# Patient Record
Sex: Female | Born: 1955 | Race: White | Hispanic: No | Marital: Married | Smoking: Never smoker
Health system: Southern US, Community
[De-identification: ages and names within clinical notes are randomized; demographics above are authoritative.]

## PROBLEM LIST (undated history)

## (undated) DIAGNOSIS — Z9989 Dependence on other enabling machines and devices: Secondary | ICD-10-CM

## (undated) DIAGNOSIS — R112 Nausea with vomiting, unspecified: Secondary | ICD-10-CM

## (undated) DIAGNOSIS — N200 Calculus of kidney: Secondary | ICD-10-CM

## (undated) DIAGNOSIS — C50919 Malignant neoplasm of unspecified site of unspecified female breast: Secondary | ICD-10-CM

## (undated) DIAGNOSIS — R7303 Prediabetes: Secondary | ICD-10-CM

## (undated) DIAGNOSIS — Z9889 Other specified postprocedural states: Secondary | ICD-10-CM

## (undated) DIAGNOSIS — I10 Essential (primary) hypertension: Secondary | ICD-10-CM

## (undated) DIAGNOSIS — E039 Hypothyroidism, unspecified: Secondary | ICD-10-CM

## (undated) DIAGNOSIS — R300 Dysuria: Secondary | ICD-10-CM

## (undated) DIAGNOSIS — Z87442 Personal history of urinary calculi: Secondary | ICD-10-CM

## (undated) DIAGNOSIS — M1990 Unspecified osteoarthritis, unspecified site: Secondary | ICD-10-CM

## (undated) DIAGNOSIS — F419 Anxiety disorder, unspecified: Secondary | ICD-10-CM

## (undated) DIAGNOSIS — E785 Hyperlipidemia, unspecified: Secondary | ICD-10-CM

## (undated) DIAGNOSIS — R3915 Urgency of urination: Secondary | ICD-10-CM

## (undated) DIAGNOSIS — G43909 Migraine, unspecified, not intractable, without status migrainosus: Secondary | ICD-10-CM

## (undated) DIAGNOSIS — Z9189 Other specified personal risk factors, not elsewhere classified: Secondary | ICD-10-CM

## (undated) HISTORY — PX: WISDOM TOOTH EXTRACTION: SHX21

## (undated) HISTORY — PX: LUMBAR FUSION: SHX111

## (undated) HISTORY — DX: Malignant neoplasm of unspecified site of unspecified female breast: C50.919

---

## 1997-01-30 HISTORY — PX: VAGINAL HYSTERECTOMY: SUR661

## 1997-09-18 ENCOUNTER — Ambulatory Visit (HOSPITAL_COMMUNITY): Admission: RE | Admit: 1997-09-18 | Discharge: 1997-09-18 | Payer: Self-pay | Admitting: Neurosurgery

## 1997-10-13 ENCOUNTER — Observation Stay (HOSPITAL_COMMUNITY): Admission: RE | Admit: 1997-10-13 | Discharge: 1997-10-14 | Payer: Self-pay | Admitting: Neurosurgery

## 1998-05-07 ENCOUNTER — Inpatient Hospital Stay (HOSPITAL_COMMUNITY): Admission: AD | Admit: 1998-05-07 | Discharge: 1998-05-10 | Payer: Self-pay | Admitting: Psychiatry

## 1998-05-07 ENCOUNTER — Observation Stay: Admission: EM | Admit: 1998-05-07 | Discharge: 1998-05-07 | Payer: Self-pay | Admitting: Emergency Medicine

## 1999-03-01 ENCOUNTER — Encounter: Payer: Self-pay | Admitting: Neurosurgery

## 1999-03-01 ENCOUNTER — Ambulatory Visit (HOSPITAL_COMMUNITY): Admission: RE | Admit: 1999-03-01 | Discharge: 1999-03-01 | Payer: Self-pay | Admitting: Neurosurgery

## 2000-03-20 ENCOUNTER — Inpatient Hospital Stay (HOSPITAL_COMMUNITY): Admission: EM | Admit: 2000-03-20 | Discharge: 2000-03-22 | Payer: Self-pay | Admitting: Emergency Medicine

## 2000-03-20 ENCOUNTER — Encounter: Payer: Self-pay | Admitting: Emergency Medicine

## 2000-03-20 HISTORY — PX: OTHER SURGICAL HISTORY: SHX169

## 2001-11-27 ENCOUNTER — Encounter (INDEPENDENT_AMBULATORY_CARE_PROVIDER_SITE_OTHER): Payer: Self-pay | Admitting: Specialist

## 2001-11-27 ENCOUNTER — Inpatient Hospital Stay (HOSPITAL_COMMUNITY): Admission: RE | Admit: 2001-11-27 | Discharge: 2001-11-28 | Payer: Self-pay | Admitting: Gynecology

## 2001-11-27 HISTORY — PX: OTHER SURGICAL HISTORY: SHX169

## 2005-03-24 ENCOUNTER — Ambulatory Visit (HOSPITAL_COMMUNITY): Admission: RE | Admit: 2005-03-24 | Discharge: 2005-03-24 | Payer: Self-pay | Admitting: Urology

## 2005-03-25 HISTORY — PX: OTHER SURGICAL HISTORY: SHX169

## 2006-06-19 ENCOUNTER — Emergency Department (HOSPITAL_COMMUNITY): Admission: EM | Admit: 2006-06-19 | Discharge: 2006-06-19 | Payer: Self-pay | Admitting: Emergency Medicine

## 2006-08-23 ENCOUNTER — Emergency Department (HOSPITAL_COMMUNITY): Admission: EM | Admit: 2006-08-23 | Discharge: 2006-08-23 | Payer: Self-pay | Admitting: Emergency Medicine

## 2010-06-17 NOTE — Op Note (Signed)
NAME:  Christie Fox, TREAT                         ACCOUNT NO.:  [id number]   MEDICAL RECORD NO.:  [id number]                   PATIENT TYPE:  INP   LOCATION:  0462                                 FACILITY:  [facility]   PHYSICIAN:  Leatha Gilding. Mezer, M.D.               DATE OF BIRTH:  03/18/1955   DATE OF PROCEDURE:  11/27/2001  DATE OF DISCHARGE:                                 OPERATIVE REPORT   PREOPERATIVE DIAGNOSIS:  Pelvic pain.   POSTOPERATIVE DIAGNOSES:  1. Pelvic pain.  2. Pelvic adhesions.   PROCEDURE:  Exploratory laparotomy and bilateral salpingo-oophorectomy.   SURGEON:  Leatha Gilding. Mezer, M.D.   ASSISTANT:  Almedia Balls. Randell Patient, M.D.   CONSULT:  Maretta Bees. Vonita Moss, M.D.   ANESTHESIA:  General endotracheal.   PREPARATION:  Betadine.   DESCRIPTION OF PROCEDURE:  With the patient in the supine position, was  prepped and draped in the routine fashion.  A Pfannenstiel incision was made  through the skin and subcutaneous tissue.  The fascia and peritoneum were  opened without difficulty.  Throughout the procedure, the quality of the  tissue was quite poor, and there was a significant amount of bleeding and  oozing that was very carefully controlled.  Upon entering the peritoneal  cavity, there was no increased peritoneal fluid.  A brief examination of her  upper abdomen was benign.  Exploration of the pelvis revealed both ovaries  to be adherent to the pelvic sidewall and drawn significantly towards the  vaginal cuff.  There were numerous permanent sutures noted in a position  that was somewhat more lateral than would be expected from sutures at  vaginal hysterectomy.  The right round ligament was suture ligated with #1  chromic and divided, and the pelvic sidewall was entered.  There was  significant scarring in the sidewall, and every bit of dissection resulted  in bleeding.  The ureter was palpated and felt to be out of harm's way.  The  infundibulopelvic ligament was  skeletonized, clamped, cut, and doubly free  tied with #1 chromic suture.  The ovary was then dissected free of the  pelvic sidewall down to the vaginal apex, and the bladder had been dissected  forward sufficiently along the course of the ovary and the scar tissue to  keep it out of harm's way.  The left round ligament was then suture ligated  with #1 chromic and divided.  This sidewall dissection was even more  difficult than on the contralateral side and, at that time, it was felt that  the benefit of determining the exact location of the ureter was likely to  cause more bleeding that would be more difficult to control and given the  fact that the ovary was high in the pelvis and that there was a clear window  of peritoneum close to the ovary where the infundibulopelvic ligament could  be isolated.  This was  accomplished.  The infundibulopelvic ligament was  clamped, cut, and doubly free tied with #1 chromic suture.  The ovary was  then dissected free of the peritoneum, again staying high on the pelvic  sidewall.  This was more densely adherent to the vaginal cuff and, again,  the bladder was dissected free to keep it from harm's way.  Throughout the  dissection, there appeared to be no entry into the bladder or into the  vagina.  The cul-de-sac was free, and the bowel was not in harm's way.  The  top of the vaginal cuff was raw and oozing.  This was gently cauterized and  then reperitonealized with interrupted figure-of-eight 2-0 chromic suture.  When the pelvic sidewalls were reinspected, hemostasis was secure on both  sides, and there was a tubular structure running parallel from the area  around the infundibulopelvic ligament through the entire length of the open  pelvic sidewall well beyond the area where the uterine artery and vein would  be located.  Whether this grossly appeared to be a venous structure, the  possibility of a previous ureteral injury and atrophic ureter was  raised.  At this point, Maretta Bees. Vonita Moss, M.D. was called in consultation.  He also  agreed that this appeared to be most likely a venous structure and completed  the sidewall dissection to definitively identify the ureter.  He also did  this on the contralateral side.  The venous structure was then ligated  proximally and distally and excised.  This was removed because there were  questions about its integrity.  The pelvis was reirrigated, hemostasis again  noted to be intact.  The appendix appeared to be normal.  The abdomen was  then closed in layers after the large bowel into the cul-de-sac.  The  omentum was brought downs.  The abdomen was closed in layers with running 2-  0 Vicryl on the peritoneum, running 0 Vicryl to the midline bilaterally on  the fascia.  Hemostasis was secured in the subcutaneous tissue, and the skin  was closed with staples.  The estimated blood loss was approximately 125 cc.  The sponge, instrument, and needle counts were correct x 2.  The patient  tolerated the procedure well and was taken to the recovery room in  satisfactory condition.                                               Leatha Gilding. Mezer, M.D.    HCM/MEDQ  D:  11/28/2001  T:  11/28/2001  Job:  [id number]   cc:   Almedia Balls. Fore, M.D.  [phone] [address] [place]  [place] [address]  Fax: [phone]   Maretta Bees. Vonita Moss, M.D.  [address], [address]  [place]  [place] [address]  Fax: [phone]   Rema Fendt  [facility]

## 2010-06-17 NOTE — Op Note (Signed)
[place]  Patient:    Christie Fox, Christie Fox                        MRN: [id number] Proc. Date: 03/20/00 Adm. Date:  03/20/00 Attending:  Loraine Leriche C. Ophelia Charter, M.D.                           Operative Report  PREOPERATIVE DIAGNOSIS:   Left ankle trimalleolar ankle fracture with mortise widening.  POSTOPERATIVE DIAGNOSIS:  OPERATION:  Open reduction and internal fixation of trimalleolar ankle fracture with medial and lateral malleolus fixation.  SURGEON:  Mark C. Ophelia Charter, M.D.  ANESTHESIA: GOT.  TOURNIQUET TIME:  Fifty minutes.  DESCRIPTION OF PROCEDURE:  After induction of general anesthesia, orotracheal intubation, postoperative Ancef given prophylactically the patient had a proximal thigh tourniquet applied, and the leg was prepped with DuraPrep up to the level of the knee.  _________ stockinette, extremity sheets and drapes were applied.  The leg was prepped with an Esmarch and tourniquet was inflated at 400.  A medial incision was made first, T-shaped, with the saphenous vein carefully mobilized.  The fracture was transverse.  This was distracted with a towel clip.  The joint was irrigated removing the hematoma and there were no bone fragments present in the joint.  Sponge was applied and then using a sterile skin marker laterally marking the skin an incision was made laterally, subperiosteal dissection on the fibula with distraction of the fracture using a towel clip.  Irrigation and removal of the fracture hematoma.  Moving back to periosteum 1 mm, an anatomic reduction with self-retaining clamp applied. Seven-hole one-third tubular plate was selected.  Distal two screws were placed with cancellous unicortical screws to avoid entering the joint. Proximally bicortical cortical screws were placed.  Interfragmentary lag screw 22 mm initially placed.  It did not compress the fracture and this was exchanged for a 24 mm screw, which did just penetrate  through the posterior inferior cortex compressing the fracture and squeezing out some blood.  The fracture was anatomic, checked under fluoroscopy, with excellent position.  A finger was introduced back behind the lateral malleolus against the distal tibia and the ankle was taken through flexion extension.  The posterior malleolar fragment was not able to be palpated and there was no motion.  It was checked under fluoroscopy, carefully rotated and the nondisplaced fracture was stressed, medial and lateral, was not mobile and appeared anatomic.  The ankle was then maximally internally rotated primarily at the hip to look at the lateral view, 180 degrees, and again the posterior malleolar fragment was anatomic and no gap was seen.  Using fluoroscopy it was difficult to even determine where the fracture line was.  Medial malleolus was then reduced, held with 062 K wire and then fixed with 3.5 lag screw.  K wire was removed and second lag screw was placed; so, the two screws were parallel, lagging the fracture, anatomically compressing it.  Entire apparatus, AP and lateral, fluoro spot films were taken for permanent record.  Posterior malleolus, again, was anatomic and it was elected not to proceed with fixation of the posterior malleolus.  After irrigation with saline solution both incisions were reapproximated with 2-0 Vicryl.  Marcaine infiltration of the skin.  Skin staple closure. Xeroform, four-by-fours and Webril.  Tourniquet was deflated prior to closure and a short-leg splint was applied, and patient was transferred to the recovery room.  Instrument count and needle count were correct. DD:  03/20/00 TD:  03/21/00 Job: [id number]

## 2010-06-17 NOTE — Op Note (Signed)
NAMEBONNY, VANLEEUWEN               ACCOUNT NO.:  [id number]   MEDICAL RECORD NO.:  [id number]          PATIENT TYPE:  AMB   LOCATION:  DAY                          FACILITY:  [facility]   PHYSICIAN:  Ronald L. Earlene Plater, M.D.  DATE OF BIRTH:  Mar 21, 1955   DATE OF PROCEDURE:  03/25/2005  DATE OF DISCHARGE:                                 OPERATIVE REPORT   DIAGNOSIS:  Left ureteral lithiasis with hydronephrosis.   OPERATIVE PROCEDURE:  Cystourethroscopy, left ureteroscopy with holmium  laser lithotripsy and basket stone extraction and placement of Polaris Loop  stent.   SURGEON:  Lucrezia Starch. Earlene Plater, M.D.   ANESTHESIA:  General endotracheal anesthesia.   ESTIMATED BLOOD LOSS:  Negligible.   TUBES:  A 24 cm 5 French Polaris Loop [employer] stent.   COMPLICATIONS:  None.   INDICATIONS FOR PROCEDURE:  Ms. Wherley is a lovely 55 year old white female  who essentially presented with left flank pain, nausea and vomiting.  She  underwent a CT scan which revealed an obstructed 6 mm calculus in the left  mid ureter with moderate to marked hydronephrosis.  The symptoms have  progressed.  All the stones have progressed down distally some, and she has  had some urgency and frequency.  After understanding risks, benefits and  alternatives, she has elected to proceed with cysto and stone manipulation.   PROCEDURE IN DETAIL:  The patient was placed in a supine position.  After  proper general endotracheal anesthesia was placed in the dorsal lithotomy  position.  Prepped and draped with Betadine in a sterile fashion.  A  cystourethroscopy was performed with a 22.5 [place] Olympus pan endoscope.  The bladder was carefully inspected and noted to be without lesions.  The  ureteral orifices were normal in location.  Under fluoroscopic vision, a  Sensicore 0.38 [place] wire was placed in the left renal pelvis.  Utilizing  the dilator of a ureteral access sheath, the lower ureter was dilated  manually.   Ureteroscopy was then performed with a short, thin ureteroscope,  and the stone was visualized just at the level of the vessels.  It was noted  to be quite large and very yellow, felt to be 8-9 mm in diameter.  Utilizing  a 365 laser fiber on a setting of 0.5 and a repetition rate of 5, the stone  was fragmented in multiple fragments, and multiple fragments were extracted  with the nitinol basket.  The lower two-thirds of the ureter was then  examined.  There were just multiple tiny fragments.  No major fragments were  remaining.  It was felt that no further manipulation was indicated.  The  ureter appeared to be totally intact.  Under fluoroscopic  guidance, a 5 French 24 cm Polaris Loop [employer] stent was placed  and noted to be in good position within the left renal pelvis within the  bladder.  The bladder was reinspected.  All fragments were extracted, and  the patient was taken to the recovery room stable.      Ronald L. Earlene Plater, M.D.  Electronically Signed     RLD/MEDQ  D:  03/24/2005  T:  03/24/2005  Job:  [id number]

## 2010-06-17 NOTE — H&P (Signed)
NAME:  Christie Fox, Christie Fox                         ACCOUNT NO.:  [id number]   MEDICAL RECORD NO.:  [id number]                   PATIENT TYPE:  INP   LOCATION:  NA                                   FACILITY:  [facility]   PHYSICIAN:  Howard C. Mezer, M.D.               DATE OF BIRTH:  [birth year]   DATE OF ADMISSION:  11/27/2001  DATE OF DISCHARGE:                                HISTORY & PHYSICAL   ADMITTING DIAGNOSES:  Pelvic pain.   HISTORY OF PRESENT ILLNESS:  The patient is a 55 year old gravida 2, para 2  woman seen in consultation courtesy of Ms. Rema Fendt of [facility] for evaluation and treatment of the patient's bilateral  pelvic pain.  The patient underwent a total vaginal hysterectomy for  bleeding in 1989.  The operative note from that surgery is not available.  The patient has had bilateral pelvic cramping since March 2003 which is  fairly constant with episodes of being quite severe.  The patient underwent  an ultrasound in April 2003 at which time a 2 x 3 cm right ovarian cyst was  reported.  A follow-up ultrasound in June 2003 was not able to locate the  right ovary.  The patient has had no dyspareunia until March 2003 and this  has progressively increased.  The patient does not want to have intercourse  secondary to the pain and stops approximately 80% of the time.  She has had  no nausea, vomiting, diarrhea, or dysuria and this pain is very different  from back pain that she has had in the past.  The patient has been seen in  consultation by Dr. Melvia Heaps who performed a colonoscopy which revealed  a single polyp in the ascending colon, but was otherwise benign.  The  patient has a strong family history of ovarian cancer with a maternal aunt  and three paternal aunts reportedly having ovarian cancer.  The patient was  asked to seek consultation at the [facility] for discussion of possible BRCA-1 and BRCA-2  testing.  After meeting  with Davonna Belling. Pernell Dupre, Dentist, the patient has elected not to  undergo this testing.  A CA-125 was obtained which was within normal range.  The patient finds the pelvic pain that she is having quite debilitating and  wishes to undergo an exploratory laparotomy and bilateral salpingo-  oophorectomy.  With no known pathology the patient understands that there is  absolutely no guarantee that this surgery will relieve her pain.  Based on  her history and the examination, it is likely to be helpful and the patient  appears to have a realistic expectation regarding the potential outcome of  the surgery.  Exploratory laparotomy and bilateral salpingo-oophorectomy  have been discussed in detail with the patient.  The potential complications  including, but not limited to, injury to the bowel,  bladder, ureter,  possible blood loss by transfusion and its sequelae, and possible infection  have been reviewed carefully.  Given the nature of the surgery, the  increased risk for ureteral injury, and increased chance for infection and  other problems in general secondary to the patient's obesity have also been  discussed.  The patient is to undergo a bowel prep preoperatively and  postoperative expectations and restrictions have been reviewed with the  patient.  Pain control has been discussed.  The patient's questions have  been thoroughly and completely answered and the patient is now admitted for  exploratory laparotomy and bilateral salpingo-oophorectomy.   PAST SURGICAL HISTORY:  Cesarean section x2, total vaginal hysterectomy, two  back surgeries, foot.   PAST MEDICAL HISTORY:  Headache.   MEDICATIONS:  Xanax and Ambien.   ALLERGIES:  SHELLFISH (the patient has had no problem with Betadine in the  past).   SOCIAL HISTORY:  Smokes none.  ETOH:  Two glasses of wine q.o.d.  The  patient is married and has two children ages 10 and 65 and is employed part-  time  at [employer].   FAMILY HISTORY:  Positive for cancer, mother breast, maternal aunt ovarian,  paternal aunts x3 ovarian cancer.   PHYSICAL EXAMINATION:  HEENT:  Negative.  NECK:  The thyroid appears to be normal.  LUNGS:  Clear.  HEART:  Without murmurs.  BREASTS:  Without masses or discharge.  ABDOMEN:  Obese, soft, and nontender.  The liver is normal size.  PELVIC:  External genitalia is normal.  The vagina is normal.  The cervix  and uterus are absent.  The adnexa are without palpable masses and the  patient states significant discomfort with pressure at the vaginal apex and  movement in all directions.  EXTREMITIES:  Negative.   IMPRESSION:  1. Pelvic pain.  2. Strong family history of ovarian cancer.  3. Obesity.   PLAN:  Exploratory laparotomy and bilateral salpingo-oophorectomy.                                               Leatha Gilding. Mezer, M.D.    HCM/MEDQ  D:  11/27/2001  T:  11/27/2001  Job:  [id number]   cc:   Rema Fendt

## 2010-06-17 NOTE — Discharge Summary (Signed)
[facility]  Patient:    TNYA, ADES                      MRN: [id number] Adm. Date:  [id number] Disc. Date: [id number] Attending:  Jacki Cones                           Discharge Summary  FINAL DIAGNOSIS:  Trimalleolar closed left ankle fracture.  ADDITIONAL DIAGNOSIS:  Migraine, depression, anxiety.  HISTORY OF PRESENT ILLNESS:  This 55 year old female was coming down the stairs after visiting her hairdresser, had a fall suffering a left displaced trimalleolar ankle fracture and was unable to ambulate.  ADMISSION MEDICATIONS: 1. ______ 2. Xanax 1 mg p.o. t.i.d. 3. Ambien 10 mg one p.o. q.h.s. 4. Paxil 20 mg p.o. daily. 5. Lipitor 1 mg p.o. daily. 6. Aceon 10 mg p.o. q.d.  ALLERGIES:  IMITREX, THORAZINE, ULTRAM, STADOL, and IVP DYE.  ADMISSION LABS:  Hemoglobin 13.6.  X-ray showed mild subluxation of the trimalleolar ankle fracture with angulation.  HOSPITAL COURSE:  Patient was admitted and, after informed consent, was taken to the operating room where she underwent open reduction and internal fixation of her trimalleolar ankle fracture.  Postoperative x-ray showed anatomic position.  All her medications including her 1 mg Xanax was restarted as before.  Postoperatively, she was seen by PT, OT, and made good progress with physical therapy, with her sedative medicine as well as her obesity.  She was ambulatory and was discharged in satisfactory condition on March 22, 2000, with office follow-up in one week.  She did stairs before she was discharged. Final diagnosis is trimalleolar ankle fracture.  Postoperative activity is weightbearing status which was touchdown weightbearing only was discussed. Home health arrangements were made for PT, also 3-in-1 commode for home use, prescription for Tylox. DD:  05/15/00 TD:  05/16/00 Job: [id number]

## 2010-06-17 NOTE — Op Note (Signed)
   NAME:  SOJOURNER, BEHRINGER                         ACCOUNT NO.:  [id number]   MEDICAL RECORD NO.:  [id number]                   PATIENT TYPE:  INP   LOCATION:  X005                                 FACILITY:  [facility]   PHYSICIAN:  Maretta Bees. Vonita Moss, M.D.             DATE OF BIRTH:  [birth year]   DATE OF PROCEDURE:  11/27/2001  DATE OF DISCHARGE:                                 OPERATIVE REPORT   REASON FOR CONSULTATION:  I was asked to scrub in and evaluate this 55-year-  old white female who is undergoing pelvic exploration with Dr. Margaretha Sheffield.  There was a linear structure in the left pelvic sidewall, was of concern as  to etiology and also wanted to make sure that the ureters were intact.   PROCEDURE:  I scrubbed in the case and the structure crossing the lateral  aspect of the pelvic incision, I believe, was a vein.  I then found the  ureters bilaterally, both of which were posterior to the operative site, and  appeared normal and peristaltsed quite nicely.  At this point there appears  no evidence in ureteral injury or problem.  I scrubbed out of the case,  which is dictated separately by Dr. Clista Bernhardt.                                               Maretta Bees. Vonita Moss, M.D.    LJP/MEDQ  D:  11/27/2001  T:  11/27/2001  Job:  [id number]   cc:   Margaretha Sheffield M.D.

## 2010-06-17 NOTE — H&P (Signed)
[place]  Patient:    Christie Fox, Christie Fox                      MRN: [id number] Proc. Date: 03/20/00 Adm. Date:  [id number] Attending:  Jacki Cones                         History and Physical  ADMISSION DIAGNOSIS: Fall, with left trimalleolar ankle fracture, with mortise shift with subluxation.  HISTORY OF PRESENT ILLNESS: This patient is a 55 year old female who was coming down steps after visiting her hairdresser and had a fall, suffering a left trimalleolar ankle fracture.  She was unable to ambulate.  PAST MEDICAL/SURGICAL HISTORY:  1. Migraines.  2. Depression.  3. Anxiety.  ALLERGIES:  1. IMITREX.  2. THORAZINE.  3. ULTRAM.  4. STADOL.  5. IVP DYE.  CURRENT MEDICATIONS:  1. Xanax 1 mg p.o. t.i.d.  2. Ambien 10 mg 1 p.o. q.h.s.  3. Paxil 20 mg p.o. q.d.  4. Lipitor 1 p.o. q.d.  5. Aceon 10 mg p.o. q.d.  REVIEW OF SYSTEMS: Positive for depression, anxiety, and migraines.  She uses Demerol and Phenergan intermittently for her migraines and has been treated by Dr. Meryl Crutch.  FAMILY HISTORY: No history of bleeding or anesthetic complications.  PHYSICAL EXAMINATION:  VITAL SIGNS: Temperature 97 degrees, pulse 86, respirations 20, blood pressure 133/88.  HEENT: No evidence of trauma.  PERRLA.  EOMI.  Pharynx clear.  NECK: Supple.  LUNGS: Clear.  HEART: Regular rate and rhythm.  ABDOMEN: Patient is obese.  Liver and spleen not palpably enlarged.  EXTREMITIES: The patient has lateral position of the ankle with subluxation on x-ray.  A transverse medial malleolar fracture is noted on x-ray inferior to the mortise and a short oblique fibular fracture consistent with supination and external rotation.  The posterior malleolus appears to be about one-fourth of the joint and slightly displaced about 1 mm.  PLAN: The patient will be taken to the operating room for open reduction and internal fixation of the medial and  lateral malleolus and possible posterior malleolus depending on stability and visualization under fluoroscopy.  The planned procedure was discussed with the patient.  The patient has had appropriate amount of IV Dilaudid due to the pain and states she is unable to transfer and does not want to consider splinting and outpatient surgery.  The patient has been NPO since early a.m.  The risks of surgery were discussed including bleeding, infection, nonunion, subluxation, displacement and reoperation.  She and her husband both understand and agree. DD:  03/20/00 TD:  03/21/00 Job: [id number]

## 2011-11-17 ENCOUNTER — Encounter: Payer: Self-pay | Admitting: Gastroenterology

## 2012-07-24 ENCOUNTER — Encounter: Payer: Self-pay | Admitting: Gastroenterology

## 2013-07-29 ENCOUNTER — Other Ambulatory Visit: Payer: Self-pay | Admitting: Urology

## 2013-07-30 ENCOUNTER — Encounter (HOSPITAL_COMMUNITY): Payer: Self-pay | Admitting: Pharmacy Technician

## 2013-07-30 ENCOUNTER — Other Ambulatory Visit (HOSPITAL_COMMUNITY): Payer: Self-pay | Admitting: Urology

## 2013-07-30 NOTE — Patient Instructions (Addendum)
Your procedure is scheduled on:  08/04/13  MONDAY  Report to [facility] at  13:55  PM   Call this number if you have problems the morning of surgery: [phone]        Do not eat food After Midnight. Sunday NIGHT--- MAY HAVE CLEAR LIQUIDS Monday MORNING UNTIL 09:45 AM-- THEN NOTHING BY MOUTH   Take these medicines the morning of surgery with A SIP OF WATER: LEVOTHYROXINE MAY HAVE ALPRAZOLAM/ OXYCODONE IF NEEDED   .  Contacts, dentures or partial plates, or metal hairpins  can not be worn to surgery. Your family will be responsible for glasses, dentures, hearing aides while you are in surgery  Leave suitcase in the car. After surgery it may be brought to your room.  For patients admitted to the hospital, checkout time is 11:00 AM day of  discharge.         [facility] IS NOT RESPONSIBLE FOR ANY VALUABLES  Patients discharged the day of surgery will not be allowed to drive home. IF going home the day of surgery, you must have a driver and someone to stay with you for the first 24 hours  Name and phone number of your driver: husband-  [facility] - Preparing for Surgery Before surgery, you can play an important role.  Because skin is not sterile, your skin needs to be as free of germs as possible.  You can reduce the number of germs on your skin by washing with CHG (chlorahexidine gluconate) soap before surgery.  CHG is an antiseptic cleaner which kills germs and bonds with the skin to continue killing germs even after washing. Please DO NOT use if you have an allergy to CHG or antibacterial soaps.  If your skin becomes reddened/irritated stop using the CHG and inform your nurse when you arrive at Short Stay. Do not shave (including legs and underarms) for at least 48 hours  prior to the first CHG shower.  You may shave your face/neck. Please follow these instructions carefully:  1.  Shower with CHG Soap the night before surgery and the  morning of Surgery.  2.  If you choose to wash your hair, wash your hair first as usual with your  normal  shampoo.  3.  After you shampoo, rinse your hair and body thoroughly to remove the  shampoo.                           4.  Use CHG as you would any other liquid soap.  You can apply chg directly  to the skin and wash  Gently with a scrungie or clean washcloth.  5.  Apply the CHG Soap to your body ONLY FROM THE NECK DOWN.   Do not use on face/ open                           Wound or open sores. Avoid contact with eyes, ears mouth and genitals (private parts).                       Wash face,  Genitals (private parts) with your normal soap.             6.  Wash thoroughly, paying special attention to the area where your surgery  will be performed.  7.  Thoroughly rinse your body with warm water from the neck down.  8.  DO NOT shower/wash with your normal soap after using and rinsing off  the CHG Soap.                9.  Pat yourself dry with a clean towel.            10.  Wear clean pajamas.            11.  Place clean sheets on your bed the night of your first shower and do not  sleep with pets. Day of Surgery : Do not apply any lotions/deodorants the morning of surgery.  Please wear clean clothes to the hospital/surgery center.  FAILURE TO FOLLOW THESE INSTRUCTIONS MAY RESULT IN THE CANCELLATION OF YOUR SURGERY PATIENT SIGNATURE_________________________________  NURSE SIGNATURE__________________________________  ________________________________________________________________________    CLEAR LIQUID DIET   Foods Allowed                                                                     Foods Excluded  Coffee and tea, regular and decaf                             liquids that you cannot  Plain  Jell-O in any flavor                                             see through such as: Fruit ices (not with fruit pulp)                                     milk, soups, orange juice  Iced Popsicles                                    All solid food Carbonated beverages, regular and diet                                    Cranberry, grape and apple juices Sports drinks like Gatorade Lightly seasoned clear broth or consume(fat free) Sugar, honey syrup  Sample Menu Breakfast                                Lunch                                     Supper Cranberry juice                    Beef broth                            Chicken broth Jell-O                                     Grape juice                           Apple juice Coffee or tea                        Jell-O                                      Popsicle                                                Coffee or tea                        Coffee or tea  _____________________________________________________________________

## 2013-07-31 ENCOUNTER — Ambulatory Visit (HOSPITAL_COMMUNITY)
Admission: RE | Admit: 2013-07-31 | Discharge: 2013-07-31 | Disposition: A | Discharge status: Home / Self Care | Payer: BC Managed Care – PPO | Source: Ambulatory Visit | Attending: Anesthesiology | Admitting: Anesthesiology

## 2013-07-31 ENCOUNTER — Encounter (HOSPITAL_COMMUNITY): Payer: Self-pay

## 2013-07-31 ENCOUNTER — Encounter (HOSPITAL_COMMUNITY)
Admission: RE | Admit: 2013-07-31 | Discharge: 2013-07-31 | Disposition: A | Discharge status: Home / Self Care | Payer: BC Managed Care – PPO | Source: Ambulatory Visit | Attending: Urology | Admitting: Urology

## 2013-07-31 DIAGNOSIS — Z01810 Encounter for preprocedural cardiovascular examination: Secondary | ICD-10-CM | POA: Insufficient documentation

## 2013-07-31 DIAGNOSIS — Z01818 Encounter for other preprocedural examination: Secondary | ICD-10-CM | POA: Insufficient documentation

## 2013-07-31 HISTORY — DX: Anxiety disorder, unspecified: F41.9

## 2013-07-31 HISTORY — DX: Essential (primary) hypertension: I10

## 2013-07-31 HISTORY — DX: Hypothyroidism, unspecified: E03.9

## 2013-07-31 HISTORY — DX: Unspecified osteoarthritis, unspecified site: M19.90

## 2013-07-31 HISTORY — DX: Other specified postprocedural states: Z98.890

## 2013-07-31 HISTORY — DX: Nausea with vomiting, unspecified: R11.2

## 2013-07-31 LAB — BASIC METABOLIC PANEL
ANION GAP: 12 (ref 5–15)
BUN: 9 mg/dL (ref 6–23)
CHLORIDE: 101 meq/L (ref 96–112)
CO2: 29 mEq/L (ref 19–32)
Calcium: 9.5 mg/dL (ref 8.4–10.5)
Creatinine, Ser: 0.55 mg/dL (ref 0.50–1.10)
Glucose, Bld: 107 mg/dL — ABNORMAL HIGH (ref 70–99)
POTASSIUM: 4.7 meq/L (ref 3.7–5.3)
SODIUM: 142 meq/L (ref 137–147)

## 2013-07-31 LAB — CBC
HCT: 40.8 % (ref 36.0–46.0)
HEMOGLOBIN: 13.3 g/dL (ref 12.0–15.0)
MCH: 30.5 pg (ref 26.0–34.0)
MCHC: 32.6 g/dL (ref 30.0–36.0)
MCV: 93.6 fL (ref 78.0–100.0)
PLATELETS: 308 10*3/uL (ref 150–400)
RBC: 4.36 MIL/uL (ref 3.87–5.11)
RDW: 12.8 % (ref 11.5–15.5)
WBC: 7.3 10*3/uL (ref 4.0–10.5)

## 2013-07-31 NOTE — Progress Notes (Signed)
07/31/13 1323  OBSTRUCTIVE SLEEP APNEA  Have you ever been diagnosed with sleep apnea through a sleep study? No  Do you snore loudly (loud enough to be heard through closed doors)?  0  Do you often feel tired, fatigued, or sleepy during the daytime? 0  Has anyone observed you stop breathing during your sleep? 0  Do you have, or are you being treated for high blood pressure? 1  BMI more than 35 kg/m2? 1  Age over 60 years old? 1  Neck circumference greater than 40 cm/16 inches? 1  Gender: 0  Obstructive Sleep Apnea Score 4  Score 4 or greater  Results sent to PCP

## 2013-08-03 MED ORDER — GENTAMICIN SULFATE 40 MG/ML IJ SOLN
400.0000 mg | INTRAVENOUS | Status: AC
  Administered 2013-08-04: 400 mg via INTRAVENOUS
  Filled 2013-08-03: qty 10

## 2013-08-04 ENCOUNTER — Encounter (HOSPITAL_COMMUNITY)
Admission: RE | Disposition: A | Discharge status: Home / Self Care | Payer: Self-pay | Source: Ambulatory Visit | Attending: Urology

## 2013-08-04 ENCOUNTER — Ambulatory Visit (HOSPITAL_COMMUNITY): Payer: BC Managed Care – PPO | Admitting: Certified Registered Nurse Anesthetist

## 2013-08-04 ENCOUNTER — Encounter (HOSPITAL_COMMUNITY): Payer: Self-pay | Admitting: Certified Registered"

## 2013-08-04 ENCOUNTER — Encounter (HOSPITAL_COMMUNITY): Payer: BC Managed Care – PPO | Admitting: Certified Registered Nurse Anesthetist

## 2013-08-04 ENCOUNTER — Ambulatory Visit (HOSPITAL_COMMUNITY)
Admission: RE | Admit: 2013-08-04 | Discharge: 2013-08-04 | Disposition: A | Discharge status: Home / Self Care | Payer: BC Managed Care – PPO | Source: Ambulatory Visit | Attending: Urology | Admitting: Urology

## 2013-08-04 DIAGNOSIS — N200 Calculus of kidney: Secondary | ICD-10-CM | POA: Insufficient documentation

## 2013-08-04 DIAGNOSIS — Z6841 Body Mass Index (BMI) 40.0 and over, adult: Secondary | ICD-10-CM | POA: Insufficient documentation

## 2013-08-04 DIAGNOSIS — E039 Hypothyroidism, unspecified: Secondary | ICD-10-CM | POA: Insufficient documentation

## 2013-08-04 DIAGNOSIS — I10 Essential (primary) hypertension: Secondary | ICD-10-CM | POA: Insufficient documentation

## 2013-08-04 HISTORY — PX: HOLMIUM LASER APPLICATION: SHX5852

## 2013-08-04 HISTORY — PX: CYSTOSCOPY WITH RETROGRADE PYELOGRAM, URETEROSCOPY AND STENT PLACEMENT: SHX5789

## 2013-08-04 SURGERY — CYSTOURETEROSCOPY, WITH RETROGRADE PYELOGRAM AND STENT INSERTION
Anesthesia: General | Site: Ureter | Laterality: Left

## 2013-08-04 MED ORDER — PROPOFOL 10 MG/ML IV BOLUS
INTRAVENOUS | Status: DC | PRN
  Administered 2013-08-04: 200 mg via INTRAVENOUS

## 2013-08-04 MED ORDER — 0.9 % SODIUM CHLORIDE (POUR BTL) OPTIME
TOPICAL | Status: DC | PRN
  Administered 2013-08-04: 1000 mL

## 2013-08-04 MED ORDER — FENTANYL CITRATE 0.05 MG/ML IJ SOLN
INTRAMUSCULAR | Status: AC
  Filled 2013-08-04: qty 5

## 2013-08-04 MED ORDER — SULFAMETHOXAZOLE-TMP DS 800-160 MG PO TABS: 1.0000 | ORAL_TABLET | Freq: Two times a day (BID) | ORAL | Status: DC

## 2013-08-04 MED ORDER — OXYCODONE-ACETAMINOPHEN 5-325 MG PO TABS
1.0000 | ORAL_TABLET | Freq: Four times a day (QID) | ORAL | Status: DC | PRN
  Administered 2013-08-04: 1 via ORAL
  Filled 2013-08-04: qty 1

## 2013-08-04 MED ORDER — PROPOFOL 10 MG/ML IV BOLUS
INTRAVENOUS | Status: AC
  Filled 2013-08-04: qty 20

## 2013-08-04 MED ORDER — DEXAMETHASONE SODIUM PHOSPHATE 10 MG/ML IJ SOLN
INTRAMUSCULAR | Status: DC | PRN
  Administered 2013-08-04: 10 mg via INTRAVENOUS

## 2013-08-04 MED ORDER — DEXAMETHASONE SODIUM PHOSPHATE 10 MG/ML IJ SOLN
INTRAMUSCULAR | Status: AC
  Filled 2013-08-04: qty 1

## 2013-08-04 MED ORDER — FENTANYL CITRATE 0.05 MG/ML IJ SOLN
25.0000 ug | INTRAMUSCULAR | Status: DC | PRN
  Administered 2013-08-04: 50 ug via INTRAVENOUS

## 2013-08-04 MED ORDER — PROMETHAZINE HCL 25 MG/ML IJ SOLN: 6.2500 mg | INTRAMUSCULAR | Status: DC | PRN

## 2013-08-04 MED ORDER — FENTANYL CITRATE 0.05 MG/ML IJ SOLN
INTRAMUSCULAR | Status: AC
  Filled 2013-08-04: qty 2

## 2013-08-04 MED ORDER — OXYCODONE-ACETAMINOPHEN 5-325 MG PO TABS: 1.0000 | ORAL_TABLET | Freq: Four times a day (QID) | ORAL | Status: DC | PRN

## 2013-08-04 MED ORDER — MIDAZOLAM HCL 5 MG/5ML IJ SOLN
INTRAMUSCULAR | Status: DC | PRN
  Administered 2013-08-04: 2 mg via INTRAVENOUS

## 2013-08-04 MED ORDER — ONDANSETRON HCL 4 MG/2ML IJ SOLN
INTRAMUSCULAR | Status: DC | PRN
  Administered 2013-08-04: 4 mg via INTRAVENOUS

## 2013-08-04 MED ORDER — SODIUM CHLORIDE 0.9 % IR SOLN
Status: DC | PRN
  Administered 2013-08-04: 3000 mL via INTRAVESICAL

## 2013-08-04 MED ORDER — KETOROLAC TROMETHAMINE 30 MG/ML IJ SOLN
15.0000 mg | Freq: Once | INTRAMUSCULAR | Status: AC | PRN
  Administered 2013-08-04: 30 mg via INTRAVENOUS

## 2013-08-04 MED ORDER — MIDAZOLAM HCL 2 MG/2ML IJ SOLN
INTRAMUSCULAR | Status: AC
  Filled 2013-08-04: qty 2

## 2013-08-04 MED ORDER — LIDOCAINE HCL (CARDIAC) 20 MG/ML IV SOLN
INTRAVENOUS | Status: AC
  Filled 2013-08-04: qty 5

## 2013-08-04 MED ORDER — LACTATED RINGERS IV SOLN
INTRAVENOUS | Status: DC
  Administered 2013-08-04: 1000 mL via INTRAVENOUS

## 2013-08-04 MED ORDER — LIDOCAINE HCL (CARDIAC) 20 MG/ML IV SOLN
INTRAVENOUS | Status: DC | PRN
  Administered 2013-08-04: 50 mg via INTRAVENOUS

## 2013-08-04 MED ORDER — FENTANYL CITRATE 0.05 MG/ML IJ SOLN
INTRAMUSCULAR | Status: DC | PRN
Start: 2013-08-04 — End: 2013-08-04
  Administered 2013-08-04 (×2): 50 ug via INTRAVENOUS

## 2013-08-04 MED ORDER — IOHEXOL 300 MG/ML  SOLN
INTRAMUSCULAR | Status: DC | PRN
  Administered 2013-08-04: 10 mL via ORAL

## 2013-08-04 MED ORDER — OXYBUTYNIN CHLORIDE 5 MG PO TABS: 5.0000 mg | ORAL_TABLET | Freq: Three times a day (TID) | ORAL | Status: DC | PRN

## 2013-08-04 MED ORDER — ONDANSETRON HCL 4 MG/2ML IJ SOLN
INTRAMUSCULAR | Status: AC
  Filled 2013-08-04: qty 2

## 2013-08-04 MED ORDER — KETOROLAC TROMETHAMINE 30 MG/ML IJ SOLN
INTRAMUSCULAR | Status: AC
  Filled 2013-08-04: qty 1

## 2013-08-04 SURGICAL SUPPLY — 24 items
BAG URINE DRAINAGE (UROLOGICAL SUPPLIES) ×3 IMPLANT
BASKET LASER NITINOL 1.9FR (BASKET) ×3 IMPLANT
BASKET STNLS GEMINI 4WIRE 3FR (BASKET) IMPLANT
BASKET ZERO TIP NITINOL 2.4FR (BASKET) IMPLANT
CATH INTERMIT  6FR 70CM (CATHETERS) ×3 IMPLANT
CLOTH BEACON ORANGE TIMEOUT ST (SAFETY) ×3 IMPLANT
DRAPE CAMERA CLOSED 9X96 (DRAPES) ×3 IMPLANT
ELECT REM PT RETURN 9FT ADLT (ELECTROSURGICAL)
ELECTRODE REM PT RTRN 9FT ADLT (ELECTROSURGICAL) IMPLANT
FIBER LASER FLEXIVA 200 (UROLOGICAL SUPPLIES) ×3 IMPLANT
FIBER LASER FLEXIVA 365 (UROLOGICAL SUPPLIES) IMPLANT
GLOVE BIOGEL M STRL SZ7.5 (GLOVE) ×3 IMPLANT
GOWN STRL REUS W/TWL LRG LVL3 (GOWN DISPOSABLE) ×3 IMPLANT
GUIDEWIRE ANG ZIPWIRE 038X150 (WIRE) ×3 IMPLANT
GUIDEWIRE STR DUAL SENSOR (WIRE) ×3 IMPLANT
IV NS 1000ML (IV SOLUTION) ×6
IV NS 1000ML BAXH (IV SOLUTION) ×3 IMPLANT
IV NS IRRIG 3000ML ARTHROMATIC (IV SOLUTION) ×3 IMPLANT
PACK CYSTO (CUSTOM PROCEDURE TRAY) ×3 IMPLANT
SHEATH ACCESS URETERAL 24CM (SHEATH) ×3 IMPLANT
STENT POLARIS 5FRX22 (STENTS) ×3 IMPLANT
SYRINGE 12CC LL (MISCELLANEOUS) IMPLANT
SYRINGE IRR TOOMEY STRL 70CC (SYRINGE) IMPLANT
TUBE FEEDING 8FR 16IN STR KANG (MISCELLANEOUS) ×3 IMPLANT

## 2013-08-04 NOTE — Brief Op Note (Signed)
08/04/2013  5:25 PM  PATIENT:  Christie Fox  58 y.o. female  PRE-OPERATIVE DIAGNOSIS:  LEFT RENAL STONES, FLANK PAIN  POST-OPERATIVE DIAGNOSIS:  LEFT RENAL STONES, FLANK PAIN  PROCEDURE:  Procedure(s): 1ST STAGE CYSTOSCOPY WITH RETROGRADE PYELOGRAM, URETEROSCOPY AND STENT PLACEMENT (Left) HOLMIUM LASER APPLICATION (Left)  SURGEON:  Surgeon(s) and Role:    * Alexis Frock, MD - Primary  PHYSICIAN ASSISTANT:   ASSISTANTS: none   ANESTHESIA:   general  EBL:     BLOOD ADMINISTERED:none  DRAINS: none   LOCAL MEDICATIONS USED:  NONE  SPECIMEN:  Source of Specimen:  Left Renal Stone Fragments  DISPOSITION OF SPECIMEN:  [facility] for compositional analysis  COUNTS:  YES  TOURNIQUET:  * No tourniquets in log *  DICTATION: .Other Dictation: Dictation Number [id number]  PLAN OF CARE: Discharge to home after PACU  PATIENT DISPOSITION:  PACU - hemodynamically stable.   Delay start of Pharmacological VTE agent (>24hrs) due to surgical blood loss or risk of bleeding: not applicable

## 2013-08-04 NOTE — Discharge Instructions (Signed)
1 - You may have urinary urgency (bladder spasms) and bloody urine on / off with stent in place. This is normal. ° °2 - Call MD or go to ER for fever >102, severe pain / nausea / vomiting not relieved by medications, or acute change in medical status ° °

## 2013-08-04 NOTE — Transfer of Care (Signed)
Immediate Anesthesia Transfer of Care Note  Patient: Christie Fox  Procedure(s) Performed: Procedure(s) (LRB): 1ST STAGE CYSTOSCOPY WITH RETROGRADE PYELOGRAM, URETEROSCOPY AND STENT PLACEMENT (Left) HOLMIUM LASER APPLICATION (Left)  Patient Location: PACU  Anesthesia Type: General  Level of Consciousness: sedated, patient cooperative and responds to stimulation  Airway & Oxygen Therapy: Patient Spontanous Breathing and Patient connected to face mask oxgen  Post-op Assessment: Report given to PACU RN and Post -op Vital signs reviewed and stable  Post vital signs: Reviewed and stable  Complications: No apparent anesthesia complications

## 2013-08-04 NOTE — H&P (Signed)
Christie Fox is an 58 y.o. female.    Chief Complaint: Pre-OP Left First Stage Ureteroscopic Stone Manipulation  HPI:   1 - Recurrent Nephrolithiasis -  Pre 2015 - MET x several, URS x 1 on left side 06/2013 - 2cm left intrarenal stone w/o hydro on CT for eval colickly flank pain. Stones 500 HU, SSD 13 cm.   2 - Medical Stone Disease -  Eval 2015: BMP,PTH,Urate - today / pending; Composition - pending; 24 Hr Urines - pending  3 - Chronic Bacteruria - recent UCX 6/ 2015 pan-sensitive e. coli, low growth, no fevers, treated with Cipro. F/U CX's still with non-specific non-clonal growth. No fevers.  NO hydro on recent imaging. Has been on Bactrim peri-op to help reduce likely chronic stone colonization.   PMH sig for back surgery x several, benign hyst, obesity, HTN. No CV disease, no strong blood thinners.  Today Christie Fox is seen to proceed with left first stage ureteroscopic stone manipulation.  Her pain has been difficult to control requiring narcotics and calling on-call MD x several over the weekend.   Past Medical History  Diagnosis Date  . Christie Fox (postoperative nausea and vomiting)   . Hypertension   . Hypothyroidism   . Anxiety   . Kidney stones   . Headache(784.0)     migraines  . Arthritis     knees    Past Surgical History  Procedure Laterality Date  . Abdominal hysterectomy    . Bilateral salpingectomy      with oophorectomy  . Lithotripsy    . Cystoscopy    . Back surgery      x 2,lumbar/  fusion  . Foot fracture surgery Left     retained lateral plate and screws, screws midial ankle x 2  . Cesarean section      x 2  . Wisdom tooth extraction      No family history on file. Social History:  reports that she has never smoked. She has never used smokeless tobacco. She reports that she drinks alcohol. She reports that she does not use illicit drugs.  Allergies:  Allergies  Allergen Reactions  . Imitrex [Sumatriptan] Swelling    Shortness of breath    No  prescriptions prior to admission    No results found for this or any previous visit (from the past 48 hour(s)). No results found.  Review of Systems  Constitutional: Negative.  Negative for fever and chills.  HENT: Negative.   Eyes: Negative.   Respiratory: Negative.   Cardiovascular: Negative.   Gastrointestinal: Positive for nausea. Negative for vomiting.  Genitourinary: Positive for flank pain.  Musculoskeletal: Negative.   Skin: Negative.   Neurological: Negative.   Endo/Heme/Allergies: Negative.   Psychiatric/Behavioral: Negative.     There were no vitals taken for this visit. Physical Exam  Constitutional: She is oriented to person, place, and time. She appears well-developed and well-nourished.  HENT:  Head: Normocephalic and atraumatic.  Eyes: EOM are normal. Pupils are equal, round, and reactive to light.  Neck: Normal range of motion. Neck supple.  Cardiovascular: Normal rate and regular rhythm.   Respiratory: Effort normal.  GI: Soft. Bowel sounds are normal.  Genitourinary:  Moderate Left CVAT  Musculoskeletal: Normal range of motion.  Neurological: She is alert and oriented to person, place, and time.  Skin: Skin is warm and dry.  Psychiatric: She has a normal mood and affect. Judgment and thought content normal.     Assessment/Plan   1 -  Recurrent Nephrolithiasis - Current stone burden may or may not be causing her pain, but is large volume and ceratinely carres some risk as nidues for recurrent UTI and progressive obstruciton. Prob best treated with staged URS v. single PCNL. After careful consideration, the patient has elected to undergo stages URS. Plan for two stages, 64mn each, approx 1-2 weeks apart.   We rediscussed ureteroscopic stone manipulation with basketing and laser-lithotripsy in detail.  We rediscussed risks including bleeding, infection, damage to kidney / ureter  bladder, rarely loss of kidney. We rediscussed anesthetic risks and rare but  serious surgical complications including DVT, PE, MI, and mortality. We specifically readdressed that in 5-10% of cases a staged approach is required with stenting followed by re-attempt ureteroscopy if anatomy unfavorable. The patient voiced understanding and wises to proceed today as planned.  Will have interval stent between stages.    2 - Medical Stone Disease - blood eval complete, compositoina dn 24 hr urines pneidng.   3 - Chronic Bacteruria - Afebrile, no hydro. On peri-o pABX to reduce colonization. Despite these maneuvers she is certainly at some risk of peri-op infection and this was explained as was plan for post-op admission for IV ABX should fevers develop or changing procedure today to stent only if any fevers develop intra-op.    Christie Fox 08/04/2013, 6:23 AM

## 2013-08-04 NOTE — Anesthesia Preprocedure Evaluation (Addendum)
Anesthesia Evaluation  Patient identified by MRN, date of birth, ID band Patient awake    Reviewed: Allergy & Precautions, H&P , NPO status , Patient's Chart, lab work & pertinent test results  Airway Mallampati: III TM Distance: <3 FB Neck ROM: Full    Dental no notable dental hx.    Pulmonary neg pulmonary ROS,  breath sounds clear to auscultation  + decreased breath sounds      Cardiovascular hypertension, Rhythm:Regular Rate:Normal     Neuro/Psych negative neurological ROS  negative psych ROS   GI/Hepatic negative GI ROS, Neg liver ROS,   Endo/Other  Hypothyroidism Morbid obesity  Renal/GU negative Renal ROS  negative genitourinary   Musculoskeletal negative musculoskeletal ROS (+)   Abdominal   Peds negative pediatric ROS (+)  Hematology negative hematology ROS (+)   Anesthesia Other Findings   Reproductive/Obstetrics negative OB ROS                          Anesthesia Physical Anesthesia Plan  ASA: III  Anesthesia Plan: General   Post-op Pain Management:    Induction: Intravenous  Airway Management Planned: LMA  Additional Equipment:   Intra-op Plan:   Post-operative Plan: Extubation in OR  Informed Consent: I have reviewed the patients History and Physical, chart, labs and discussed the procedure including the risks, benefits and alternatives for the proposed anesthesia with the patient or authorized representative who has indicated his/her understanding and acceptance.   Dental advisory given  Plan Discussed with: CRNA and Surgeon  Anesthesia Plan Comments:        Anesthesia Quick Evaluation

## 2013-08-04 NOTE — Anesthesia Postprocedure Evaluation (Signed)
  Anesthesia Post-op Note  Patient: Christie Fox  Procedure(s) Performed: Procedure(s) (LRB): 1ST STAGE CYSTOSCOPY WITH RETROGRADE PYELOGRAM, URETEROSCOPY AND STENT PLACEMENT (Left) HOLMIUM LASER APPLICATION (Left)  Patient Location: PACU  Anesthesia Type: General  Level of Consciousness: awake and alert   Airway and Oxygen Therapy: Patient Spontanous Breathing  Post-op Pain: mild  Post-op Assessment: Post-op Vital signs reviewed, Patient's Cardiovascular Status Stable, Respiratory Function Stable, Patent Airway and No signs of Nausea or vomiting  Last Vitals:  Filed Vitals:   08/04/13 1732  BP: 129/76  Pulse: 108  Temp: 36.7 C  Resp: 12    Post-op Vital Signs: stable   Complications: No apparent anesthesia complications

## 2013-08-05 ENCOUNTER — Encounter (HOSPITAL_COMMUNITY): Payer: Self-pay | Admitting: Urology

## 2013-08-05 NOTE — Op Note (Deleted)
[name], Fox               ACCOUNT NO.:  [id number]  MEDICAL RECORD NO.:  [id number]  LOCATION:                                 FACILITY:  PHYSICIAN:  Alexis Frock, MD     DATE OF BIRTH:  1955-08-24  DATE OF PROCEDURE:  08/04/2013 DATE OF DISCHARGE:  08/04/2013                              OPERATIVE REPORT   DIAGNOSIS:  Large left upper pole stone.  PROCEDURE: 1. Cystoscopy with left retrograde pyelogram interpretation. 2. First-stage left ureteroscopy with laser lithotripsy. 3. Placement of left ureteral stent, 5 x 22 Polaris, no tether.  ESTIMATED BLOOD LOSS:  Nil.  COMPLICATIONS:  None.  SPECIMEN:  Left renal stone fragments for compositional analysis.  FINDINGS: 1. A large partially branched left upper pole stone with mild left     upper pole infundibular intrarenal hydronephrosis. 2. Ablation with approximately 65% of stone material today.  INDICATION:  Christie Fox is a pleasant 58 year old lady, who on workup of a colicky left-sided abdominal flank pain was found to have a large upper pole left renal stone without significant hydronephrosis, but some questionable obstruction selectively of the left upper pole.  Options were discussed for management, including possible first stage percutaneous nephrostolithotomy versus multi-stage retrograde ureteroscopic approach, and she wished to proceed with the latter. Informed consent was obtained and placed in the medical record. Notably, I had extensive discussion preoperatively, given the configuration and location of her stone that may or may not be the culprit for her left-sided pain.  She also has had chronic bacteriuria and has been on culture specific antibiotics to decrease her bacterial load in preparation for surgery today.  PROCEDURE IN DETAIL:  The patient being Christie Fox, procedure being left first stage ureteroscopic stone manipulation was confirmed. Procedure was carried out.  Time-out was  performed.  Intravenous antibiotics administered.  General LMA anesthesia was induced. The patient was placed into a low lithotomy position.  Sterile field was created prepping and draping the patient's vagina, introitus, and proximal thighs using iodine x3.  Next, cystourethroscopy was performed using a 22-French rigid cystoscope with 12-degree offset lens, inspection of the urinary bladder revealed no diverticula, calcifications or papillary lesions. The left ureteral orifice was cannulated with a 6-French end-hole catheter and left retrograde pyelogram was obtained.  Left retrograde pyelogram demonstrates a single left ureter with single system left kidney, that was somewhat bifid in orientation with a separate upper pole, very narrow infundibulum with a filling defect in the upper pole and infundibulum, consistent with known left branch upper pole stone.  A 0.03 Glidewire was advanced at the level of renal pelvis and set aside as a safety wire.  An 8-French feeding tube was placed in urinary bladder for pressure release.  Next, semi-rigid ureteroscopy was performed of the entire length of left ureter alongside a separate Sensor working wire and no mucosal abnormalities or calcifications were found.  Next, the semi-rigid ureteroscope was exchanged for a 12/14, 24 cm ureteral access sheath over the Sensor working wire to the level the proximal ureter.  Next, flexible digital ureteroscopy performed using 8- [place] digital ureteroscope, which allowed panendoscopic examination of left kidney.  As expected, the  only stone that was found was a large partially branched upper pole stone in the narrow infundibulum coursing to the upper pole.  This stone appeared to be much too large for simple basketing.  As such, holmium laser energy was applied to the stone using settings of 0.2 joules and 20 hertz in a dusting technique, ablating approximately 60% to 70% of the stone volume.  Dominant  fragments of this were then grasped with escape basket and brought out in their entirety and set aside for compositional analysis.  This was performed until visualization was suboptimal due to the large amount of fragments generated inherently managing this large stone with ureteroscopic technique.  Given staged approach was planned, decision made to conclude this portion the procedure today.  As such, the sheath was removed under continuous fluoroscopic vision, no mucosal abnormalities were found. Finally, a new 5 x 22 Polaris __________ was placed with the remaining safety wire at the level of the upper pole.  This was placed fluoroscopically with good proximal and distal curl noted.  Bladder was emptied per cystoscope. Procedure was then terminated. The patient tolerated the procedure well.  There were no immediate periprocedural complications.  The patient was taken to postanesthesia care unit in stable condition.          ______________________________ Alexis Frock, MD     TM/MEDQ  D:  08/04/2013  T:  08/05/2013  Job:  [id number]

## 2013-08-05 NOTE — Op Note (Signed)
Christie Fox, Christie Fox               ACCOUNT NO.:  [id number]  MEDICAL RECORD NO.:  [id number]  LOCATION:                                 FACILITY:  PHYSICIAN:  Alexis Frock, MD     DATE OF BIRTH:  10-08-1955  DATE OF PROCEDURE:  08/04/2013 DATE OF DISCHARGE:  08/04/2013                              OPERATIVE REPORT   DIAGNOSIS:  Large left upper pole stone.  PROCEDURE: 1. Cystoscopy with left retrograde pyelogram interpretation. 2. First-stage left ureteroscopy with laser lithotripsy. 3. Placement of left ureteral stent, 5 x 22 Polaris, no tether.  ESTIMATED BLOOD LOSS:  Nil.  COMPLICATIONS:  None.  SPECIMEN:  Left renal stone fragments for compositional analysis.  FINDINGS: 1. A large partially branched left upper pole stone with mild left     upper pole infundibular intrarenal hydronephrosis. 2. Ablation with approximately 65% of stone material today.  INDICATION:  Christie Fox is a pleasant 58 year old lady, who on workup of a colicky left-sided abdominal flank pain was found to have a large upper pole left renal stone without significant hydronephrosis, but some questionable obstruction selectively of the left upper pole.  Options were discussed for management, including possible first stage percutaneous nephrostolithotomy versus multi-stage retrograde ureteroscopic approach, and she wished to proceed with the latter. Informed consent was obtained and placed in the medical record. Notably, I had extensive discussion preoperatively, given the configuration and location of her stone that may or may not be the culprit for her left-sided pain.  She also has had chronic bacteriuria and has been on culture specific antibiotics to decrease her bacterial load in preparation for surgery today.  PROCEDURE IN DETAIL:  The patient being Christie Fox, procedure being left first stage ureteroscopic stone manipulation was confirmed. Procedure was carried out.  Time-out was  performed.  Intravenous antibiotics administered.  General LMA anesthesia was induced. The patient was placed into a low lithotomy position.  Sterile field was created prepping and draping the patient's vagina, introitus, and proximal thighs using iodine x3.  Next, cystourethroscopy was performed using a 22-French rigid cystoscope with 12-degree offset lens, inspection of the urinary bladder revealed no diverticula, calcifications or papillary lesions. The left ureteral orifice was cannulated with a 6-French end-hole catheter and left retrograde pyelogram was obtained.  Left retrograde pyelogram demonstrates a single left ureter with single system left kidney, that was somewhat bifid in orientation with a separate upper pole, very narrow infundibulum with a filling defect in the upper pole and infundibulum, consistent with known left branch upper pole stone.  A 0.03 Glidewire was advanced at the level of renal pelvis and set aside as a safety wire.  An 8-French feeding tube was placed in urinary bladder for pressure release.  Next, semi-rigid ureteroscopy was performed of the entire length of left ureter alongside a separate Sensor working wire and no mucosal abnormalities or calcifications were found.  Next, the semi-rigid ureteroscope was exchanged for a 12/14, 24 cm ureteral access sheath over the Sensor working wire to the level the proximal ureter.  Next, flexible digital ureteroscopy performed using 8- [place] digital ureteroscope, which allowed panendoscopic examination of left kidney.  As expected, the  only stone that was found was a large partially branched upper pole stone in the narrow infundibulum coursing to the upper pole.  This stone appeared to be much too large for simple basketing.  As such, holmium laser energy was applied to the stone using settings of 0.2 joules and 20 hertz in a dusting technique, ablating approximately 60% to 70% of the stone volume.  Dominant  fragments of this were then grasped with escape basket and brought out in their entirety and set aside for compositional analysis.  This was performed until visualization was suboptimal due to the large amount of fragments generated inherently managing this large stone with ureteroscopic technique.  Given staged approach was planned, decision made to conclude this portion the procedure today.  As such, the sheath was removed under continuous fluoroscopic vision, no mucosal abnormalities were found. Finally, a new 5 x 22 Polaris stent was placed with the remaining safety wire at the level of the upper pole.  This was placed fluoroscopically with good proximal and distal curl noted.  Bladder was emptied per cystoscope. Procedure was then terminated. The patient tolerated the procedure well.  There were no immediate periprocedural complications.  The patient was taken to postanesthesia care unit in stable condition.          ______________________________ Alexis Frock, MD     TM/MEDQ  D:  08/04/2013  T:  08/05/2013  Job:  [id number]

## 2013-08-12 ENCOUNTER — Encounter (HOSPITAL_BASED_OUTPATIENT_CLINIC_OR_DEPARTMENT_OTHER): Payer: Self-pay | Admitting: *Deleted

## 2013-08-13 ENCOUNTER — Encounter (HOSPITAL_BASED_OUTPATIENT_CLINIC_OR_DEPARTMENT_OTHER): Payer: Self-pay | Admitting: *Deleted

## 2013-08-13 ENCOUNTER — Other Ambulatory Visit: Payer: Self-pay | Admitting: Urology

## 2013-08-13 NOTE — Progress Notes (Signed)
NPO AFTER MN. ARRIVE AT 0700. CURRENT LAB RESULTS AND EKG IN CHART AND EPIC. WILL TAKE XANAX, CRESTOR, AND SYNTHROID AM DOS W/ SIPS OF WATER AND IF NEEDED TAKE TRAMADOL.

## 2013-08-20 ENCOUNTER — Ambulatory Visit (HOSPITAL_BASED_OUTPATIENT_CLINIC_OR_DEPARTMENT_OTHER): Payer: BC Managed Care – PPO | Admitting: Anesthesiology

## 2013-08-20 ENCOUNTER — Encounter (HOSPITAL_BASED_OUTPATIENT_CLINIC_OR_DEPARTMENT_OTHER): Payer: BC Managed Care – PPO | Admitting: Anesthesiology

## 2013-08-20 ENCOUNTER — Ambulatory Visit (HOSPITAL_BASED_OUTPATIENT_CLINIC_OR_DEPARTMENT_OTHER)
Admission: RE | Admit: 2013-08-20 | Discharge: 2013-08-20 | Disposition: A | Discharge status: Home / Self Care | Payer: BC Managed Care – PPO | Source: Ambulatory Visit | Attending: Urology | Admitting: Urology

## 2013-08-20 ENCOUNTER — Encounter (HOSPITAL_BASED_OUTPATIENT_CLINIC_OR_DEPARTMENT_OTHER)
Admission: RE | Disposition: A | Discharge status: Home / Self Care | Payer: Self-pay | Source: Ambulatory Visit | Attending: Urology

## 2013-08-20 ENCOUNTER — Encounter (HOSPITAL_BASED_OUTPATIENT_CLINIC_OR_DEPARTMENT_OTHER): Payer: Self-pay | Admitting: *Deleted

## 2013-08-20 DIAGNOSIS — N200 Calculus of kidney: Secondary | ICD-10-CM | POA: Insufficient documentation

## 2013-08-20 DIAGNOSIS — F411 Generalized anxiety disorder: Secondary | ICD-10-CM | POA: Insufficient documentation

## 2013-08-20 DIAGNOSIS — E039 Hypothyroidism, unspecified: Secondary | ICD-10-CM | POA: Insufficient documentation

## 2013-08-20 DIAGNOSIS — I10 Essential (primary) hypertension: Secondary | ICD-10-CM | POA: Insufficient documentation

## 2013-08-20 DIAGNOSIS — Z79899 Other long term (current) drug therapy: Secondary | ICD-10-CM | POA: Insufficient documentation

## 2013-08-20 DIAGNOSIS — N201 Calculus of ureter: Secondary | ICD-10-CM | POA: Insufficient documentation

## 2013-08-20 DIAGNOSIS — Z888 Allergy status to other drugs, medicaments and biological substances status: Secondary | ICD-10-CM | POA: Insufficient documentation

## 2013-08-20 HISTORY — DX: Prediabetes: R73.03

## 2013-08-20 HISTORY — DX: Personal history of urinary calculi: Z87.442

## 2013-08-20 HISTORY — DX: Other specified personal risk factors, not elsewhere classified: Z91.89

## 2013-08-20 HISTORY — PX: CYSTOSCOPY WITH RETROGRADE PYELOGRAM, URETEROSCOPY AND STENT PLACEMENT: SHX5789

## 2013-08-20 HISTORY — DX: Urgency of urination: R39.15

## 2013-08-20 HISTORY — DX: Migraine, unspecified, not intractable, without status migrainosus: G43.909

## 2013-08-20 HISTORY — DX: Hyperlipidemia, unspecified: E78.5

## 2013-08-20 HISTORY — DX: Dysuria: R30.0

## 2013-08-20 HISTORY — DX: Calculus of kidney: N20.0

## 2013-08-20 LAB — BASIC METABOLIC PANEL
Anion gap: 13 (ref 5–15)
BUN: 6 mg/dL (ref 6–23)
CHLORIDE: 91 meq/L — AB (ref 96–112)
CO2: 33 meq/L — AB (ref 19–32)
Calcium: 9.7 mg/dL (ref 8.4–10.5)
Creatinine, Ser: 0.82 mg/dL (ref 0.50–1.10)
GFR calc non Af Amer: 76 mL/min — ABNORMAL LOW (ref >90)
GFR, EST AFRICAN AMERICAN: 88 mL/min — AB (ref >90)
Glucose, Bld: 142 mg/dL — ABNORMAL HIGH (ref 70–99)
Potassium: 3.3 mEq/L — ABNORMAL LOW (ref 3.7–5.3)
Sodium: 137 mEq/L (ref 137–147)

## 2013-08-20 LAB — POCT I-STAT, CHEM 8
BUN: 4 mg/dL — ABNORMAL LOW (ref 6–23)
CHLORIDE: 90 meq/L — AB (ref 96–112)
Calcium, Ion: 1.13 mmol/L (ref 1.13–1.30)
Creatinine, Ser: 0.8 mg/dL (ref 0.50–1.10)
Glucose, Bld: 139 mg/dL — ABNORMAL HIGH (ref 70–99)
HEMATOCRIT: 46 % (ref 36.0–46.0)
Hemoglobin: 15.6 g/dL — ABNORMAL HIGH (ref 12.0–15.0)
Potassium: 2.8 mEq/L — CL (ref 3.7–5.3)
Sodium: 136 mEq/L — ABNORMAL LOW (ref 137–147)
TCO2: 29 mmol/L (ref 0–100)

## 2013-08-20 LAB — GLUCOSE, CAPILLARY: GLUCOSE-CAPILLARY: 131 mg/dL — AB (ref 70–99)

## 2013-08-20 SURGERY — CYSTOURETEROSCOPY, WITH RETROGRADE PYELOGRAM AND STENT INSERTION
Anesthesia: General | Site: Ureter | Laterality: Left

## 2013-08-20 MED ORDER — GENTAMICIN IN SALINE 1.6-0.9 MG/ML-% IV SOLN
80.0000 mg | INTRAVENOUS | Status: DC
  Filled 2013-08-20: qty 50

## 2013-08-20 MED ORDER — OXYBUTYNIN CHLORIDE 5 MG PO TABS
ORAL_TABLET | ORAL | Status: AC
  Filled 2013-08-20: qty 1

## 2013-08-20 MED ORDER — MIDAZOLAM HCL 5 MG/5ML IJ SOLN
INTRAMUSCULAR | Status: DC | PRN
  Administered 2013-08-20: 2 mg via INTRAVENOUS

## 2013-08-20 MED ORDER — KETOROLAC TROMETHAMINE 30 MG/ML IJ SOLN
INTRAMUSCULAR | Status: DC | PRN
  Administered 2013-08-20: 30 mg via INTRAVENOUS

## 2013-08-20 MED ORDER — FENTANYL CITRATE 0.05 MG/ML IJ SOLN
INTRAMUSCULAR | Status: DC | PRN
  Administered 2013-08-20: 50 ug via INTRAVENOUS
  Administered 2013-08-20 (×2): 25 ug via INTRAVENOUS

## 2013-08-20 MED ORDER — GENTAMICIN SULFATE 40 MG/ML IJ SOLN
5.0000 mg/kg | Freq: Once | INTRAVENOUS | Status: AC
  Administered 2013-08-20: 560 mg via INTRAVENOUS
  Filled 2013-08-20 (×2): qty 14

## 2013-08-20 MED ORDER — LIDOCAINE HCL (CARDIAC) 20 MG/ML IV SOLN
INTRAVENOUS | Status: DC | PRN
  Administered 2013-08-20: 60 mg via INTRAVENOUS

## 2013-08-20 MED ORDER — SODIUM CHLORIDE 0.9 % IR SOLN
Status: DC | PRN
  Administered 2013-08-20: 4000 mL

## 2013-08-20 MED ORDER — LACTATED RINGERS IV SOLN
INTRAVENOUS | Status: DC
  Administered 2013-08-20: 10:00:00 via INTRAVENOUS
  Filled 2013-08-20: qty 1000

## 2013-08-20 MED ORDER — OXYCODONE-ACETAMINOPHEN 5-325 MG PO TABS
ORAL_TABLET | ORAL | Status: AC
  Filled 2013-08-20: qty 1

## 2013-08-20 MED ORDER — SULFAMETHOXAZOLE-TMP DS 800-160 MG PO TABS: 1.0000 | ORAL_TABLET | Freq: Two times a day (BID) | ORAL | Status: DC

## 2013-08-20 MED ORDER — DEXAMETHASONE SODIUM PHOSPHATE 4 MG/ML IJ SOLN
INTRAMUSCULAR | Status: DC | PRN
  Administered 2013-08-20: 10 mg via INTRAVENOUS

## 2013-08-20 MED ORDER — OXYCODONE-ACETAMINOPHEN 5-325 MG PO TABS
1.0000 | ORAL_TABLET | Freq: Four times a day (QID) | ORAL | Status: DC | PRN
  Administered 2013-08-20: 1 via ORAL
  Filled 2013-08-20: qty 2

## 2013-08-20 MED ORDER — OXYCODONE-ACETAMINOPHEN 5-325 MG PO TABS: 1.0000 | ORAL_TABLET | Freq: Four times a day (QID) | ORAL | Status: DC | PRN

## 2013-08-20 MED ORDER — SENNOSIDES-DOCUSATE SODIUM 8.6-50 MG PO TABS: 1.0000 | ORAL_TABLET | Freq: Two times a day (BID) | ORAL | Status: DC

## 2013-08-20 MED ORDER — MIDAZOLAM HCL 2 MG/2ML IJ SOLN
INTRAMUSCULAR | Status: AC
  Filled 2013-08-20: qty 2

## 2013-08-20 MED ORDER — FENTANYL CITRATE 0.05 MG/ML IJ SOLN
25.0000 ug | INTRAMUSCULAR | Status: DC | PRN
  Filled 2013-08-20: qty 1

## 2013-08-20 MED ORDER — IOHEXOL 350 MG/ML SOLN
INTRAVENOUS | Status: DC | PRN
  Administered 2013-08-20: 12 mL

## 2013-08-20 MED ORDER — FENTANYL CITRATE 0.05 MG/ML IJ SOLN
INTRAMUSCULAR | Status: AC
  Filled 2013-08-20: qty 6

## 2013-08-20 MED ORDER — PROMETHAZINE HCL 25 MG/ML IJ SOLN
6.2500 mg | INTRAMUSCULAR | Status: DC | PRN
  Filled 2013-08-20: qty 1

## 2013-08-20 MED ORDER — ONDANSETRON HCL 4 MG/2ML IJ SOLN
INTRAMUSCULAR | Status: DC | PRN
  Administered 2013-08-20: 4 mg via INTRAVENOUS

## 2013-08-20 MED ORDER — OXYBUTYNIN CHLORIDE 5 MG PO TABS
5.0000 mg | ORAL_TABLET | Freq: Three times a day (TID) | ORAL | Status: DC
  Administered 2013-08-20: 5 mg via ORAL
  Filled 2013-08-20: qty 1

## 2013-08-20 MED ORDER — LACTATED RINGERS IV SOLN
INTRAVENOUS | Status: DC
  Administered 2013-08-20: 07:00:00 via INTRAVENOUS
  Filled 2013-08-20: qty 1000

## 2013-08-20 MED ORDER — PROPOFOL 10 MG/ML IV BOLUS
INTRAVENOUS | Status: DC | PRN
  Administered 2013-08-20: 200 mg via INTRAVENOUS

## 2013-08-20 MED ORDER — BELLADONNA ALKALOIDS-OPIUM 16.2-60 MG RE SUPP
RECTAL | Status: AC
  Filled 2013-08-20: qty 1

## 2013-08-20 SURGICAL SUPPLY — 42 items
BAG DRAIN URO-CYSTO SKYTR STRL (DRAIN) ×4 IMPLANT
BASKET LASER NITINOL 1.9FR (BASKET) ×4 IMPLANT
BASKET STNLS GEMINI 4WIRE 3FR (BASKET) IMPLANT
BASKET STONE NCOMPASS (UROLOGICAL SUPPLIES) ×4 IMPLANT
BASKET ZERO TIP NITINOL 2.4FR (BASKET) IMPLANT
CANISTER SUCT LVC 12 LTR MEDI- (MISCELLANEOUS) ×4 IMPLANT
CATH INTERMIT  6FR 70CM (CATHETERS) ×4 IMPLANT
CATH URET 5FR 28IN CONE TIP (BALLOONS)
CATH URET 5FR 28IN OPEN ENDED (CATHETERS) IMPLANT
CATH URET 5FR 70CM CONE TIP (BALLOONS) IMPLANT
CLOTH BEACON ORANGE TIMEOUT ST (SAFETY) ×4 IMPLANT
DRAPE CAMERA CLOSED 9X96 (DRAPES) ×4 IMPLANT
ELECT REM PT RETURN 9FT ADLT (ELECTROSURGICAL)
ELECTRODE REM PT RTRN 9FT ADLT (ELECTROSURGICAL) IMPLANT
FIBER LASER FLEXIVA 200 (UROLOGICAL SUPPLIES) IMPLANT
FIBER LASER FLEXIVA 365 (UROLOGICAL SUPPLIES) IMPLANT
GLOVE BIO SURGEON STRL SZ7.5 (GLOVE) ×4 IMPLANT
GLOVE BIOGEL M 6.5 STRL (GLOVE) ×4 IMPLANT
GLOVE BIOGEL PI IND STRL 6.5 (GLOVE) ×4 IMPLANT
GLOVE BIOGEL PI INDICATOR 6.5 (GLOVE) ×4
GOWN PREVENTION PLUS LG XLONG (DISPOSABLE) IMPLANT
GOWN STRL REIN XL XLG (GOWN DISPOSABLE) IMPLANT
GOWN STRL REUS W/TWL LRG LVL3 (GOWN DISPOSABLE) ×4 IMPLANT
GOWN STRL REUS W/TWL XL LVL3 (GOWN DISPOSABLE) ×4 IMPLANT
GUIDEWIRE 0.038 PTFE COATED (WIRE) IMPLANT
GUIDEWIRE ANG ZIPWIRE 038X150 (WIRE) ×4 IMPLANT
GUIDEWIRE STR DUAL SENSOR (WIRE) ×4 IMPLANT
IV NS 1000ML (IV SOLUTION) ×2
IV NS 1000ML BAXH (IV SOLUTION) ×2 IMPLANT
IV NS IRRIG 3000ML ARTHROMATIC (IV SOLUTION) ×8 IMPLANT
KIT BALLIN UROMAX 15FX10 (LABEL) IMPLANT
KIT BALLN UROMAX 15FX4 (MISCELLANEOUS) IMPLANT
KIT BALLN UROMAX 26 75X4 (MISCELLANEOUS)
PACK CYSTOSCOPY (CUSTOM PROCEDURE TRAY) ×4 IMPLANT
SET HIGH PRES BAL DIL (LABEL)
SHEATH ACCESS URETERAL 24CM (SHEATH) ×4 IMPLANT
SHEATH URET ACCESS 12FR/35CM (UROLOGICAL SUPPLIES) IMPLANT
SHEATH URET ACCESS 12FR/55CM (UROLOGICAL SUPPLIES) IMPLANT
STENT POLARIS 5FRX22 (STENTS) ×4 IMPLANT
SYRINGE 10CC LL (SYRINGE) ×4 IMPLANT
SYRINGE IRR TOOMEY STRL 70CC (SYRINGE) IMPLANT
TUBE FEEDING 8FR 16IN STR KANG (MISCELLANEOUS) IMPLANT

## 2013-08-20 NOTE — Anesthesia Procedure Notes (Signed)
Procedure Name: LMA Insertion Date/Time: 08/20/2013 8:26 AM Performed by: Denna Haggard D Pre-anesthesia Checklist: Patient identified, Emergency Drugs available, Suction available and Patient being monitored Patient Re-evaluated:Patient Re-evaluated prior to inductionOxygen Delivery Method: Circle System Utilized Preoxygenation: Pre-oxygenation with 100% oxygen Intubation Type: IV induction Ventilation: Mask ventilation without difficulty LMA: LMA inserted LMA Size: 4.0 Number of attempts: 1 Airway Equipment and Method: bite block Placement Confirmation: positive ETCO2 Tube secured with: Tape Dental Injury: Teeth and Oropharynx as per pre-operative assessment

## 2013-08-20 NOTE — H&P (Signed)
Christie Fox is an 58 y.o. female.    Chief Complaint: Pre-OP Second Stage Left Ureteroscopic Stone Manipulation  HPI:   1 - Recurrent Nephrolithiasis -  Pre 2015 - MET x several, URS x 1 on left side 06/2013 - 2cm left intrarenal stone w/o hydro on CT for eval colickly flank pain. Stones 500 HU, SSD 13 cm. Underwent 2st stage left uretteroscopic laser lithotripsy and stent placement 07/29/13.  2 - Medical Stone Disease -  Eval 2015: BMP,PTH,Urate -normal; Composition - pending; 24 Hr Urines - pending  3 - Chronic Bacteruria - recent UCX 6/ 2015 pan-sensitive e. coli, low growth, no fevers, treated with Cipro. F/U CX's still with non-specific non-clonal growth. No fevers.  NO hydro on recent imaging. Has been on Bactrim peri-op to help reduce likely chronic stone colonization.   PMH sig for back surgery x several, benign hyst, obesity, HTN. No CV disease, no strong blood thinners.  Today Christie Fox is seen to proceed with left second stage ureteroscopic stone manipulation.  No interval fevers.   Past Medical History  Diagnosis Date  . PONV (postoperative nausea and vomiting)   . Hypertension   . Hypothyroidism   . Anxiety   . Arthritis     knees  . Renal calculus, left   . History of kidney stones   . Hyperlipidemia   . Migraines   . Borderline diabetes     diet controlled  . Dysuria   . Urgency of urination   . At risk for sleep apnea     STOP-BANG= 4    SENT TO PCP 07-31-2013    Past Surgical History  Procedure Laterality Date  . Cesarean section  x2  . Wisdom tooth extraction    . Cystoscopy with retrograde pyelogram, ureteroscopy and stent placement Left 08/04/2013    Procedure: 1ST STAGE CYSTOSCOPY WITH RETROGRADE PYELOGRAM, URETEROSCOPY AND STENT PLACEMENT;  Surgeon: Alexis Frock, MD;  Location: [facility];  Service: Urology;  Laterality: Left;  . Holmium laser application Left 0/03/7046    Procedure: HOLMIUM LASER APPLICATION;  Surgeon: Alexis Frock, MD;  Location: [facility]  [facility];  Service: Urology;  Laterality: Left;  . Orif left ankle fx  03-20-2000    RETAINED HARDWARE  . Exploratory laparotomy w/ bilateral salpinoophorectomy  11-27-2001  . Left ureteroscopic laser litho stone extraction and stent placement  03-25-2005  . Lumbar fusion  X2   LAST ONE 2000  . Vaginal hysterectomy  1999    History reviewed. No pertinent family history. Social History:  reports that she has never smoked. She has never used smokeless tobacco. She reports that she drinks alcohol. She reports that she does not use illicit drugs.  Allergies:  Allergies  Allergen Reactions  . Imitrex [Sumatriptan] Shortness Of Breath and Swelling    No prescriptions prior to admission    No results found for this or any previous visit (from the past 48 hour(s)). No results found.  Review of Systems  Constitutional: Negative.  Negative for fever, chills and malaise/fatigue.  HENT: Negative.   Eyes: Negative.   Respiratory: Negative.   Cardiovascular: Negative.   Gastrointestinal: Negative.  Negative for nausea and vomiting.  Genitourinary: Positive for urgency and frequency.  Musculoskeletal: Negative.   Skin: Negative.   Neurological: Negative.   Endo/Heme/Allergies: Negative.   Psychiatric/Behavioral: Negative.     There were no vitals taken for this visit. Physical Exam  Constitutional: She is oriented to person, place, and time. She appears well-developed and well-nourished.  HENT:  Head: Normocephalic and atraumatic.  Eyes: EOM are normal. Pupils are equal, round, and reactive to light.  Neck: Normal range of motion. Neck supple.  Cardiovascular: Normal rate and regular rhythm.   Respiratory: Effort normal and breath sounds normal.  GI: Soft. Bowel sounds are normal.  Genitourinary:  No CVAT  Musculoskeletal: Normal range of motion.  Neurological: She is alert and oriented to person, place, and time.  Skin: Skin is warm and dry.  Psychiatric: She has a normal mood and  affect. Her behavior is normal. Judgment and thought content normal.     Assessment/Plan   1 - Recurrent Nephrolithiasis -  We rediscussed ureteroscopic stone manipulation with basketing and laser-lithotripsy in detail.  We rediscussed risks including bleeding, infection, damage to kidney / ureter  bladder, rarely loss of kidney. We rediscussed anesthetic risks and rare but serious surgical complications including DVT, PE, MI, and mortality. We specifically readdressed that in 5-10% of cases a staged approach is required with stenting followed by re-attempt ureteroscopy if anatomy unfavorable. The patient voiced understanding and wises to proceed today as planned.      2 - Medical Stone Disease - blood eval complete, composition and  24 hr urines pneidng.   3 - Chronic Bacteruria - Afebrile, no hydro. On peri-op ABX to reduce colonization.   Christie Fox 08/20/2013, 6:38 AM

## 2013-08-20 NOTE — Anesthesia Postprocedure Evaluation (Signed)
Anesthesia Post Note  Patient: Christie Fox  Procedure(s) Performed: Procedure(s) (LRB): 2ND STAGE CYSTOSCOPY WITH RETROGRADE PYELOGRAM, URETEROSCOPY BASKET STONES AND STENT EXCHANGE, BLOOD MOP (Left)  Anesthesia type: General  Patient location: PACU  Post pain: Pain level controlled  Post assessment: Post-op Vital signs reviewed  Last Vitals:  Filed Vitals:   08/20/13 0945  BP: 142/91  Pulse: 115  Temp:   Resp: 18    Post vital signs: Reviewed  Level of consciousness: sedated  Complications: No apparent anesthesia complications

## 2013-08-20 NOTE — Discharge Instructions (Signed)
1 - You may have urinary urgency (bladder spasms) and bloody urine on / off with stent in place. This is normal.  2 - Call MD or go to ER for fever >102, severe pain / nausea / vomiting not relieved by medications, or acute change in medical status  3 - Remove tethered stent at home on Monday morning by pulling on string, then blue/white plastic tubing, and discarding. Dr. Tresa Moore is in the office Monday if any issues arise.   [facility] [phone] Post Ureteroscopy With or Without Stent Instructions  Definitions:  Ureter: The duct that transports urine from the kidney to the bladder. Stent:   A plastic hollow tube that is placed into the ureter, from the kidney to the                 bladder to prevent the ureter from swelling shut.  GENERAL INSTRUCTIONS:  Despite the fact that no skin incisions were used, the area around the ureter and bladder is raw and irritated. The stent is a foreign body which will further irritate the bladder wall. This irritation is manifested by increased frequency of urination, both day and night, and by an increase in the urge to urinate. In some, the urge to urinate is present almost always. Sometimes the urge is strong enough that you may not be able to stop yourself from urinating. The only real cure is to remove the stent and then give time for the bladder wall to heal which can't be done until the danger of the ureter swelling shut has passed, which varies.  You may see some blood in your urine while the stent is in place and a few days afterwards. Do not be alarmed, even if the urine was clear for a while. Get off your feet and drink lots of fluids until clearing occurs. If you start to pass clots or don't improve, call us.  DIET: You may return to your normal diet immediately. Because of the raw surface of your bladder, alcohol, spicy foods, acid type foods and drinks with caffeine may cause irritation or frequency and should be used in  moderation. To keep your urine flowing freely and to avoid constipation, drink plenty of fluids during the day ( 8-10 glasses ). Tip: Avoid cranberry juice because it is very acidic.  ACTIVITY: Your physical activity doesn't need to be restricted. However, if you are very active, you may see some blood in your urine. We suggest that you reduce your activity under these circumstances until the bleeding has stopped.  BOWELS: It is important to keep your bowels regular during the postoperative period. Straining with bowel movements can cause bleeding. A bowel movement every other day is reasonable. Use a mild laxative if needed, such as Milk of Magnesia 2-3 tablespoons, or 2 Dulcolax tablets. Call if you continue to have problems. If you have been taking narcotics for pain, before, during or after your surgery, you may be constipated. Take a laxative if necessary.   MEDICATION: You should resume your pre-surgery medications unless told not to. In addition you will often be given an antibiotic to prevent infection. These should be taken as prescribed until the bottles are finished unless you are having an unusual reaction to one of the drugs.  PROBLEMS YOU SHOULD REPORT TO [place]:  Fevers over 100.5 Fahrenheit.  Heavy bleeding, or clots ( See above notes about blood in urine ).  Inability to urinate.  Drug reactions ( hives, rash, nausea, vomiting,  diarrhea ).  Severe burning or pain with urination that is not improving.  FOLLOW-UP: You will need a follow-up appointment to monitor your progress. Call for this appointment at the number listed above. Usually the first appointment will be about three to fourteen days after your surgery.      Post Anesthesia Home Care Instructions  Activity: Get plenty of rest for the remainder of the day. A responsible adult should stay with you for 24 hours following the procedure.  For the next 24 hours, DO NOT: -Drive a car -Paediatric nurse -Drink  alcoholic beverages -Take any medication unless instructed by your physician -Make any legal decisions or sign important papers.  Meals: Start with liquid foods such as gelatin or soup. Progress to regular foods as tolerated. Avoid greasy, spicy, heavy foods. If nausea and/or vomiting occur, drink only clear liquids until the nausea and/or vomiting subsides. Call your physician if vomiting continues.  Special Instructions/Symptoms: Your throat may feel dry or sore from the anesthesia or the breathing tube placed in your throat during surgery. If this causes discomfort, gargle with warm salt water. The discomfort should disappear within 24 hours.

## 2013-08-20 NOTE — Transfer of Care (Signed)
Immediate Anesthesia Transfer of Care Note  Patient: Christie Fox  Procedure(s) Performed: Procedure(s) (LRB): 2ND STAGE CYSTOSCOPY WITH RETROGRADE PYELOGRAM, URETEROSCOPY BASKET STONES AND STENT EXCHANGE, BLOOD MOP (Left)  Patient Location: PACU  Anesthesia Type: General  Level of Consciousness: awake, oriented, sedated and patient cooperative  Airway & Oxygen Therapy: Patient Spontanous Breathing and Patient connected to face mask oxygen  Post-op Assessment: Report given to PACU RN and Post -op Vital signs reviewed and stable  Post vital signs: Reviewed and stable  Complications: No apparent anesthesia complications

## 2013-08-20 NOTE — Brief Op Note (Signed)
08/20/2013  9:32 AM  PATIENT:  Christie Fox  58 y.o. female  PRE-OPERATIVE DIAGNOSIS:  LEFT RENAL STONES, FLANK PAIN  POST-OPERATIVE DIAGNOSIS:  LEFT RENAL STONES, FLANK PAIN  PROCEDURE:  Procedure(s): 2ND STAGE CYSTOSCOPY WITH RETROGRADE PYELOGRAM, URETEROSCOPY BASKET STONES AND STENT EXCHANGE, BLOOD MOP (Left)  SURGEON:  Surgeon(s) and Role:    * Alexis Frock, MD - Primary  PHYSICIAN ASSISTANT:   ASSISTANTS: none   ANESTHESIA:   general  EBL:  Total I/O In: 200 [I.V.:200] Out: -   BLOOD ADMINISTERED:none  DRAINS: none   LOCAL MEDICATIONS USED:  NONE  SPECIMEN:  Source of Specimen:  Left renal and ureteral stone fragments  DISPOSITION OF SPECIMEN:  given to patient  COUNTS:  YES  TOURNIQUET:  * No tourniquets in log *  DICTATION: .Other Dictation: Dictation Number [id number]  PLAN OF CARE: Discharge to home after PACU  PATIENT DISPOSITION:  PACU - hemodynamically stable.   Delay start of Pharmacological VTE agent (>24hrs) due to surgical blood loss or risk of bleeding: not applicable

## 2013-08-20 NOTE — Anesthesia Preprocedure Evaluation (Signed)
Anesthesia Evaluation  Patient identified by MRN, date of birth, ID band Patient awake    Reviewed: Allergy & Precautions, H&P , NPO status , Patient's Chart, lab work & pertinent test results  History of Anesthesia Complications (+) PONV  Airway Mallampati: III TM Distance: <3 FB Neck ROM: Full    Dental no notable dental hx. (+) Teeth Intact, Dental Advisory Given   Pulmonary neg pulmonary ROS,  breath sounds clear to auscultation  + decreased breath sounds      Cardiovascular hypertension, Pt. on medications Rhythm:Regular Rate:Normal     Neuro/Psych Anxiety negative neurological ROS     GI/Hepatic negative GI ROS, Neg liver ROS,   Endo/Other  diabetes (Borderline, diet control.)Hypothyroidism Morbid obesity  Renal/GU Renal disease  negative genitourinary   Musculoskeletal negative musculoskeletal ROS (+)   Abdominal   Peds negative pediatric ROS (+)  Hematology negative hematology ROS (+)   Anesthesia Other Findings   Reproductive/Obstetrics negative OB ROS                           Anesthesia Physical Anesthesia Plan  ASA: III  Anesthesia Plan: General   Post-op Pain Management:    Induction: Intravenous  Airway Management Planned: LMA  Additional Equipment:   Intra-op Plan:   Post-operative Plan: Extubation in OR  Informed Consent: I have reviewed the patients History and Physical, chart, labs and discussed the procedure including the risks, benefits and alternatives for the proposed anesthesia with the patient or authorized representative who has indicated his/her understanding and acceptance.   Dental advisory given  Plan Discussed with: CRNA  Anesthesia Plan Comments:         Anesthesia Quick Evaluation

## 2013-08-21 ENCOUNTER — Encounter (HOSPITAL_BASED_OUTPATIENT_CLINIC_OR_DEPARTMENT_OTHER): Payer: Self-pay | Admitting: Urology

## 2013-08-21 LAB — POCT I-STAT 4, (NA,K, GLUC, HGB,HCT)
GLUCOSE: 137 mg/dL — AB (ref 70–99)
HCT: 46 % (ref 36.0–46.0)
Hemoglobin: 15.6 g/dL — ABNORMAL HIGH (ref 12.0–15.0)
POTASSIUM: 2.7 meq/L — AB (ref 3.7–5.3)
Sodium: 136 mEq/L — ABNORMAL LOW (ref 137–147)

## 2013-08-22 NOTE — Op Note (Signed)
NAMERESHA, FILIPPONE               ACCOUNT NO.:  [id number]  MEDICAL RECORD NO.:  [id number]  LOCATION:                                 FACILITY:  PHYSICIAN:  Alexis Frock, MD     DATE OF BIRTH:  [birth year]  DATE OF PROCEDURE:  08/20/2013 DATE OF DISCHARGE:  08/20/2013                              OPERATIVE REPORT   DIAGNOSIS:  Large volume left renal stone residual fragment, status post first stage procedure.  PROCEDURES: 1. Cystoscopy with left retrograde pyelogram interpretation. 2. Exchange of left ureteral stent with tether to the thigh. 3. Left ureteroscopy with basketing of stones.  ESTIMATED BLOOD LOSS:  Nil.  COMPLICATIONS:  None.  SPECIMEN:  Residual left renal stone and ureteral stone fragments for compositional analysis.  FINDINGS: 1. Unremarkable urinary bladder. 2. Small ureteral and renal stone fragments, total stone volume     approximately 8 mm, scattered throughout the kidney. 3. Otherwise, unremarkable left retrograde pyelogram, somewhat bifid     kidney.  INDICATIONS:  The patient is a 58 year old lady with history of nephrolithiasis, who was found on workup of hematuria and flank pain to have a large left renal stone approximately 2 cm in size.  Options were discussed including percutaneous approach versus staged ureteroscopy and she wished to proceed with the latter.  Informed consent was obtained and placed in medical record.  Notably, she had a 1st stage procedure on July 29, 2013 at which time, the vast majority of her stone was removed. Now she presents for second-stage procedure today for completion.  PROCEDURE IN DETAIL:  The patient being Christie Fox, procedure being left second stage ureteroscopic stone manipulation was confirmed. Procedure was carried out.  Time-out was performed.  Intravenous antibiotics administered.  General LMA anesthesia was introduced.  The patient was placed into a low lithotomy position.  Sterile field  was created by prepping the patient's vagina, introitus, and proximal thighs using iodine x3.  Next, cystourethroscopy was performed using a 22- [place] rigid scope with 12-degree offset lens, inspection of bladder revealed no diverticula, calcifications, papular lesions.  Distal end of the left ureteral stent was seen in situ.  This was grasped proximal of the urethral meatus through which a 0.03 Glidewire was advanced at the level of the upper pole.  The stent was exchanged for a 6-French end- hole catheter and left retrograde pyelogram was obtained.  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  There were no filling defects or extravasation.  The Glidewire was once again advanced at the level of the upper pole and set aside as a safety wire.  An 8-French feeding tube was placed in urinary bladder for pressure release.  Next, semi-rigid ureteroscopy was performed in the entire length of left ureter alongside a separate Sensor working wire.  There was several small ureteral stone fragments that were was purposely pushed and flushed retrograde back towards the kidney and the semi-rigid ureteroscope was exchanged for 24 cm, 12/14 ureteral access sheath, which was placed at the level of proximal ureter using continuous fluoroscopic guidance.  Next, flexible digital ureteroscopy was performed.  Full inspection of the proximal ureter and pan endoscopic examination of the kidney x2.  There were multiple small fragments and various calices, total stone volume approximately 8 mm. Most of these appeared to be amenable to simple basketing.  As such, an escape basket was used to grasp dominant stone fragments and these were removed in their entirety, set aside for compositional analysis.  There was a conglomerate of small stones in the mid pole calyx that was too small for basketing, but  in a conglomerate, it was felt to be somewhat large, as such a blood mop technique was used in which 5 mL of autologous blood was instilled via ureteroscope under direct ureteroscopic vision into this calyx, allowed to clot for 5 minutes and then removed, which then allowed for approximately 80-90% removal of this conglomerate of small fragments.  Following these maneuvers, panendoscopic examination of the kidney revealed complete resolution of all stone fragments larger than 1/3 mm.  There was no evidence of perforation.  Hemostasis appeared excellent.  The sheath was removed under continuous ureteroscopic vision.  No mucosal abnormalities were found.  Finally, a new, papillary-type stent was placed with remaining safety wire.  Good proximal and distal deployment were noted.  A tether was left in place and fashioned to the thigh.  Bladder was emptied per cystoscope.  Procedure was then terminated.  The patient tolerated the procedure well with no immediate periprocedural complications.  The patient was taken to postanesthesia care unit in stable condition.          ______________________________ Alexis Frock, MD     TM/MEDQ  D:  08/20/2013  T:  08/20/2013  Job:  [id number]

## 2014-08-19 DIAGNOSIS — E039 Hypothyroidism, unspecified: Secondary | ICD-10-CM | POA: Insufficient documentation

## 2014-08-19 DIAGNOSIS — I10 Essential (primary) hypertension: Secondary | ICD-10-CM | POA: Insufficient documentation

## 2014-08-19 DIAGNOSIS — E785 Hyperlipidemia, unspecified: Secondary | ICD-10-CM | POA: Insufficient documentation

## 2014-08-19 DIAGNOSIS — M170 Bilateral primary osteoarthritis of knee: Secondary | ICD-10-CM | POA: Insufficient documentation

## 2016-11-22 ENCOUNTER — Other Ambulatory Visit (HOSPITAL_COMMUNITY): Payer: Self-pay | Admitting: Surgery

## 2016-11-27 ENCOUNTER — Encounter: Payer: BLUE CROSS/BLUE SHIELD | Attending: Surgery | Admitting: Skilled Nursing Facility1

## 2016-11-27 ENCOUNTER — Encounter: Payer: Self-pay | Admitting: Skilled Nursing Facility1

## 2016-11-27 DIAGNOSIS — Z713 Dietary counseling and surveillance: Secondary | ICD-10-CM | POA: Insufficient documentation

## 2016-11-27 DIAGNOSIS — E669 Obesity, unspecified: Secondary | ICD-10-CM

## 2016-11-27 NOTE — Progress Notes (Signed)
Pre-Op Assessment Visit:  Pre-Operative Sleeve Gastrectomy Surgery  Medical Nutrition Therapy:  Appt start time: 10:35  End time:  11:40  Patient was seen on 11/27/2016 for Pre-Operative Nutrition Assessment. Assessment and letter of approval faxed to [facility] Surgery Bariatric Surgery Program coordinator on 11/27/2016.   Pt states he is currently doing atkins. Pt states she does not eat red meat.  Pt states she needs 4 SWL.   Start weight at [facility]: 249.3 BMI: 45.44  24 hr Dietary Recall: First Meal: premier protein or egg Snack: apple Second Meal:  Salad with vinaigrette with [place] patty or chicken or fish  Snack:  Third Meal: green beans or broccoli and salad with meat Snack:  Beverages: water  Encouraged to engage in 150 minutes of moderate physical activity including cardiovascular and weight baring weekly  Handouts given during visit include:  . Pre-Op Goals . Bariatric Surgery Protein Shakes During the appointment today the following Pre-Op Goals were reviewed with the patient: . Maintain or lose weight as instructed by your surgeon . Make healthy food choices . Begin to limit portion sizes . Limited concentrated sugars and fried foods . Keep fat/sugar in the single digits per serving on             food labels . Practice CHEWING your food  (aim for 30 chews per bite or until applesauce consistency) . Practice not drinking 15 minutes before, during, and 30 minutes after each meal/snack . Avoid all carbonated beverages  . Avoid/limit caffeinated beverages  . Avoid all sugar-sweetened beverages . Consume 3 meals per day; eat every 3-5 hours . Make a list of non-food related activities . Aim for 64-100 ounces of FLUID daily  . Aim for at least 60-80 grams of PROTEIN daily . Look for a liquid protein source that contain ?15 g protein and ?5 g carbohydrate  (ex: shakes, drinks, shots)  -Follow diet recommendations listed below   Energy and Macronutrient  Recomendations: Calories: 1500 Carbohydrate: 170 Protein: 112 Fat: 42  Demonstrated degree of understanding via:  Teach Back  Teaching Method Utilized:  Visual Auditory Hands on  Barriers to learning/adherence to lifestyle change: none identified   Patient to call the [facility] to enroll in Pre-Op and Post-Op Nutrition Education when surgery date is scheduled.

## 2016-12-01 ENCOUNTER — Ambulatory Visit (HOSPITAL_COMMUNITY)
Admission: RE | Admit: 2016-12-01 | Discharge: 2016-12-01 | Disposition: A | Discharge status: Home / Self Care | Payer: BLUE CROSS/BLUE SHIELD | Source: Ambulatory Visit | Attending: Surgery | Admitting: Surgery

## 2016-12-01 ENCOUNTER — Other Ambulatory Visit: Payer: Self-pay

## 2016-12-01 DIAGNOSIS — Z01818 Encounter for other preprocedural examination: Secondary | ICD-10-CM | POA: Insufficient documentation

## 2016-12-01 DIAGNOSIS — K8020 Calculus of gallbladder without cholecystitis without obstruction: Secondary | ICD-10-CM | POA: Insufficient documentation

## 2016-12-01 DIAGNOSIS — Z6841 Body Mass Index (BMI) 40.0 and over, adult: Secondary | ICD-10-CM | POA: Insufficient documentation

## 2016-12-01 DIAGNOSIS — R000 Tachycardia, unspecified: Secondary | ICD-10-CM | POA: Diagnosis not present

## 2016-12-19 ENCOUNTER — Encounter: Payer: BLUE CROSS/BLUE SHIELD | Attending: Surgery | Admitting: Skilled Nursing Facility1

## 2016-12-19 ENCOUNTER — Encounter: Payer: Self-pay | Admitting: Skilled Nursing Facility1

## 2016-12-19 DIAGNOSIS — Z713 Dietary counseling and surveillance: Secondary | ICD-10-CM | POA: Insufficient documentation

## 2016-12-19 DIAGNOSIS — E669 Obesity, unspecified: Secondary | ICD-10-CM

## 2016-12-19 NOTE — Progress Notes (Signed)
Sleeve  Assessment:   1st  SWL Appointment.   Pt states he is currently doing atkins. Pt states she does not eat red meat.  Pt states she needs 3 SWL.    Pt states she is very frustrated with [facility] and their lack of communication. Pt states her psychologist Dr. Harrell Lark had her buy weight loss for dummies which is rittled with conflicting information. Pt states she cooks ahead of time for lunch and dinner the next day. Pt is extremely talkative.   Start weight at [facility]: 249.3 Wt:250 BMI: 45.56  MEDICATIONS: See List   DIETARY INTAKE:  24-hr recall:  B ( AM): 10 oz water with medication and then premier shake Snk ( AM): fruit L ( PM): [place] patties and green beans and salad with lettuce with tomato and cucumber with dressing Snk ( PM): fruit D ( PM): [place] patties and green beans and salad with lettuce with tomato and cucumber with dressing Snk ( PM):  Beverages: 64-120 oz water,   Usual physical activity: 15 minute intervals out to an hour most days of the week due to severe knee pain  Diet to Follow: 1500 calories 170 g carbohydrates 112 g protein 42 g fat   Nutritional Diagnosis:  [place]-3.3 Overweight/obesity related to past poor dietary habits and physical inactivity as evidenced by patient w/ planned Sleeve surgery following dietary guidelines for continued weight loss.    Intervention:  Nutrition counseling for upcoming Bariatric Surgery. Goals: -Encouraged to engage in 150 minutes of moderate physical activity including cardiovascular and weight baring weekly  Teaching Method Utilized:  Visual Auditory Hands on  Barriers to learning/adherence to lifestyle change: none identified   Demonstrated degree of understanding via:  Teach Back   Monitoring/Evaluation:  Dietary intake, exercise, and body weight prn.

## 2017-01-18 ENCOUNTER — Encounter: Payer: BLUE CROSS/BLUE SHIELD | Attending: Surgery | Admitting: Skilled Nursing Facility1

## 2017-01-18 ENCOUNTER — Encounter: Payer: Self-pay | Admitting: Skilled Nursing Facility1

## 2017-01-18 DIAGNOSIS — Z713 Dietary counseling and surveillance: Secondary | ICD-10-CM | POA: Insufficient documentation

## 2017-01-18 DIAGNOSIS — E669 Obesity, unspecified: Secondary | ICD-10-CM

## 2017-01-18 NOTE — Progress Notes (Signed)
Sleeve  Assessment:   2nd  SWL Appointment.   Pt states he is currently doing atkins. Pt states she does not eat red meat.  Pt states she needs 3 SWL.   Pt is extremely talkative.   Pt states she has been to several christmas events involving food. Pt states her knees have been hurting keeping her from being mobile using a topical ointment to try and help the pain. Pt states she has been suffering from migraines for many years. Pt states she is very ready for surgery. Pt states she does not drive at night. Pt states she feels Dr. Ardath Sax is testing her stateing he would ask her questions and when she answered he would yell saying "Wrong!" and would put his head in his hands covering face shaking head in disappointment.   Start weight at [facility]: 249.3 Wt:249 BMI: 45.53  MEDICATIONS: See List   DIETARY INTAKE:  24-hr recall:  B ( AM): 10 oz water with medication and then premier shake Snk ( AM): fruit L ( PM): [place] patties and green beans and salad with lettuce with tomato and cucumber with dressing---cauliflower mashed with green beans or broccoli and [place] patties or chicken  Snk ( PM): fruit D ( PM): [place] patties and green beans and salad with lettuce with tomato and cucumber with dressing Snk ( PM):  Beverages: 64-120 oz water,   Usual physical activity: 15 minute intervals out to an hour most days of the week due to severe knee pain  Diet to Follow: 1500 calories 170 g carbohydrates 112 g protein 42 g fat   Nutritional Diagnosis:  [place]-3.3 Overweight/obesity related to past poor dietary habits and physical inactivity as evidenced by patient w/ planned Sleeve surgery following dietary guidelines for continued weight loss.    Intervention:  Nutrition counseling for upcoming Bariatric Surgery. Goals: -Encouraged to engage in 150 minutes of moderate physical activity including cardiovascular and weight baring weekly  Teaching Method Utilized:  Visual Auditory Hands  on  Barriers to learning/adherence to lifestyle change: none identified   Demonstrated degree of understanding via:  Teach Back   Monitoring/Evaluation:  Dietary intake, exercise, and body weight prn.

## 2017-02-12 ENCOUNTER — Ambulatory Visit: Payer: BLUE CROSS/BLUE SHIELD | Admitting: Skilled Nursing Facility1

## 2017-02-12 ENCOUNTER — Ambulatory Visit: Payer: BLUE CROSS/BLUE SHIELD | Admitting: Registered"

## 2017-02-14 ENCOUNTER — Encounter: Payer: Self-pay | Admitting: Skilled Nursing Facility1

## 2017-02-14 ENCOUNTER — Encounter: Payer: BLUE CROSS/BLUE SHIELD | Attending: Surgery | Admitting: Skilled Nursing Facility1

## 2017-02-14 DIAGNOSIS — E669 Obesity, unspecified: Secondary | ICD-10-CM

## 2017-02-14 DIAGNOSIS — Z713 Dietary counseling and surveillance: Secondary | ICD-10-CM | POA: Diagnosis not present

## 2017-02-14 NOTE — Progress Notes (Signed)
Sleeve  Assessment:   3rd  SWL Appointment.   Pt states he is currently doing atkins. Pt states she does not eat red meat.  Pt is extremely talkative. Pt arrives having gained 4 pounds. Pt states she has not been as active due to the weather. Pt states she plans out her meals and trys to not to deviate from the plan. Pt states she tried sugar free jello and sugar free pudding. Pt states her doctor runs a full blood work panel every 6 months.   Start weight at [facility]: 249.3 Wt:253 BMI: 46.18  MEDICATIONS: See List   DIETARY INTAKE:  24-hr recall:  B ( AM): 10 oz water with medication and then premier shake Snk ( AM): fruit L ( PM): broth with [place] patty and salad or broccoli or green beans  Snk ( PM): fruit D ( PM):  broth with [place] patty and salad or broccoli or green beans Snk ( PM):  Beverages: 64-120 oz water,   Usual physical activity: 15 minute intervals out to an hour most days of the week due to severe knee pain; some weights in the house   Diet to Follow: 1500 calories 170 g carbohydrates 112 g protein 42 g fat   Nutritional Diagnosis:  [place]-3.3 Overweight/obesity related to past poor dietary habits and physical inactivity as evidenced by patient w/ planned Sleeve surgery following dietary guidelines for continued weight loss.    Intervention:  Nutrition counseling for upcoming Bariatric Surgery. Goals: -Encouraged to engage in 150 minutes of moderate physical activity including cardiovascular and weight baring weekly  Teaching Method Utilized:  Visual Auditory Hands on  Barriers to learning/adherence to lifestyle change: none identified   Demonstrated degree of understanding via:  Teach Back   Monitoring/Evaluation:  Dietary intake, exercise, and body weight prn.

## 2017-02-26 ENCOUNTER — Encounter: Payer: BLUE CROSS/BLUE SHIELD | Admitting: Registered"

## 2017-02-26 DIAGNOSIS — E669 Obesity, unspecified: Secondary | ICD-10-CM

## 2017-02-26 DIAGNOSIS — Z713 Dietary counseling and surveillance: Secondary | ICD-10-CM | POA: Diagnosis not present

## 2017-02-26 NOTE — Progress Notes (Signed)
  Pre-Operative Nutrition Class:  Appt start time: 8:15   End time: 9:15.  Patient was seen on 02/26/2017 for Pre-Operative Bariatric Surgery Education [facility].   Surgery date: TBD Surgery type: Sleeve Start weight at [facility]: 249.3 Weight today: 252.8   Samples given per MNT protocol. Patient educated on appropriate usage: Bariatric Advantage Multivitamin Lot # [id number] Exp: 07/2017  Bariatric Advantage Calcium Citrate (strawberry) Lot # [id number] Exp: 10/07/2017  Bariatric Advantage Calcium Citrate (tropical orange) Lot # [id number] Exp: 10/07/2017  [place]jury Protein Powder Lot # [id number] Exp: 11/16/2017  The following the learning objectives were met by the patient during this course:  Identify Pre-Op Dietary Goals and will begin 2 weeks pre-operatively  Identify appropriate sources of fluids and proteins   State protein recommendations and appropriate sources pre and post-operatively  Identify Post-Operative Dietary Goals and will follow for 2 weeks post-operatively  Identify appropriate multivitamin and calcium sources  Describe the need for physical activity post-operatively and will follow MD recommendations  State when to call healthcare provider regarding medication questions or post-operative complications  Handouts given during class include:  Pre-Op Bariatric Surgery Diet Handout  Protein Shake Handout  Post-Op Bariatric Surgery Nutrition Handout  [facility] Program Information Flyer  Support Group Information Flyer  [facility] Bariatric Supplements Price List  Follow-Up Plan: Patient will follow-up at [facility] 2 weeks post operatively for diet advancement per MD.

## 2017-03-09 ENCOUNTER — Ambulatory Visit: Payer: Self-pay | Admitting: Surgery

## 2017-03-09 NOTE — Progress Notes (Signed)
Please place orders in Epic as patient is being scheduled for a pre-op appointment! Thank you! 

## 2017-03-19 NOTE — Patient Instructions (Addendum)
Christie Fox  03/19/2017   Your procedure is scheduled on: 03-27-17   Report to [facility] Main  Entrance    Follow signs to Short Stay on first floor at 530 AM    Call this number if you have problems the morning of surgery [phone]     Remember: NO SOLID FOOD AFTER 600 PM THE NIGHT BEFORE YOUR SURGERY. YOU MAY DRINK CLEAR FLUIDS. MORNING OF SURGERY DRINK:  1SHAKE BEFORE YOU LEAVE HOME, DRINK ALL OF THE SHAKE AT ONE TIME.  THE SHAKE YOU DRINK BEFORE YOU LEAVE HOME WILL BE THE LAST FLUIDS YOU DRINK BEFORE SURGERY.   PAIN IS EXPECTED AFTER SURGERY AND WILL NOT BE COMPLETELY ELIMINATED. AMBULATION AND TYLENOL WILL HELP REDUCE INCISIONAL AND GAS PAIN. MOVEMENT IS KEY! YOU ARE EXPECTED TO BE OUT OF BED WITHIN 4 HOURS OF ADMISSION TO YOUR PATIENT ROOM. SITTING IN THE RECLINER THROUGHOUT THE DAY IS IMPORTANT FOR DRINKING FLUIDS AND MOVING GAS THROUGHOUT THE GI TRACT. COMPRESSION STOCKINGS SHOULD BE WORN [place] UNLESS YOU ARE WALKING.  INCENTIVE SPIROMETER SHOULD BE USED EVERY HOUR WHILE AWAKE TO DECREASE POST-OPERATIVE COMPLICATIONS SUCH AS PNEUMONIA. WHEN DISCHARGED HOME, IT IS IMPORTANT TO CONTINUE TO WALK EVERY HOUR AND USE THE INCENTIVE SPIROMETER EVERY HOUR.      Take these medicines the morning of surgery with A SIP OF WATER: xanax if needed, fenofibrate, levothyroxine, eye drops                                 You may not have any metal on your body including hair pins and              piercings  Do not wear jewelry, make-up, lotions, powders or perfumes, deodorant             Do not wear nail polish.  Do not shave  48 hours prior to surgery.             Do not bring valuables to the hospital. [place].  Contacts, dentures or bridgework may not be worn into surgery.  Leave suitcase in the car. After surgery it may be brought to your room.                Please read over the  following fact sheets you were given: _____________________________________________________________________      [facility] - Preparing for Surgery Before surgery, you can play an important role.  Because skin is not sterile, your skin needs to be as free of germs as possible.  You can reduce the number of germs on your skin by washing with CHG (chlorahexidine gluconate) soap before surgery.  CHG is an antiseptic cleaner which kills germs and bonds with the skin to continue killing germs even after washing. Please DO NOT use if you have an allergy to CHG or antibacterial soaps.  If your skin becomes reddened/irritated stop using the CHG and inform your nurse when you arrive at Short Stay. Do not shave (including legs and underarms) for at least 48 hours prior to the first CHG shower.  You may shave your face/neck. Please follow these instructions carefully:  1.  Shower with CHG Soap the night before surgery and the  morning of Surgery.  2.  If you choose to wash your hair, wash your hair first as usual with your  normal  shampoo.  3.  After you shampoo, rinse your hair and body thoroughly to remove the  shampoo.                           4.  Use CHG as you would any other liquid soap.  You can apply chg directly  to the skin and wash                       Gently with a scrungie or clean washcloth.  5.  Apply the CHG Soap to your body ONLY FROM THE NECK DOWN.   Do not use on face/ open                           Wound or open sores. Avoid contact with eyes, ears mouth and genitals (private parts).                       Wash face,  Genitals (private parts) with your normal soap.             6.  Wash thoroughly, paying special attention to the area where your surgery  will be performed.  7.  Thoroughly rinse your body with warm water from the neck down.  8.  DO NOT shower/wash with your normal soap after using and rinsing off  the CHG Soap.                9.  Pat yourself dry with a clean towel.             10.  Wear clean pajamas.            11.  Place clean sheets on your bed the night of your first shower and do not  sleep with pets. Day of Surgery : Do not apply any lotions/deodorants the morning of surgery.  Please wear clean clothes to the hospital/surgery center.  FAILURE TO FOLLOW THESE INSTRUCTIONS MAY RESULT IN THE CANCELLATION OF YOUR SURGERY PATIENT SIGNATURE_________________________________  NURSE SIGNATURE__________________________________  ________________________________________________________________________   Adam Phenix  An incentive spirometer is a tool that can help keep your lungs clear and active. This tool measures how well you are filling your lungs with each breath. Taking long deep breaths may help reverse or decrease the chance of developing breathing (pulmonary) problems (especially infection) following:  A long period of time when you are unable to move or be active. BEFORE THE PROCEDURE   If the spirometer includes an indicator to show your best effort, your nurse or respiratory therapist will set it to a desired goal.  If possible, sit up straight or lean slightly forward. Try not to slouch.  Hold the incentive spirometer in an upright position. INSTRUCTIONS FOR USE  1. Sit on the edge of your bed if possible, or sit up as far as you can in bed or on a chair. 2. Hold the incentive spirometer in an upright position. 3. Breathe out normally. 4. Place the mouthpiece in your mouth and seal your lips tightly around it. 5. Breathe in slowly and as deeply as possible, raising the piston or the ball toward the top of the column. 6. Hold your breath for 3-5 seconds or for as long as possible.  Allow the piston or ball to fall to the bottom of the column. 7. Remove the mouthpiece from your mouth and breathe out normally. 8. Rest for a few seconds and repeat Steps 1 through 7 at least 10 times every 1-2 hours when you are awake. Take your time and  take a few normal breaths between deep breaths. 9. The spirometer may include an indicator to show your best effort. Use the indicator as a goal to work toward during each repetition. 10. After each set of 10 deep breaths, practice coughing to be sure your lungs are clear. If you have an incision (the cut made at the time of surgery), support your incision when coughing by placing a pillow or rolled up towels firmly against it. Once you are able to get out of bed, walk around indoors and cough well. You may stop using the incentive spirometer when instructed by your caregiver.  RISKS AND COMPLICATIONS  Take your time so you do not get dizzy or light-headed.  If you are in pain, you may need to take or ask for pain medication before doing incentive spirometry. It is harder to take a deep breath if you are having pain. AFTER USE  Rest and breathe slowly and easily.  It can be helpful to keep track of a log of your progress. Your caregiver can provide you with a simple table to help with this. If you are using the spirometer at home, follow these instructions: [place] IF:   You are having difficultly using the spirometer.  You have trouble using the spirometer as often as instructed.  Your pain medication is not giving enough relief while using the spirometer.  You develop fever of 100.5 F (38.1 C) or higher. SEEK IMMEDIATE MEDICAL CARE IF:   You cough up bloody sputum that had not been present before.  You develop fever of 102 F (38.9 C) or greater.  You develop worsening pain at or near the incision site. MAKE SURE YOU:   Understand these instructions.  Will watch your condition.  Will get help right away if you are not doing well or get worse. Document Released: 05/29/2006 Document Revised: 04/10/2011 Document Reviewed: 07/30/2006 ExitCare Patient Information 2014 ExitCare,  [place].   ________________________________________________________________________    CLEAR LIQUID DIET   Foods Allowed                                                                     Foods Excluded  Coffee and tea, regular and decaf                             liquids that you cannot  Plain Jell-O in any flavor                                             see through such as: Fruit ices (not with fruit pulp)  milk, soups, orange juice  Iced Popsicles                                    All solid food Carbonated beverages, regular and diet                                    Cranberry, grape and apple juices Sports drinks like Gatorade Lightly seasoned clear broth or consume(fat free) Sugar, honey syrup  Sample Menu Breakfast                                Lunch                                     Supper Cranberry juice                    Beef broth                            Chicken broth Jell-O                                     Grape juice                           Apple juice Coffee or tea                        Jell-O                                      Popsicle                                                Coffee or tea                        Coffee or tea  _____________________________________________________________________

## 2017-03-19 NOTE — Progress Notes (Signed)
EKG 12-01-16 Epic  CXR 12-01-16 Epic

## 2017-03-20 ENCOUNTER — Other Ambulatory Visit: Payer: Self-pay

## 2017-03-20 ENCOUNTER — Encounter (HOSPITAL_COMMUNITY): Payer: Self-pay

## 2017-03-20 ENCOUNTER — Encounter (HOSPITAL_COMMUNITY)
Admission: RE | Admit: 2017-03-20 | Discharge: 2017-03-20 | Disposition: A | Discharge status: Home / Self Care | Payer: BLUE CROSS/BLUE SHIELD | Source: Ambulatory Visit | Attending: Surgery | Admitting: Surgery

## 2017-03-20 DIAGNOSIS — Z01818 Encounter for other preprocedural examination: Secondary | ICD-10-CM | POA: Insufficient documentation

## 2017-03-20 LAB — CBC WITH DIFFERENTIAL/PLATELET
BASOS PCT: 1 %
Basophils Absolute: 0 10*3/uL (ref 0.0–0.1)
EOS ABS: 0.1 10*3/uL (ref 0.0–0.7)
Eosinophils Relative: 2 %
HCT: 43.2 % (ref 36.0–46.0)
HEMOGLOBIN: 14.5 g/dL (ref 12.0–15.0)
Lymphocytes Relative: 14 %
Lymphs Abs: 1.2 10*3/uL (ref 0.7–4.0)
MCH: 32.9 pg (ref 26.0–34.0)
MCHC: 33.6 g/dL (ref 30.0–36.0)
MCV: 98 fL (ref 78.0–100.0)
MONOS PCT: 10 %
Monocytes Absolute: 0.8 10*3/uL (ref 0.1–1.0)
NEUTROS PCT: 73 %
Neutro Abs: 6.1 10*3/uL (ref 1.7–7.7)
PLATELETS: 319 10*3/uL (ref 150–400)
RBC: 4.41 MIL/uL (ref 3.87–5.11)
RDW: 12.3 % (ref 11.5–15.5)
WBC: 8.2 10*3/uL (ref 4.0–10.5)

## 2017-03-20 LAB — COMPREHENSIVE METABOLIC PANEL
ALBUMIN: 4.5 g/dL (ref 3.5–5.0)
ALK PHOS: 59 U/L (ref 38–126)
ALT: 27 U/L (ref 14–54)
ANION GAP: 12 (ref 5–15)
AST: 32 U/L (ref 15–41)
BUN: 17 mg/dL (ref 6–20)
CO2: 25 mmol/L (ref 22–32)
Calcium: 9.6 mg/dL (ref 8.9–10.3)
Chloride: 104 mmol/L (ref 101–111)
Creatinine, Ser: 0.76 mg/dL (ref 0.44–1.00)
GFR calc Af Amer: >60 mL/min (ref >60)
GFR calc non Af Amer: >60 mL/min (ref >60)
GLUCOSE: 107 mg/dL — AB (ref 65–99)
POTASSIUM: 4.4 mmol/L (ref 3.5–5.1)
SODIUM: 141 mmol/L (ref 135–145)
Total Bilirubin: 0.6 mg/dL (ref 0.3–1.2)
Total Protein: 8 g/dL (ref 6.5–8.1)

## 2017-03-20 LAB — ABO/RH: ABO/RH(D): A POS

## 2017-03-20 LAB — HEMOGLOBIN A1C
Hgb A1c MFr Bld: 5.8 % — ABNORMAL HIGH (ref 4.8–5.6)
Mean Plasma Glucose: 119.76 mg/dL

## 2017-03-26 NOTE — H&P (Signed)
Christie Fox  Location: [facility] Patient #: [id number] DOB: 10-Apr-1955 Married / Language: English / Race: White Female  History of Present Illness   The patient is a 62 year old female who presents with a complaint of weight loss surgery.  The PCP is Christie Shorter, PA ([place])  The patient was referred by Christie Shorter, PA [facility])  She comes by herself.  She is ready for surgery. Her surgery is set for 26-Apr-2017. Her husband's father died yesterday, so he is not here with her.  UGI - 12/01/2016 - Normal She has seen Dr. Ardath Fox for psych  History of weight issues: The patient is interested in weight loss surgery. She is interested in a sleeve gastrectomy. She went to a seminar that I gave for weight loss. She has a a coworker who had sleeve gastrectomies done well. She has tried multiple diets including Weight Watchers, Orvan Seen, Rickard Patience, Nutrisystem, protein shakes, [employer] (they closed and moved to [place]), and [facility]. She was most successful with [employer] where she lost 65 pounds over 1 year. She's tried phentermine as well as a prescription medicine without any benefit. She knows that her weakness is carbs. She brought her summer lab work with her.  Per the [employer], the patient is a candidate for bariatric surgery. The patient attended our initial information session and reviewed the types of bariatric surgery.  The patient is interested in the sleeve gastrectomy. I discussed with the patient the indications and risks of bariatric surgery. The potential risks of surgery include, but are not limited to, bleeding, infection, leak from the bowel, DVT and PE, open surgery, long term nutrition consequences, and death. The patient understands the importance of compliance and long term follow-up with our group after surgery. I again went over this with  her.  Past Medical History: 1. HIstory of left kidney stone Dr. Clint Fox 2. HTN x 10 years 3. Arthritis involving both knees 4. Back surgery x 2 - Christie Fox Last surgery around 2013 5. Left ankle surgery after fall in 2002 - Christie Fox 6. Colonoscopy about 2013 7. Bilateral salpingo-oophorectomy - 2003 - Christie Fox 8. Remote history of migraine headaches - last one 15 years ago 72. Dr. Ardath Fox for psych - he helped her with some of her anxiety issues  Social History: Married. Husband Christie Fox. Husband in [place]. He works for [employer] - development. Her husband does most of the cooking. Has 2 children: 56 yo daughter and 32 yo daughter, 5 grandchildren.   So pressure from home - her father recently was moved into a nursing home in [facility], Sister has Down's syndrome        Her husband's father died 2017-04-11, so he is not here with her.  Allergies Christie Fox, CMA; 03/13/2017 2:42 PM) No Known Drug Allergies [11/21/2016]: Allergies Reconciled   Medication History Christie Fox, CMA; 03/13/2017 2:42 PM) ALPRAZolam (1MG  Tablet, Oral) Active. Rosuvastatin Calcium (10MG  Tablet, Oral) Active. Perindopril Erbumine (4MG  Tablet, Oral) Active. Furosemide (40MG  Tablet, Oral) Active. Levothyroxine Sodium (100MCG Tablet, Oral) Active. Medications Reconciled  Vitals (Christie Fox CMA; 03/13/2017 2:43 PM) 03/13/2017 2:42 PM Weight: 251.5 lb Height: 62.5in Body Surface Area: 2.12 m Body Mass Index: 45.27 kg/m  Temp.: 98.45F(Oral)  Pulse: 116 (Regular)  BP: 150/100 (Sitting, Left Arm, Standard)   Physical Exam  General: Obese WF alert and generally healthy appearing.  HEENT: Normal. Pupils equal. False eyelashes  Neck: Supple. No mass. No thyroid mass.  Lymph Nodes:  No supraclavicular or cervical nodes.  Lungs: Clear to auscultation and symmetric breath sounds. Heart: RRR. No murmur or rub.  Abdomen: Soft. No mass. No tenderness. No hernia.  Normal bowel sounds. Pfannenstiel incision. She is about 1/2 apple and 1/2 pear Rectal: Not done.  Extremities: Good strength and ROM in upper and lower extremities.  Neurologic: Grossly intact to motor and sensory function. Psychiatric: Has normal mood and affect. Behavior is normal.   Assessment & Plan  1.  MORBID OBESITY (E66.01)  Plan:  1) Gave prescriptions for pain, nausea, and anti ulcer meds   2) for sleeve gastrectomy on 03/27/2017  2.  HYPERTENSION, BENIGN (I10)  X 10 years. 3. HIstory of left kidney stone Dr. Clint Fox 4. Arthritis involving both knees 5. Back surgery x 2 - Christie Fox Last surgery around 2013 6. Left ankle surgery after fall in 2002 - Christie Fox 7. Bilateral salpingo-oophorectomy - 2003 - Christie Fox   Alphonsa Overall, MD, [facility] Pager: [phone] Office phone:  [phone]

## 2017-03-27 ENCOUNTER — Inpatient Hospital Stay (HOSPITAL_COMMUNITY): Payer: BLUE CROSS/BLUE SHIELD | Admitting: Anesthesiology

## 2017-03-27 ENCOUNTER — Encounter (HOSPITAL_COMMUNITY)
Admission: RE | Disposition: A | Discharge status: Home / Self Care | Payer: Self-pay | Source: Ambulatory Visit | Attending: Surgery

## 2017-03-27 ENCOUNTER — Encounter (HOSPITAL_COMMUNITY): Payer: Self-pay | Admitting: *Deleted

## 2017-03-27 ENCOUNTER — Other Ambulatory Visit: Payer: Self-pay

## 2017-03-27 ENCOUNTER — Inpatient Hospital Stay (HOSPITAL_COMMUNITY)
Admission: RE | Admit: 2017-03-27 | Discharge: 2017-03-28 | DRG: 621 | Disposition: A | Discharge status: Home / Self Care | Payer: BLUE CROSS/BLUE SHIELD | Source: Ambulatory Visit | Attending: Surgery | Admitting: Surgery

## 2017-03-27 DIAGNOSIS — Z90722 Acquired absence of ovaries, bilateral: Secondary | ICD-10-CM | POA: Diagnosis not present

## 2017-03-27 DIAGNOSIS — I10 Essential (primary) hypertension: Secondary | ICD-10-CM | POA: Diagnosis present

## 2017-03-27 DIAGNOSIS — Z9181 History of falling: Secondary | ICD-10-CM | POA: Diagnosis not present

## 2017-03-27 DIAGNOSIS — Z8781 Personal history of (healed) traumatic fracture: Secondary | ICD-10-CM

## 2017-03-27 DIAGNOSIS — Z87442 Personal history of urinary calculi: Secondary | ICD-10-CM | POA: Diagnosis not present

## 2017-03-27 DIAGNOSIS — Z6841 Body Mass Index (BMI) 40.0 and over, adult: Secondary | ICD-10-CM | POA: Diagnosis not present

## 2017-03-27 DIAGNOSIS — M170 Bilateral primary osteoarthritis of knee: Secondary | ICD-10-CM | POA: Diagnosis present

## 2017-03-27 DIAGNOSIS — F419 Anxiety disorder, unspecified: Secondary | ICD-10-CM | POA: Diagnosis present

## 2017-03-27 DIAGNOSIS — E039 Hypothyroidism, unspecified: Secondary | ICD-10-CM | POA: Diagnosis present

## 2017-03-27 HISTORY — PX: LAPAROSCOPIC GASTRIC SLEEVE RESECTION: SHX5895

## 2017-03-27 LAB — HEMOGLOBIN AND HEMATOCRIT, BLOOD
HEMATOCRIT: 43.9 % (ref 36.0–46.0)
HEMOGLOBIN: 14.7 g/dL (ref 12.0–15.0)

## 2017-03-27 LAB — TYPE AND SCREEN
ABO/RH(D): A POS
Antibody Screen: NEGATIVE

## 2017-03-27 LAB — PREGNANCY, URINE: PREG TEST UR: NEGATIVE

## 2017-03-27 SURGERY — GASTRECTOMY, SLEEVE, LAPAROSCOPIC
Anesthesia: General

## 2017-03-27 MED ORDER — ONDANSETRON HCL 4 MG/2ML IJ SOLN
INTRAMUSCULAR | Status: AC
  Filled 2017-03-27: qty 2

## 2017-03-27 MED ORDER — BUPIVACAINE HCL (PF) 0.25 % IJ SOLN
INTRAMUSCULAR | Status: AC
  Filled 2017-03-27: qty 30

## 2017-03-27 MED ORDER — TISSEEL VH 10 ML EX KIT
PACK | CUTANEOUS | Status: DC | PRN
  Administered 2017-03-27: 1

## 2017-03-27 MED ORDER — LIDOCAINE 2% (20 MG/ML) 5 ML SYRINGE
INTRAMUSCULAR | Status: DC | PRN
  Administered 2017-03-27: 80 mg via INTRAVENOUS

## 2017-03-27 MED ORDER — LACTATED RINGERS IV SOLN: INTRAVENOUS | Status: DC

## 2017-03-27 MED ORDER — CHLORHEXIDINE GLUCONATE 4 % EX LIQD: 60.0000 mL | Freq: Once | CUTANEOUS | Status: DC

## 2017-03-27 MED ORDER — SUGAMMADEX SODIUM 500 MG/5ML IV SOLN
INTRAVENOUS | Status: AC
  Filled 2017-03-27: qty 5

## 2017-03-27 MED ORDER — APREPITANT 40 MG PO CAPS
40.0000 mg | ORAL_CAPSULE | ORAL | Status: AC
  Administered 2017-03-27: 40 mg via ORAL
  Filled 2017-03-27: qty 1

## 2017-03-27 MED ORDER — ROCURONIUM BROMIDE 10 MG/ML (PF) SYRINGE
PREFILLED_SYRINGE | INTRAVENOUS | Status: DC | PRN
  Administered 2017-03-27: 10 mg via INTRAVENOUS
  Administered 2017-03-27: 50 mg via INTRAVENOUS

## 2017-03-27 MED ORDER — SUGAMMADEX SODIUM 500 MG/5ML IV SOLN
INTRAVENOUS | Status: DC | PRN
  Administered 2017-03-27: 240 mg via INTRAVENOUS

## 2017-03-27 MED ORDER — FENTANYL CITRATE (PF) 100 MCG/2ML IJ SOLN
50.0000 ug | INTRAMUSCULAR | Status: AC | PRN
  Administered 2017-03-27 (×2): 50 ug via INTRAVENOUS

## 2017-03-27 MED ORDER — PREMIER PROTEIN SHAKE: 2.0000 [oz_av] | ORAL | Status: DC

## 2017-03-27 MED ORDER — TISSEEL VH 10 ML EX KIT: PACK | Freq: Once | CUTANEOUS | Status: DC

## 2017-03-27 MED ORDER — FENTANYL CITRATE (PF) 100 MCG/2ML IJ SOLN
INTRAMUSCULAR | Status: DC | PRN
  Administered 2017-03-27 (×2): 50 ug via INTRAVENOUS
  Administered 2017-03-27: 100 ug via INTRAVENOUS

## 2017-03-27 MED ORDER — HEPARIN SODIUM (PORCINE) 5000 UNIT/ML IJ SOLN: 5000.0000 [IU] | Freq: Three times a day (TID) | INTRAMUSCULAR | Status: DC

## 2017-03-27 MED ORDER — ONDANSETRON HCL 4 MG/2ML IJ SOLN
INTRAMUSCULAR | Status: DC | PRN
  Administered 2017-03-27: 4 mg via INTRAVENOUS

## 2017-03-27 MED ORDER — KCL IN DEXTROSE-NACL 20-5-0.45 MEQ/L-%-% IV SOLN
INTRAVENOUS | Status: DC
  Administered 2017-03-27 (×2): via INTRAVENOUS
  Filled 2017-03-27 (×3): qty 1000

## 2017-03-27 MED ORDER — FENTANYL CITRATE (PF) 100 MCG/2ML IJ SOLN
INTRAMUSCULAR | Status: AC
  Administered 2017-03-27: 50 ug
  Filled 2017-03-27: qty 2

## 2017-03-27 MED ORDER — LACTATED RINGERS IV SOLN
INTRAVENOUS | Status: DC | PRN
  Administered 2017-03-27: 07:00:00 via INTRAVENOUS

## 2017-03-27 MED ORDER — LACTATED RINGERS IR SOLN
Status: DC | PRN
  Administered 2017-03-27: 1000 mL

## 2017-03-27 MED ORDER — MORPHINE SULFATE (PF) 2 MG/ML IV SOLN
1.0000 mg | INTRAVENOUS | Status: DC | PRN
  Administered 2017-03-27 (×2): 2 mg via INTRAVENOUS
  Filled 2017-03-27 (×2): qty 1

## 2017-03-27 MED ORDER — DEXAMETHASONE SODIUM PHOSPHATE 10 MG/ML IJ SOLN
INTRAMUSCULAR | Status: AC
  Filled 2017-03-27: qty 1

## 2017-03-27 MED ORDER — SCOPOLAMINE 1 MG/3DAYS TD PT72
1.0000 | MEDICATED_PATCH | TRANSDERMAL | Status: DC
  Administered 2017-03-27: 1.5 mg via TRANSDERMAL
  Filled 2017-03-27: qty 1

## 2017-03-27 MED ORDER — CEFOTETAN DISODIUM-DEXTROSE 2-2.08 GM-%(50ML) IV SOLR
2.0000 g | INTRAVENOUS | Status: AC
  Administered 2017-03-27: 2 g via INTRAVENOUS
  Filled 2017-03-27: qty 50

## 2017-03-27 MED ORDER — PANTOPRAZOLE SODIUM 40 MG IV SOLR
40.0000 mg | Freq: Every day | INTRAVENOUS | Status: DC
  Administered 2017-03-27: 40 mg via INTRAVENOUS
  Filled 2017-03-27: qty 40

## 2017-03-27 MED ORDER — PROMETHAZINE HCL 25 MG/ML IJ SOLN
12.5000 mg | Freq: Once | INTRAMUSCULAR | Status: AC
  Administered 2017-03-27: 12.5 mg via INTRAVENOUS

## 2017-03-27 MED ORDER — SODIUM CHLORIDE 0.9 % IJ SOLN
INTRAMUSCULAR | Status: AC
  Filled 2017-03-27: qty 50

## 2017-03-27 MED ORDER — PROPOFOL 10 MG/ML IV BOLUS
INTRAVENOUS | Status: DC | PRN
  Administered 2017-03-27: 170 mg via INTRAVENOUS

## 2017-03-27 MED ORDER — TISSEEL VH 10 ML EX KIT
PACK | CUTANEOUS | Status: AC
  Filled 2017-03-27: qty 2

## 2017-03-27 MED ORDER — MIDAZOLAM HCL 2 MG/2ML IJ SOLN
INTRAMUSCULAR | Status: AC
  Filled 2017-03-27: qty 2

## 2017-03-27 MED ORDER — FENTANYL CITRATE (PF) 100 MCG/2ML IJ SOLN
INTRAMUSCULAR | Status: AC
  Filled 2017-03-27: qty 2

## 2017-03-27 MED ORDER — ROCURONIUM BROMIDE 10 MG/ML (PF) SYRINGE
PREFILLED_SYRINGE | INTRAVENOUS | Status: AC
  Filled 2017-03-27: qty 5

## 2017-03-27 MED ORDER — HEPARIN SODIUM (PORCINE) 5000 UNIT/ML IJ SOLN
5000.0000 [IU] | INTRAMUSCULAR | Status: AC
  Administered 2017-03-27: 5000 [IU] via SUBCUTANEOUS
  Filled 2017-03-27: qty 1

## 2017-03-27 MED ORDER — BUPIVACAINE LIPOSOME 1.3 % IJ SUSP
20.0000 mL | Freq: Once | INTRAMUSCULAR | Status: AC
Start: 2017-03-27 — End: 2017-03-27
  Administered 2017-03-27: 20 mL
  Filled 2017-03-27: qty 20

## 2017-03-27 MED ORDER — BUPIVACAINE HCL 0.25 % IJ SOLN
INTRAMUSCULAR | Status: DC | PRN
  Administered 2017-03-27: 30 mL

## 2017-03-27 MED ORDER — PROPOFOL 10 MG/ML IV BOLUS
INTRAVENOUS | Status: AC
Start: 2017-03-27 — End: 2017-03-27
  Filled 2017-03-27: qty 20

## 2017-03-27 MED ORDER — OXYCODONE HCL 5 MG/5ML PO SOLN
5.0000 mg | ORAL | Status: DC | PRN
  Administered 2017-03-27 – 2017-03-28 (×3): 5 mg via ORAL
  Filled 2017-03-27 (×3): qty 5

## 2017-03-27 MED ORDER — ONDANSETRON HCL 4 MG/2ML IJ SOLN: INTRAMUSCULAR | Status: DC | PRN

## 2017-03-27 MED ORDER — PROMETHAZINE HCL 25 MG/ML IJ SOLN
INTRAMUSCULAR | Status: AC
  Filled 2017-03-27: qty 1

## 2017-03-27 MED ORDER — ONDANSETRON HCL 4 MG/2ML IJ SOLN
4.0000 mg | INTRAMUSCULAR | Status: DC | PRN
  Administered 2017-03-27: 4 mg via INTRAVENOUS
  Filled 2017-03-27: qty 2

## 2017-03-27 MED ORDER — ACETAMINOPHEN 160 MG/5ML PO SOLN
650.0000 mg | ORAL | Status: DC | PRN
  Administered 2017-03-28 (×2): 650 mg via ORAL
  Filled 2017-03-27 (×2): qty 20.3

## 2017-03-27 MED ORDER — DEXAMETHASONE SODIUM PHOSPHATE 10 MG/ML IJ SOLN
INTRAMUSCULAR | Status: DC | PRN
  Administered 2017-03-27: 10 mg via INTRAVENOUS

## 2017-03-27 MED ORDER — ONDANSETRON HCL 4 MG/2ML IJ SOLN
4.0000 mg | Freq: Four times a day (QID) | INTRAMUSCULAR | Status: AC | PRN
  Administered 2017-03-27: 4 mg via INTRAVENOUS

## 2017-03-27 MED ORDER — 0.9 % SODIUM CHLORIDE (POUR BTL) OPTIME
TOPICAL | Status: DC | PRN
Start: 2017-03-27 — End: 2017-03-27
  Administered 2017-03-27: 1000 mL

## 2017-03-27 MED ORDER — ACETAMINOPHEN 500 MG PO TABS
1000.0000 mg | ORAL_TABLET | ORAL | Status: AC
  Administered 2017-03-27: 1000 mg via ORAL
  Filled 2017-03-27: qty 2

## 2017-03-27 MED ORDER — MIDAZOLAM HCL 5 MG/5ML IJ SOLN
INTRAMUSCULAR | Status: DC | PRN
  Administered 2017-03-27: 2 mg via INTRAVENOUS

## 2017-03-27 MED ORDER — LIDOCAINE 2% (20 MG/ML) 5 ML SYRINGE
INTRAMUSCULAR | Status: DC | PRN
  Administered 2017-03-27: 1 mg/kg/h via INTRAVENOUS

## 2017-03-27 MED ORDER — HEPARIN SODIUM (PORCINE) 5000 UNIT/ML IJ SOLN
5000.0000 [IU] | Freq: Three times a day (TID) | INTRAMUSCULAR | Status: DC
  Administered 2017-03-27 – 2017-03-28 (×2): 5000 [IU] via SUBCUTANEOUS
  Filled 2017-03-27 (×2): qty 1

## 2017-03-27 MED ORDER — LIDOCAINE 2% (20 MG/ML) 5 ML SYRINGE
INTRAMUSCULAR | Status: AC
  Filled 2017-03-27: qty 5

## 2017-03-27 SURGICAL SUPPLY — 58 items
APPLICATOR COTTON TIP 6IN STRL (MISCELLANEOUS) IMPLANT
APPLIER CLIP ROT 10 11.4 M/L (STAPLE) ×3
APPLIER CLIP ROT 13.4 12 LRG (CLIP) ×3
BLADE SURG 15 STRL LF DISP TIS (BLADE) ×1 IMPLANT
BLADE SURG 15 STRL SS (BLADE) ×2
CABLE HIGH FREQUENCY MONO STRZ (ELECTRODE) ×3 IMPLANT
CHLORAPREP W/TINT 26ML (MISCELLANEOUS) ×3 IMPLANT
CLIP APPLIE ROT 10 11.4 M/L (STAPLE) ×1 IMPLANT
CLIP APPLIE ROT 13.4 12 LRG (CLIP) ×1 IMPLANT
DERMABOND ADVANCED (GAUZE/BANDAGES/DRESSINGS) ×2
DERMABOND ADVANCED .7 DNX12 (GAUZE/BANDAGES/DRESSINGS) ×1 IMPLANT
DEVICE SUT QUICK LOAD TK 5 (STAPLE) IMPLANT
DEVICE SUT TI-KNOT TK 5X26 (MISCELLANEOUS) IMPLANT
DEVICE SUTURE ENDOST 10MM (ENDOMECHANICALS) IMPLANT
DEVICE TI KNOT TK5 (MISCELLANEOUS)
DISSECTOR BLUNT TIP ENDO 5MM (MISCELLANEOUS) IMPLANT
DRAPE UTILITY XL STRL (DRAPES) ×6 IMPLANT
ELECT REM PT RETURN 15FT ADLT (MISCELLANEOUS) ×3 IMPLANT
GAUZE SPONGE 4X4 12PLY STRL (GAUZE/BANDAGES/DRESSINGS) IMPLANT
GLOVE SURG SIGNA 7.5 PF LTX (GLOVE) ×3 IMPLANT
GOWN STRL REUS W/TWL XL LVL3 (GOWN DISPOSABLE) ×9 IMPLANT
GRASPER SUT TROCAR 14GX15 (MISCELLANEOUS) IMPLANT
HOVERMATT SINGLE USE (MISCELLANEOUS) ×3 IMPLANT
KIT BASIN OR (CUSTOM PROCEDURE TRAY) ×3 IMPLANT
MARKER SKIN DUAL TIP RULER LAB (MISCELLANEOUS) ×3 IMPLANT
NEEDLE SPNL 22GX3.5 QUINCKE BK (NEEDLE) ×3 IMPLANT
PACK UNIVERSAL I (CUSTOM PROCEDURE TRAY) ×3 IMPLANT
QUICK LOAD TK 5 (STAPLE)
RELOAD STAPLER BLUE 60MM (STAPLE) ×1 IMPLANT
RELOAD STAPLER GOLD 60MM (STAPLE) ×2 IMPLANT
RELOAD STAPLER GREEN 60MM (STAPLE) ×2 IMPLANT
SCISSORS LAP 5X35 DISP (ENDOMECHANICALS) ×3 IMPLANT
SEALANT SURGICAL APPL DUAL CAN (MISCELLANEOUS) ×3 IMPLANT
SET IRRIG TUBING LAPAROSCOPIC (IRRIGATION / IRRIGATOR) ×3 IMPLANT
SHEARS HARMONIC ACE PLUS 45CM (MISCELLANEOUS) ×3 IMPLANT
SLEEVE ADV FIXATION 5X100MM (TROCAR) ×6 IMPLANT
SLEEVE GASTRECTOMY 36FR VISIGI (MISCELLANEOUS) ×3 IMPLANT
SOLUTION ANTI FOG 6CC (MISCELLANEOUS) ×3 IMPLANT
SPONGE LAP 18X18 X RAY DECT (DISPOSABLE) ×3 IMPLANT
STAPLER ECHELON LONG 60 440 (INSTRUMENTS) ×3 IMPLANT
STAPLER RELOAD BLUE 60MM (STAPLE) ×3
STAPLER RELOAD GOLD 60MM (STAPLE) ×6
STAPLER RELOAD GREEN 60MM (STAPLE) ×6
SUT MNCRL AB 4-0 PS2 18 (SUTURE) ×3 IMPLANT
SUT SURGIDAC NAB ES-9 0 48 120 (SUTURE) IMPLANT
SUT VICRYL 0 TIES 12 18 (SUTURE) IMPLANT
SYR 10ML ECCENTRIC (SYRINGE) ×3 IMPLANT
SYR CONTROL 10ML LL (SYRINGE) ×3 IMPLANT
TOWEL OR 17X26 10 PK STRL BLUE (TOWEL DISPOSABLE) ×3 IMPLANT
TOWEL OR NON WOVEN STRL DISP B (DISPOSABLE) ×3 IMPLANT
TROCAR ADV FIXATION 12X100MM (TROCAR) ×3 IMPLANT
TROCAR ADV FIXATION 5X100MM (TROCAR) ×3 IMPLANT
TROCAR BLADELESS 15MM (ENDOMECHANICALS) ×3 IMPLANT
TROCAR BLADELESS OPT 5 100 (ENDOMECHANICALS) ×3 IMPLANT
TUBING CONNECTING 10 (TUBING) ×4 IMPLANT
TUBING CONNECTING 10' (TUBING) ×2
TUBING ENDO SMARTCAP PENTAX (MISCELLANEOUS) ×3 IMPLANT
TUBING INSUF HEATED (TUBING) ×3 IMPLANT

## 2017-03-27 NOTE — Discharge Instructions (Signed)
° ° ° °GASTRIC BYPASS/SLEEVE ° Home Care Instructions ° ° These instructions are to help you care for yourself when you go home. ° °Call: If you have any problems. °• Call [phone] and ask for the surgeon on call °• If you need immediate help, come to the ER at [facility].  °• Tell the ER staff that you are a new post-op gastric bypass or gastric sleeve patient °  °Signs and symptoms to report: • Severe vomiting or nausea °o If you cannot keep down clear liquids for longer than 1 day, call your surgeon  °• Abdominal pain that does not get better after taking your pain medication °• Fever over 100.4° F with chills °• Heart beating over 100 beats a minute °• Shortness of breath at rest °• Chest pain °•  Redness, swelling, drainage, or foul odor at incision (surgical) sites °•  If your incisions open or pull apart °• Swelling or pain in calf (lower leg) °• Diarrhea (Loose bowel movements that happen often), frequent watery, uncontrolled bowel movements °• Constipation, (no bowel movements for 3 days) if this happens: Pick one °o Milk of Magnesia, 2 tablespoons by mouth, 3 times a day for 2 days if needed °o Stop taking Milk of Magnesia once you have a bowel movement °o Call your doctor if constipation continues °Or °o Miralax  (instead of Milk of Magnesia) following the label instructions °o Stop taking Miralax once you have a bowel movement °o Call your doctor if constipation continues °• Anything you think is not normal °  °Normal side effects after surgery: • Unable to sleep at night or unable to focus °• Irritability or moody °• Being tearful (crying) or depressed °These are common complaints, possibly related to your anesthesia medications that put you to sleep, stress of surgery, and change in lifestyle.  This usually goes away a few weeks after surgery.  If these feelings continue, call your primary care doctor. °  °Wound Care: You may have surgical glue, steri-strips, or staples over your incisions after  surgery °• Surgical glue:  Looks like a clear film over your incisions and will wear off a little at a time °• Steri-strips: Strips of tape over your incisions. You may notice a yellowish color on the skin under the steri-strips. This is used to make the   steri-strips stick better. Do not pull the steri-strips off - let them fall off °• Staples: Staples may be removed before you leave the hospital °o If you go home with staples, call [facility], [phone] at for an appointment with your surgeon’s nurse to have staples removed 10 days after surgery. °• Showering: You may shower two (2) days after your surgery unless your surgeon tells you differently °o Wash gently around incisions with warm soapy water, rinse well, and gently pat dry  °o No tub baths until staples are removed, steri-strips fall off or glue is gone.  °  °Medications: • Medications should be liquid or crushed if larger than the size of a dime °• Extended release pills (medication that release a little bit at a time through the day) should NOT be crushed or cut. (examples include XL, ER, DR, SR) °• Depending on the size and number of medications you take, you may need to space (take a few throughout the day)/change the time you take your medications so that you do not over-fill your pouch (smaller stomach) °• Make sure you follow-up with your primary care doctor to   make medication changes needed during rapid weight loss and life-style changes °• If you have diabetes, follow up with the doctor that orders your diabetes medication(s) within one week after surgery and check your blood sugar regularly. °• Do not drive while taking prescription pain medication  °• It is ok to take Tylenol by the bottle instructions with your pain medicine or instead of your pain medicine as needed.  DO NOT TAKE NSAIDS (EXAMPLES OF NSAIDS:  IBUPROFREN/ NAPROXEN)  °Diet:                    First 2 Weeks ° You will see the dietician t about two (2) weeks  after your surgery. The dietician will increase the types of foods you can eat if you are handling liquids well: °• If you have severe vomiting or nausea and cannot keep down clear liquids lasting longer than 1 day, call your surgeon @ ([phone]) °Protein Shake °• Drink at least 2 ounces of shake 5-6 times per day °• Each serving of protein shakes (usually 8 - 12 ounces) should have: °o 15 grams of protein  °o And no more than 5 grams of carbohydrate  °• Goal for protein each day: °o Men = 80 grams per day °o Women = 60 grams per day °• Protein powder may be added to fluids such as non-fat milk or Lactaid milk or unsweetened Soy/Almond milk (limit to 35 grams added protein powder per serving) ° °Hydration °• Slowly increase the amount of water and other clear liquids as tolerated (See Acceptable Fluids) °• Slowly increase the amount of protein shake as tolerated  °•  Sip fluids slowly and throughout the day.  Do not use straws. °• May use sugar substitutes in small amounts (no more than 6 - 8 packets per day; i.e. Splenda) ° °Fluid Goal °• The first goal is to drink at least 8 ounces of protein shake/drink per day (or as directed by the nutritionist); some examples of protein shakes are Syntrax Nectar, Adkins Advantage, EAS Edge HP, and Unjury. See handout from pre-op Bariatric Education Class: °o Slowly increase the amount of protein shake you drink as tolerated °o You may find it easier to slowly sip shakes throughout the day °o It is important to get your proteins in first °• Your fluid goal is to drink 64 - 100 ounces of fluid daily °o It may take a few weeks to build up to this °• 32 oz (or more) should be clear liquids  °And  °• 32 oz (or more) should be full liquids (see below for examples) °• Liquids should not contain sugar, caffeine, or carbonation ° °Clear Liquids: °• Water or Sugar-free flavored water (i.e. Fruit H2O, Propel) °• Decaffeinated coffee or tea (sugar-free) °• Crystal Lite, Wyler’s Lite,  Minute Maid Lite °• Sugar-free Jell-O °• Bouillon or broth °• Sugar-free Popsicle:   *Less than 20 calories each; Limit 1 per day ° °Full Liquids: °Protein Shakes/Drinks + 2 choices per day of other full liquids °• Full liquids must be: °o No More Than 15 grams of Carbs per serving  °o No More Than 3 grams of Fat per serving °• Strained low-fat cream soup (except Cream of Potato or Tomato) °• Non-Fat milk °• Fat-free Lactaid Milk °• Unsweetened Soy Or Unsweetened Almond Milk °• Low Sugar yogurt (Dannon Lite & Fit, Greek yogurt; Oikos Triple Zero; Chobani Simply 100; Yoplait 100 calorie Greek - No Fruit on the Bottom) ° °  °Vitamins   and Minerals • Start 1 day after surgery unless otherwise directed by your surgeon °• 2 Chewable Bariatric Specific Multivitamin / Multimineral Supplement with iron (Example: Bariatric Advantage Multi EA) °• Chewable Calcium with Vitamin D-3 °(Example: 3 Chewable Calcium Plus 600 with Vitamin D-3) °o Take 500 mg three (3) times a day for a total of 1500 mg each day °o Do not take all 3 doses of calcium at one time as it may cause constipation, and you can only absorb 500 mg  at a time  °o Do not mix multivitamins containing iron with calcium supplements; take 2 hours apart °• Menstruating women and those with a history of anemia (a blood disease that causes weakness) may need extra iron °o Talk with your doctor to see if you need more iron °• Do not stop taking or change any vitamins or minerals until you talk to your dietitian or surgeon °• Your Dietitian and/or surgeon must approve all vitamin and mineral supplements °  °Activity and Exercise: Limit your physical activity as instructed by your doctor.  It is important to continue walking at home.  During this time, use these guidelines: °• Do not lift anything greater than ten (10) pounds for at least two (2) weeks °• Do not go back to work or drive until your surgeon says you can °• You may have sex when you feel comfortable  °o It is  VERY important for female patients to use a reliable birth control method; fertility often increases after surgery  °o All hormonal birth control will be ineffective for 30 days after surgery due to medications given during surgery a barrier method must be used. °o Do not get pregnant for at least 18 months °• Start exercising as soon as your doctor tells you that you can °o Make sure your doctor approves any physical activity °• Start with a simple walking program °• Walk 5-15 minutes each day, 7 days per week.  °• Slowly increase until you are walking 30-45 minutes per day °Consider joining our BELT program. [phone] or email belt@[employer] °  °Special Instructions Things to remember: °• Use your CPAP when sleeping if this applies to you ° °• [facility] has two free Bariatric Surgery Support Groups that meet monthly °o The 3rd Thursday of each month, 6 pm, [facility]  °o The 2nd Friday of each month, 11:45 am in the private dining room in the basement of [facility] °• It is very important to keep all follow up appointments with your surgeon, dietitian, primary care physician, and behavioral health practitioner °• Routine follow up schedule with your surgeon include appointments at 2-3 weeks, 6-8 weeks, 6 months, and 1 year at a minimum.  Your surgeon may request to see you more often.   °o After the first year, please follow up with your bariatric surgeon and dietitian at least once a year in order to maintain best weight loss results °[facility]: [phone] °[facility]: [phone] °Bariatric Nurse Coordinator: [phone] °  °   Reviewed and Endorsed  °by [facility] Patient Education Committee, June, 2016 °Edits Approved: Aug, 2018 ° ° ° °

## 2017-03-27 NOTE — Op Note (Signed)
PATIENT:   Christie Fox DOB:   [birth year] MRN:   [id number]  DATE OF PROCEDURE: 03/27/2017                   FACILITY:  [facility]  OPERATIVE REPORT  PREOPERATIVE DIAGNOSIS:  Morbid obesity.  POSTOPERATIVE DIAGNOSIS:  Morbid obesity (weight 251, BMI of 45.2).  PROCEDURE:  Laparoscopic Sleeve gastrectomy (intraoperative upper endoscopy by Dr. Excell Seltzer)  SURGEON:  Fenton Malling. Lucia Gaskins, MD  FIRST ASSISTANT:  B. Hoxworth, MD  ANESTHESIA:  General endotracheal.  Anesthesiologist: Albertha Ghee, MD CRNA: Victoriano Lain, CRNA; [place] Manus Rudd, CRNA  General  ESTIMATED BLOOD LOSS:  Minimal.  LOCAL ANESTHESIA:  20 cc of Exparel and 30 cc of 5/1% Marcaine  COMPLICATIONS:  None.  INDICATION FOR SURGERY:  Christie Fox is a 62 y.o. white female who sees [place], Anderson [place], [place] as her primary care doctor.  She has completed our preoperative bariatric program and now comes for a laparoscopic sleeve gastrectomy.  The indications, potential complications of surgery were explained to the patient.  Potential complications of the surgery include, but are not limited to, bleeding, infection, DVT, open surgery, and long-term nutritional consequences.  OPERATIVE NOTE:  The patient taken to room #1 at [facility] where [name] underwent a general endotracheal anesthetic, supervised by Anesthesiologist: Albertha Ghee, MD CRNA: Victoriano Lain, CRNA; [place] Stephanie C, CRNA.  The patient was given 2 g of cefotetan at the beginning of the procedure.  A time-out was held and surgical checklist run.  I accessed her abdominal cavity through the left upper quadrant with a 5 mm Optiview. I did an abdominal exploration.   Her omentum and bowel were unremarkable. The right and left lobes of the liver unremarkable. Gallbladder was normal. Her stomach was unremarkable.  On entry, I came close to a small bowel injury, did not have an obvious injury.  I ran the small bowel twice and found no obvious  injury.  I placed a total of 6 trocars. I placed a 5 mm left lateral trocar, a 5 mm left paramedian trocar (for the scope), a 12 mm right paramedian torcar, a 5 mm right subcostal trocar that I converted to a 15 mm to extract the stomach and 5 mm subxiphoid trocar for the liver retractor.  I placed a abdominal wall anesthetic block using a mixture of 1/4% Marcaine and Exparel.  I used 20 cc per side, for a total of 40 cc.  I started out taking down the greater curvature attachments of the stomach. I measured approximately 6 cm proximal from the pylorus and mobilized the greater curvature of the stomach with the Harmonic Scalpel. I took this dissection cranially around the greater curvature of her stomach to the angle of His and the left crus.   After I had mobilized the greater curvature of the stomach, I then passed the 36 French ViSiGi bougie which was used to suck the stomach up against the lesser curvature and placed into the antrum. During the staple firing,  I tried to give the Wilber a cuff at least about 1 cm. I tried to avoid narrowing the incisura. I used a total of 7 staple firings.  From antrum to the angle of His, I used 2 green, 2 gold and 3 blue Eschelon 60 mm Ethicon staplers. I did not use staple line reinforcement.   At each firing of the EndoGIA stapler, I inspected the stomach, anterior wall of the stomach, and underneath to make  sure there was no compromise or impingement on to the ViSiGi bougie.   The staple line seemed linear without any corkscrewing of the stomach. Hemostasis was good. I did not use any reinforcement. She did have at least 1 areas of bleeding which I used clips to clip on the new greater curvature of the stomach.  Because I thought we had a good staple line, I then had the ViSiGi was converted to insufflate the pouch. The new stomach pouch was placed under water. There was no bubbling or leak noted.   At this point, Dr. Excell Seltzer broke scrub and passed an upper  endoscope down through the esophagus into the stomach pouch. The stomach was tubular. There was no narrowing of the stomach pouch or angulation. We were easily able to pass the endoscope into the antrum and again put air pressure on the staple line. I irrigated the upper abdomen with saline. There was no bubbling or evidence of air leak. The mucosa looked viable. Dr. Excell Seltzer decompressed the stomach with the endoscopy.   I converted to right subcostal trocar to a 15 trocar and extracted the stomach remnant through this intact and sent this to Pathology. I then placed 10 cc of Tisseel along the new greater curvature staple line and covered the entire staple line with the Tisseel.  I aspirated out the saline that I had irrigated because I thought the staple line looked viable and complete. There was no evidence of leak. I did not leave a drain in place.   The 15 mm trocar site did not require a suture. I closed the skin at each site with a 4-0 Monocryl, painted each wound with LiquiBand.   The patient was transported to recovery room in good condition. Sponge and needle count were correct at the end of the case.    Alphonsa Overall, MD, [facility] Pager: [phone] Office phone:  [phone]

## 2017-03-27 NOTE — Anesthesia Postprocedure Evaluation (Signed)
Anesthesia Post Note  Patient: Christie Fox  Procedure(s) Performed: LAPAROSCOPIC GASTRIC SLEEVE RESECTION WITH UPPER ENDO (N/A )     Patient location during evaluation: PACU Anesthesia Type: General Level of consciousness: awake and alert Pain management: pain level controlled Vital Signs Assessment: post-procedure vital signs reviewed and stable Respiratory status: spontaneous breathing, nonlabored ventilation, respiratory function stable and patient connected to nasal cannula oxygen Cardiovascular status: blood pressure returned to baseline and stable Postop Assessment: no apparent nausea or vomiting Anesthetic complications: no    Last Vitals:  Vitals:   03/27/17 1000 03/27/17 1015  BP: (!) 141/75 (!) 149/99  Pulse: 88 90  Resp: 14 10  Temp:  36.5 C  SpO2: 100% 96%    Last Pain:  Vitals:   03/27/17 1020  TempSrc:   PainSc: 7                  Jennifier Smitherman S

## 2017-03-27 NOTE — Interval H&P Note (Signed)
History and Physical Interval Note:  03/27/2017 7:06 AM  Christie Fox  has presented today for surgery, with the diagnosis of MORBID OBESITY  The various methods of treatment have been discussed with the patient and family.   Husband at bedside.  After consideration of risks, benefits and other options for treatment, the patient has consented to  Procedure(s): LAPAROSCOPIC GASTRIC SLEEVE RESECTION WITH UPPER ENDO (N/A) as a surgical intervention .  The patient's history has been reviewed, patient examined, no change in status, stable for surgery.  I have reviewed the patient's chart and labs.  Questions were answered to the patient's satisfaction.     Shann Medal

## 2017-03-27 NOTE — Transfer of Care (Signed)
Immediate Anesthesia Transfer of Care Note  Patient: Christie Fox  Procedure(s) Performed: LAPAROSCOPIC GASTRIC SLEEVE RESECTION WITH UPPER ENDO (N/A )  Patient Location: PACU  Anesthesia Type:General  Level of Consciousness: awake, alert , oriented and patient cooperative  Airway & Oxygen Therapy: Patient Spontanous Breathing and Patient connected to face mask oxygen  Post-op Assessment: Report given to RN, Post -op Vital signs reviewed and stable and Patient moving all extremities  Post vital signs: Reviewed and stable  Last Vitals:  Vitals:   03/27/17 0552 03/27/17 0939  BP: 135/84 (!) (P) 151/104  Pulse: 98 91  Resp: 16 (!) 9  Temp: 36.9 C (P) 36.5 C  SpO2: 96% 100%    Last Pain:  Vitals:   03/27/17 0552  TempSrc: Oral      Patients Stated Pain Goal: 4 (16/10/96 0454)  Complications: No apparent anesthesia complications

## 2017-03-27 NOTE — Op Note (Signed)
Procedure: Upper GI endoscopy  Description of procedure: Upper GI endoscopy is performed at the completion of laparoscopic sleeve gastrectomy by Dr.  Lucia Gaskins.  The video endoscope was introduced into the upper esophagus and then passed to the EG junction at about 40 cm. The esophagus appeared normal. The gastric sleeve was entered. The sleeve was tensely distended with air while the outlet was obstructed under saline irrigation by the operating surgeon. There was no evidence of leak. The staple line was intact and without bleeding. The scope was advanced to the antrum and pylorus visualized. There was no stricture or twisting or mucosal abnormality, and particularly no narrowing noted at the incisura.  The pouch was then desufflated and the scope withdrawn.  Edward Jolly MD, FACS  03/27/2017, 9:06 AM

## 2017-03-27 NOTE — Progress Notes (Signed)
Discussed post op day goals with patient including ambulation, IS, diet progression, pain, and nausea control.  Questions answered. 

## 2017-03-27 NOTE — Anesthesia Preprocedure Evaluation (Signed)
Anesthesia Evaluation  Patient identified by MRN, date of birth, ID band Patient awake    Reviewed: Allergy & Precautions, H&P , NPO status , Patient's Chart, lab work & pertinent test results  History of Anesthesia Complications (+) PONV and history of anesthetic complications  Airway Mallampati: II   Neck ROM: full    Dental   Pulmonary neg pulmonary ROS,    breath sounds clear to auscultation       Cardiovascular hypertension,  Rhythm:regular Rate:Normal     Neuro/Psych  Headaches, Anxiety    GI/Hepatic   Endo/Other  Hypothyroidism Morbid obesity  Renal/GU      Musculoskeletal  (+) Arthritis ,   Abdominal   Peds  Hematology   Anesthesia Other Findings   Reproductive/Obstetrics                             Anesthesia Physical Anesthesia Plan  ASA: II  Anesthesia Plan: General   Post-op Pain Management:    Induction: Intravenous  PONV Risk Score and Plan: 4 or greater and Ondansetron, Dexamethasone, Midazolam, Scopolamine patch - Pre-op and Treatment may vary due to age or medical condition  Airway Management Planned: Oral ETT  Additional Equipment:   Intra-op Plan:   Post-operative Plan: Extubation in OR  Informed Consent: I have reviewed the patients History and Physical, chart, labs and discussed the procedure including the risks, benefits and alternatives for the proposed anesthesia with the patient or authorized representative who has indicated his/her understanding and acceptance.     Plan Discussed with: CRNA, Anesthesiologist and Surgeon  Anesthesia Plan Comments:         Anesthesia Quick Evaluation

## 2017-03-27 NOTE — Anesthesia Procedure Notes (Signed)
Procedure Name: Intubation Date/Time: 03/27/2017 7:33 AM Performed by: Victoriano Lain, CRNA Pre-anesthesia Checklist: Patient identified, Emergency Drugs available, Suction available, Patient being monitored and Timeout performed Patient Re-evaluated:Patient Re-evaluated prior to induction Oxygen Delivery Method: Circle system utilized Preoxygenation: Pre-oxygenation with 100% oxygen Induction Type: IV induction Ventilation: Mask ventilation without difficulty Laryngoscope Size: Mac and 4 Grade View: Grade I Tube type: Oral Tube size: 7.5 mm Airway Equipment and Method: Stylet Placement Confirmation: ETT inserted through vocal cords under direct vision,  positive ETCO2 and breath sounds checked- equal and bilateral Secured at: 22 cm Tube secured with: Tape Dental Injury: Teeth and Oropharynx as per pre-operative assessment

## 2017-03-28 LAB — CBC WITH DIFFERENTIAL/PLATELET
Basophils Absolute: 0 10*3/uL (ref 0.0–0.1)
Basophils Relative: 0 %
Eosinophils Absolute: 0 10*3/uL (ref 0.0–0.7)
Eosinophils Relative: 0 %
HEMATOCRIT: 39.6 % (ref 36.0–46.0)
HEMOGLOBIN: 13.2 g/dL (ref 12.0–15.0)
LYMPHS ABS: 0.3 10*3/uL — AB (ref 0.7–4.0)
Lymphocytes Relative: 3 %
MCH: 32.1 pg (ref 26.0–34.0)
MCHC: 33.3 g/dL (ref 30.0–36.0)
MCV: 96.4 fL (ref 78.0–100.0)
MONOS PCT: 7 %
Monocytes Absolute: 0.7 10*3/uL (ref 0.1–1.0)
NEUTROS ABS: 8.9 10*3/uL — AB (ref 1.7–7.7)
Neutrophils Relative %: 90 %
Platelets: 301 10*3/uL (ref 150–400)
RBC: 4.11 MIL/uL (ref 3.87–5.11)
RDW: 12.2 % (ref 11.5–15.5)
WBC: 10 10*3/uL (ref 4.0–10.5)

## 2017-03-28 NOTE — Plan of Care (Signed)

## 2017-03-28 NOTE — Progress Notes (Signed)
Patient alert and oriented, Post op day 1.  Provided support and encouragement.  Encouraged pulmonary toilet, ambulation and small sips of liquids.  Patient completed 12 ounces of bari clear fluid advanced to protein shake.  Completed 11 ounces of premiere protein shake.  All questions answered.  Will continue to monitor.

## 2017-03-28 NOTE — Discharge Summary (Signed)
Physician Discharge Summary  Patient ID:  Christie Fox  MRN: [id number]  DOB/AGE: 1955-02-24 62 y.o.  Admit date: 03/27/2017 Discharge date: 03/28/2017  Discharge Diagnoses:  1.  Morbid obesity  Weight - 251, BMI - 45.2 2.  HYPERTENSION, BENIGN (I10)             X 10 years. 3. HIstory of left kidney stone Dr. Clint Lipps 4. Arthritis involving both knees 5. Back surgery x 2 - [facility] Last surgery around 2013 6. Left ankle surgery after fall in 2002 - [facility] 7. 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 [facility]  Discharged Condition: good  Hospital Course: Christie Fox is an 62 y.o. female whose primary care physician is Couillard, Anderson [place], [place] and who was admitted 03/27/2017 with a chief complaint of morbid obesity.   She was brought to the operating room on 03/27/2017 and underwent  LAPAROSCOPIC GASTRIC SLEEVE RESECTION WITH UPPER ENDO.  She is now one day post op.  She is tolerating liquids and ready to go home. Her husband is at the bedside.   The discharge instructions were reviewed with the patient.  Consults: None  Significant Diagnostic Studies: Results for orders placed or performed during the hospital encounter of 03/27/17  Pregnancy, urine STAT morning of surgery  Result Value Ref Range   Preg Test, Ur NEGATIVE NEGATIVE  Hemoglobin and hematocrit, blood  Result Value Ref Range   Hemoglobin 14.7 12.0 - 15.0 g/dL   HCT 43.9 36.0 - 46.0 %  CBC WITH DIFFERENTIAL  Result Value Ref Range   WBC 10.0 4.0 - 10.5 K/uL   RBC 4.11 3.87 - 5.11 MIL/uL   Hemoglobin 13.2 12.0 - 15.0 g/dL   HCT 39.6 36.0 - 46.0 %   MCV 96.4 78.0 - 100.0 fL   MCH 32.1 26.0 - 34.0 pg   MCHC 33.3 30.0 - 36.0 g/dL   RDW 12.2 11.5 - 15.5 %   Platelets 301 150 - 400 K/uL   Neutrophils Relative % 90 %   Neutro Abs 8.9 (H) 1.7 - 7.7 K/uL   Lymphocytes Relative 3 %   Lymphs Abs 0.3 (L) 0.7 - 4.0 K/uL   Monocytes Relative 7 %   Monocytes Absolute 0.7 0.1 - 1.0 K/uL   Eosinophils Relative 0 %   Eosinophils Absolute 0.0 0.0 - 0.7 K/uL   Basophils Relative 0 %   Basophils Absolute 0.0 0.0 - 0.1 K/uL    No results found.  Discharge Exam:  Vitals:   03/28/17 0136 03/28/17 0531  BP: 118/70 117/71  Pulse: 82 83  Resp: 17 17  Temp: 98.2 F (36.8 C) 98.1 F (36.7 C)  SpO2: 96% 98%    General: Obese WF who is alert and generally healthy appearing.  She has false eyelashes. Lungs: Clear to auscultation and symmetric breath sounds. Heart:  RRR. No murmur or rub. Abdomen: Soft. No mass. No hernia. Normal bowel sounds. Her incisions look good.  Discharge Medications:   Allergies as of 03/28/2017      Reactions   Imitrex [sumatriptan] Shortness Of Breath, Swelling   Butorphanol Other (See Comments)   Stadol--Ineffective on migraine headaches.   Chlorpromazine Other (See Comments)   Unknown reaction type   Etodolac Other (See Comments)   Unknown reaction type   Oxycodone Nausea And Vomiting  Medication List    TAKE these medications   ALPRAZolam 1 MG tablet Commonly known as:  XANAX Take 1 mg by mouth 3 (three) times daily as needed for anxiety.   fenofibrate micronized 134 MG capsule Commonly known as:  LOFIBRA Take 134 mg by mouth daily.   furosemide 40 MG tablet Commonly known as:  LASIX Take 40 mg by mouth daily. Notes to patient:  Monitor Blood Pressure Daily and keep a log for primary care physician.  Monitor for symptoms of dehydration.  You may need to make changes to your medications with rapid weight loss.     hydroxypropyl methylcellulose / hypromellose 2.5 % ophthalmic solution Commonly known as:  ISOPTO TEARS / GONIOVISC Place 1 drop into both eyes 3 (three) times daily as needed for dry eyes. INSTANT TEARS   levothyroxine 100 MCG tablet Commonly known  as:  SYNTHROID, LEVOTHROID Take 100 mcg by mouth daily before breakfast.   LUMIFY 0.025 % Soln Generic drug:  Brimonidine Tartrate Place 1 drop into both eyes daily as needed (for irritated/dry eyes.). LUMIFY   perindopril 4 MG tablet Commonly known as:  ACEON Take 4 mg by mouth daily.   rosuvastatin 10 MG tablet Commonly known as:  CRESTOR Take 10 mg by mouth daily at 6 PM. (1700)       Disposition: 01-Home or Self Care  Discharge Instructions    Ambulate hourly while awake   Complete by:  As directed    Call MD for:  difficulty breathing, headache or visual disturbances   Complete by:  As directed    Call MD for:  persistant dizziness or light-headedness   Complete by:  As directed    Call MD for:  persistant nausea and vomiting   Complete by:  As directed    Call MD for:  redness, tenderness, or signs of infection (pain, swelling, redness, odor or green/yellow discharge around incision site)   Complete by:  As directed    Call MD for:  severe uncontrolled pain   Complete by:  As directed    Call MD for:  temperature >101 F   Complete by:  As directed    Diet bariatric full liquid   Complete by:  As directed    Incentive spirometry   Complete by:  As directed    Perform hourly while awake      Follow-up Information    Alphonsa Overall, MD. Go on 04/19/2017.   Specialty:  General Surgery Why:  at [address] information: [address] [address] [phone]        Alphonsa Overall, MD Follow up.   Specialty:  General Surgery Contact information: [address] [place] [address] [phone]            Signed: Alphonsa Overall, M.D., [facility] Surgery Office:  [phone]  03/28/2017, 7:59 AM

## 2017-03-28 NOTE — Progress Notes (Signed)
Discharge instructions discussed with patient and family, verbalized agreement and understanding 

## 2017-03-28 NOTE — Progress Notes (Signed)
Patient alert and oriented, pain is controlled. Patient is tolerating fluids, advanced to protein shake today, patient is tolerating well.  Reviewed Gastric sleeve discharge instructions with patient and patient is able to articulate understanding.  Provided information on BELT program, Support Group and [facility] outpatient pharmacy. All questions answered, will continue to monitor.  

## 2017-04-02 ENCOUNTER — Telehealth (HOSPITAL_COMMUNITY): Payer: Self-pay

## 2017-04-02 NOTE — Telephone Encounter (Signed)
Patient called to discuss post bariatric surgery follow up questions.  See below:   1.  Tell me about your pain and pain management?has taken pain medication 3/4 times since discharge.  Medication has helped reduce pain.  Instructed to call if pain medication is ineffective with pain  2.  Let's talk about fluid intake.  How much total fluid are you taking in?42+ ounces of fluid including 30 ounces of water, 12 ounces of protein shake, and SF popsicle.  Discussed fluid goal of 64 ounces  3.  How much protein have you taken in the last 2 days?30 grams of protein or one shake.  We discussed increasing protein and strategies with target goal of 60 grams of protein.  We also discussed importance of protein   4.  Have you had nausea?  Tell me about when have experienced nausea and what you did to help?reports no nausea, medication not needed  5.  Has the frequency or color changed with your urine?frequent urination with light colored urine  6.  Tell me what your incisions look like?look good, glue intact no redness, warmth, or open incisions  7.  Have you been passing gas? BM?had one BM after using Miralax.  Counseled to continue to use miralax if no bm or feelings of constipation as this is a common complaint after surgery  8.  If a problem or question were to arise who would you call?  Do you know contact numbers for [facility], [facility], and [facility]?patient aware of how to access all services above  9.  How has the walking going?walking frequently at home  10.  How are your vitamins and calcium going?  How are you taking them?Patient is able to verbalize vitamin and calcium schedule.  Also recognizes that Thyroid medication and MVI/Ca++ can not be taken together.  Patient did report having a cold with sore throat and cough.  She has not had a temperature and continues to use IS at discharge.  She is aware to call with fevers greater than 100.4 as directed by DC instructions.  Using pillow to splint abdomen

## 2017-04-10 ENCOUNTER — Encounter: Payer: BLUE CROSS/BLUE SHIELD | Attending: Surgery | Admitting: Skilled Nursing Facility1

## 2017-04-10 DIAGNOSIS — Z713 Dietary counseling and surveillance: Secondary | ICD-10-CM | POA: Diagnosis not present

## 2017-04-10 DIAGNOSIS — Z6841 Body Mass Index (BMI) 40.0 and over, adult: Secondary | ICD-10-CM

## 2017-04-11 ENCOUNTER — Telehealth: Payer: Self-pay | Admitting: Skilled Nursing Facility1

## 2017-04-11 NOTE — Telephone Encounter (Signed)
Christie Fox stated she is having pain in her upper abdomen so dietitian advised she try yogurt tonight and call in the morning to see how it went.  Last night: 4 oz of oikos yogurt taking 30 minutes. It was thick. Wanted to stick right in the middle of her chest tried again this morning 2 T and hurt chest so switched to protein shake.  Stay on liquid, Dietitian will call Tuesday to check on progress.

## 2017-04-12 ENCOUNTER — Encounter: Payer: Self-pay | Admitting: Skilled Nursing Facility1

## 2017-04-12 NOTE — Progress Notes (Signed)
Bariatric Class:  Appt start time: 1530 end time:  1630.  2 Week Post-Operative Nutrition Class  Patient was seen on 04/12/2017 for Post-Operative Nutrition [facility].   Pt states she has upper abdominal pain after swallowing stating she is drinking 2-4 ounces at a time.   Surgery date: 03/27/2017 Surgery type: sleeve Start weight at [facility]: 249.3 Weight today: 233.4  TANITA  BODY COMP RESULTS  04/12/2017   BMI (kg/m^2) 42.7   Fat Mass (lbs) 122.8   Fat Free Mass (lbs) 110.6   Total Body Water (lbs) 80.2   The following the learning objectives were met by the patient during this course:  Identifies Phase 3A (Soft, High Proteins) Dietary Goals and will begin from 2 weeks post-operatively to 2 months post-operatively  Identifies appropriate sources of fluids and proteins   States protein recommendations and appropriate sources post-operatively  Identifies the need for appropriate texture modifications, mastication, and bite sizes when consuming solids  Identifies appropriate multivitamin and calcium sources post-operatively  Describes the need for physical activity post-operatively and will follow MD recommendations  States when to call healthcare provider regarding medication questions or post-operative complications  Handouts given during class include:  Phase 3A: Soft, High Protein Diet Handout  Follow-Up Plan: Patient will follow-up at [facility] in 6 weeks for 2 month post-op nutrition visit for diet advancement per MD.

## 2017-04-18 ENCOUNTER — Telehealth: Payer: Self-pay | Admitting: Skilled Nursing Facility1

## 2017-04-18 NOTE — Telephone Encounter (Signed)
Going Better. Eaten yogurt. Have [place] patties but stirfried like hamburger and ate 1/3 of a patty and over easy egg with it. No nausea and no knot. Eating every 3 hours with yogurt in between. Getting over 64 fluid ounces of fluid and 60 grams of protein.

## 2017-05-23 ENCOUNTER — Ambulatory Visit: Payer: BLUE CROSS/BLUE SHIELD | Admitting: Skilled Nursing Facility1

## 2017-05-25 ENCOUNTER — Encounter: Payer: Self-pay | Admitting: Registered"

## 2017-05-25 ENCOUNTER — Encounter: Payer: BLUE CROSS/BLUE SHIELD | Attending: Surgery | Admitting: Registered"

## 2017-05-25 DIAGNOSIS — Z713 Dietary counseling and surveillance: Secondary | ICD-10-CM | POA: Diagnosis not present

## 2017-05-25 DIAGNOSIS — E669 Obesity, unspecified: Secondary | ICD-10-CM

## 2017-05-25 NOTE — Progress Notes (Signed)
Follow-up visit:  8 Weeks Post-Operative Sleeve Gastrectomy Surgery  Medical Nutrition Therapy:  Appt start time: 8:38 end time:  9:15.  Primary concerns today: Post-operative Bariatric Surgery Nutrition Management.  Non scale victories: joined gym, more social, spend more time with grandchildren, less knee and ankle pain, improved balance  Surgery date: 03/27/2017 Surgery type: sleeve Start weight at [facility]: 249.3 Weight today: 230.2 Weight change: 3.2 lbs from 233.4 (04/12/2017) Total weight lost: 19.1 lbs Weight loss goal: to be more active, improve knee pain,    TANITA  BODY COMP RESULTS  04/12/2017 05/25/2017   BMI (kg/m^2) 42.7 42.1   Fat Mass (lbs) 122.8 113.8   Fat Free Mass (lbs) 110.6 116.4   Total Body Water (lbs) 80.2 84.2   Pt is talkative and weight-focused. Pt states she is still working on her anxiety. Pt states she does not know what to eat. Pt states she eats 1-1.5 ounces of food every 3 hours. Pt states she mostly eats fish, chicken, and [place]. Pt states she was paranoid in the beginning  but no longer bothered by what she thinks people are thinking while she's eating; if she's taking too long or not. Pt states when she gets hungry she will break down and eat a sugar-free popsicle or broth. Pt states she tracks protein, fluid intake, and physical activity daily. Pt states she has bowel movements daily. Pt states she tried tomato-based sauce with cauliflower noodles about 3 weeks ago and it did not go well for her. Pt states she may not try it again. Pt states she tried a small amount of diet sprite and it did not go well.   Preferred Learning Style:   No preference indicated   Learning Readiness:   Ready  Change in progress  24-hr recall: B (AM): scrambled egg (6g), 1/2 protein shake (30g) or Atkins (8.5g) Snk (10 AM): scrambled egg (6g)  L (1 PM): 1.5 oz [place] patty (10.5g) Snk (2:30 PM): 1.5 oz [place] patty (10.5g)  Snk (PM): 4-5 ounces broth to get a full  feeling  D (4 PM): 3 oz fish (21 g) Snk (PM): sometimes sugar-free popsicle or broth   Fluid intake: water; 64-70 ounces Estimated total protein intake: 60+ grams  Medications: See list Supplementation:  2 bariatric multi + 3 calcium  Using straws: no Drinking while eating: no Having you been chewing well: yes Chewing/swallowing difficulties: no Changes in vision: no Changes to mood/headaches: no, improved migraines Hair loss/Changes to skin/Changes to nails: no, no, no; everything has improved Any difficulty focusing or concentrating: no Sweating: no longer having night sweats Dizziness/Lightheaded: no Palpitations: no  Carbonated beverages: no N/V/D/C/GAS: no, no, no, sometimes-takes Miralax every 2-3 days, no Abdominal Pain: no Dumping syndrome: no Last Lap-Band fill: N/A  Recent physical activity:  Gym 15 min treadmill, 30 min bike, resistance bands, stairsat least 3x/week  Progress Towards Goal(s):  In progress.  Handouts given during visit include:  Phase IV: High Protein + NS vegetables   Nutritional Diagnosis:  [place]-3.3 Overweight/obesity related to past poor dietary habits and physical inactivity as evidenced by patient w/ recent sleeve gastrectomy surgery following dietary guidelines for continued weight loss.    Intervention:  Nutrition education and counseling. Pt was educated and counseled on the next phase of the diet.  Goals:  Follow Phase 3B: High Protein + Non-Starchy Vegetables  Eat 3-6 small meals/snacks, every 3-5 hrs  Increase lean protein foods to meet 60g goal  Increase fluid intake to 64oz +  Avoid drinking 15 minutes before, during and 30 minutes after eating  Aim for >30 min of physical activity daily  Teaching Method Utilized:  Visual Auditory Hands on  Barriers to learning/adherence to lifestyle change: none identified  Demonstrated degree of understanding via:  Teach Back   Monitoring/Evaluation:  Dietary intake, exercise, lap  band fills, and body weight. Follow up in 4 months for 6 month post-op visit.

## 2017-05-25 NOTE — Patient Instructions (Signed)
Goals:  Follow Phase 3B: High Protein + Non-Starchy Vegetables  Eat 3-6 small meals/snacks, every 3-5 hrs  Increase lean protein foods to meet 60g goal  Increase fluid intake to 64oz +  Avoid drinking 15 minutes before, during and 30 minutes after eating  Aim for >30 min of physical activity daily  

## 2017-08-29 ENCOUNTER — Telehealth: Payer: Self-pay | Admitting: Skilled Nursing Facility1

## 2017-08-29 NOTE — Telephone Encounter (Signed)
Pt states she has been taken off of her cholesterol medication and is starting to take the pressure off focusing on weight and seeing all of the wonderful things her health has.   Pt state she was panicking over her weight but now is focusing on her health.

## 2017-08-29 NOTE — Telephone Encounter (Signed)
Hey Christie Fox mrn [phone] wants to know if you can write her a letter for [employer],  she has been called up for 8/20 and she has PCP writing one. Dr Lucia Gaskins is out on [place] until 8/08, she needs letters in courts by 8/10. she said that she can't sit there all day when she eats 6 small meals a day and drinks 70+ oz of fluids and has to go to bathroom numerous times a day  I told her you'd call her at [phone],  that I don't think we do that. She said that since this is a requirement that she needs you to write it also.   Dietitian advised she could not.  Pt states she will get one from her PCP.

## 2017-09-25 ENCOUNTER — Ambulatory Visit: Payer: BLUE CROSS/BLUE SHIELD | Admitting: Skilled Nursing Facility1

## 2017-10-16 ENCOUNTER — Encounter: Payer: BLUE CROSS/BLUE SHIELD | Attending: Surgery | Admitting: Skilled Nursing Facility1

## 2017-10-16 ENCOUNTER — Encounter: Payer: Self-pay | Admitting: Skilled Nursing Facility1

## 2017-10-16 DIAGNOSIS — Z6841 Body Mass Index (BMI) 40.0 and over, adult: Secondary | ICD-10-CM

## 2017-10-16 DIAGNOSIS — Z713 Dietary counseling and surveillance: Secondary | ICD-10-CM | POA: Diagnosis not present

## 2017-10-16 NOTE — Progress Notes (Signed)
Post-Operative Sleeve Gastrectomy Surgery  Medical Nutrition Therapy:  Appt start time: 8:38 end time:  9:15.  Primary concerns today: Post-operative Bariatric Surgery Nutrition Management.  Non scale victories: joined gym, more social, spend more time with grandchildren, less knee and ankle pain, improved balance  Pt is talkative and weight-focused: but trying not to be.  Pt states this past month she has been off her schedule. Pt spent the first half of the appt describing her granddaughter. Pt states her husband is going to retire in december. Pt states she really hates the gym but makes herself go. Pt states instead of stress eating she gest her emotions out in the moment. Pt state she is having a bowl movement every day. Pt wants to discuss fruit at her next visit.   Surgery date: 03/27/2017 Surgery type: sleeve Start weight at [facility]: 249.3 Weight today: 226.2 Weight change: 4 (10/16/2017) Weight loss goal: to be more active, improve knee pain  TANITA  BODY COMP RESULTS  04/12/2017 05/25/2017 10/16/2017   BMI (kg/m^2) 42.7 42.1 41.4   Fat Mass (lbs) 122.8 113.8 118.4   Fat Free Mass (lbs) 110.6 116.4 107.8   Total Body Water (lbs) 80.2 84.2 77.8    29-UT recall: a slice of wheat toast once a week B (6 AM): scrambled egg (6g) and [place] sausage or protein shake Snk (10 AM): boiled egg (6g)  L (1 PM): [place] wrap or fish or 4 shrimp Snk (2:30 PM): popcicle Snk (PM): 4-5 ounces broth to get a full feeling  D (5:30 PM): 3 oz fish or chicken (21 g) and green beans or squash Snk (PM): sometimes sugar-free popsicle or broth   Fluid intake: water; 64-70 ounces Estimated total protein intake: 75-90 grams  Medications: See list Supplementation:  2 bariatric multi + 3 calcium  Using straws: no Drinking while eating: no Having you been chewing well: yes Chewing/swallowing difficulties: no Changes in vision: no Changes to mood/headaches: no, improved migraines Hair  loss/Changes to skin/Changes to nails: no, no, no; everything has improved Any difficulty focusing or concentrating: no Sweating: no longer having night sweats Dizziness/Lightheaded: no Palpitations: no  Carbonated beverages: no N/V/D/C/GAS: no, no, no, no Abdominal Pain: no Dumping syndrome: no  Recent physical activity:  Gym 15 min treadmill, 30 min bike, resistance bands, stairsat least 3x/week  Progress Towards Goal(s):  In progress.  Handouts given during visit include:  Phase IV: High Protein + NS vegetables   Nutritional Diagnosis:  -3.3 Overweight/obesity related to past poor dietary habits and physical inactivity as evidenced by patient w/ recent sleeve gastrectomy surgery following dietary guidelines for continued weight loss.    Intervention:  Nutrition education and counseling. Pt was educated and counseled on the next phase of the diet.  Goals: -have vegetables for a snack instead of popcicle  -be active for 6 days a week at least 30 minutes  -Switch to a Capsule multivitamin having eaten with it  Teaching Method Utilized:  Visual Auditory Hands on  Barriers to learning/adherence to lifestyle change: none identified  Demonstrated degree of understanding via:  Teach Back   Monitoring/Evaluation:  Dietary intake, exercise, lap band fills, and body weight. Follow up in 4 months for 6 month post-op visit.

## 2017-10-16 NOTE — Patient Instructions (Signed)
-  Have a vegetable for a snack instead of a popcicle  -

## 2017-12-18 ENCOUNTER — Encounter: Payer: Self-pay | Admitting: Skilled Nursing Facility1

## 2017-12-18 ENCOUNTER — Encounter: Payer: BLUE CROSS/BLUE SHIELD | Attending: Surgery | Admitting: Skilled Nursing Facility1

## 2017-12-18 DIAGNOSIS — Z6841 Body Mass Index (BMI) 40.0 and over, adult: Secondary | ICD-10-CM

## 2017-12-18 DIAGNOSIS — Z713 Dietary counseling and surveillance: Secondary | ICD-10-CM | POA: Diagnosis not present

## 2017-12-18 NOTE — Progress Notes (Signed)
Post-Operative Sleeve Gastrectomy Surgery  Medical Nutrition Therapy:  Appt start time: 8:38 end time:  9:15.  Primary concerns today: Post-operative Bariatric Surgery Nutrition Management.  Non scale victories: joined gym, more social, spend more time with grandchildren, less knee and ankle pain, improved balance  Pt is talkative and weight-focused: but trying not to be. Pt states she weighs every day.   Pt states she is going to change her insurance to medicare. Pt states lately she has had a metallic taste in her mouth. Pt states eggs make her nauseous. Pt admits to not having gone to the gym but states she plans on getting back to it. Pt states she is excited for her husband to retire but is afraid he will not take it well.  Pt states she will check with her new insurance and call [facility] for follow up.   Surgery date: 03/27/2017 Surgery type: sleeve Start weight at [facility]: 249.3 Weight today: 222.8 Weight change: 4  Weight loss goal: to be more active, improve knee pain  TANITA  BODY COMP RESULTS  04/12/2017 05/25/2017 10/16/2017 12/18/2017   BMI (kg/m^2) 42.7 42.1 41.4 40.8   Fat Mass (lbs) 122.8 113.8 118.4 116.6   Fat Free Mass (lbs) 110.6 116.4 107.8 106.2   Total Body Water (lbs) 80.2 84.2 77.8 76.8    24-hr recall: 3 meals a Day and 1 snack; whatever is for lunch is also for dinner B (6 AM): half protein shake at 5:30 and then [place] bacon later with fruit (2 strawberries) Snk (10 AM):  L (1 PM): tilapia and broccli  Snk (2:30 PM): 1 celery stalk D (5:30 PM):  3oz tilapia and 1 cup broccli  Snk (PM):   Fluid intake: water; 64-72 ounces Estimated total protein intake: 80 grams  Medications: See list Supplementation:  2 bariatric multi + 3 calcium  Using straws: no Drinking while eating: no Having you been chewing well: yes Chewing/swallowing difficulties: no Changes in vision: no Changes to mood/headaches: no, improved migraines Hair loss/Changes to skin/Changes  to nails: no, no, no; everything has improved Any difficulty focusing or concentrating: no Sweating: no longer having night sweats Dizziness/Lightheaded: no Palpitations: no  Carbonated beverages: no N/V/D/C/GAS: took ducolax one time  Abdominal Pain: no Dumping syndrome: no  Recent physical activity:  ADL's  Progress Towards Goal(s):  In progress.   Nutritional Diagnosis:  [place]-3.3 Overweight/obesity related to past poor dietary habits and physical inactivity as evidenced by patient w/ recent sleeve gastrectomy surgery following dietary guidelines for continued weight loss.    Intervention:  Nutrition education and counseling. Pt was educated and counseled on the next phase of the diet.  Goals: -Aim for 150 minutes of acvivity per week -Aim for 64 fluid ounces daily with half being plain water -Aim for 60 grams of protein daily -Aim for nonstarchy vegetables at least 2 times a day   Teaching Method Utilized:  Visual Auditory Hands on  Barriers to learning/adherence to lifestyle change: none identified  Demonstrated degree of understanding via:  Teach Back   Monitoring/Evaluation:  Dietary intake, exercise, lap band fills, and body weight.

## 2018-02-17 IMAGING — CR DG CHEST 2V
2 series · 2 of 2 positions shown · non-contrast
Comparison: 07/31/2013

CLINICAL DATA: Morbid obesity, preop

EXAM:
CHEST  2 VIEW

[w chest pa]
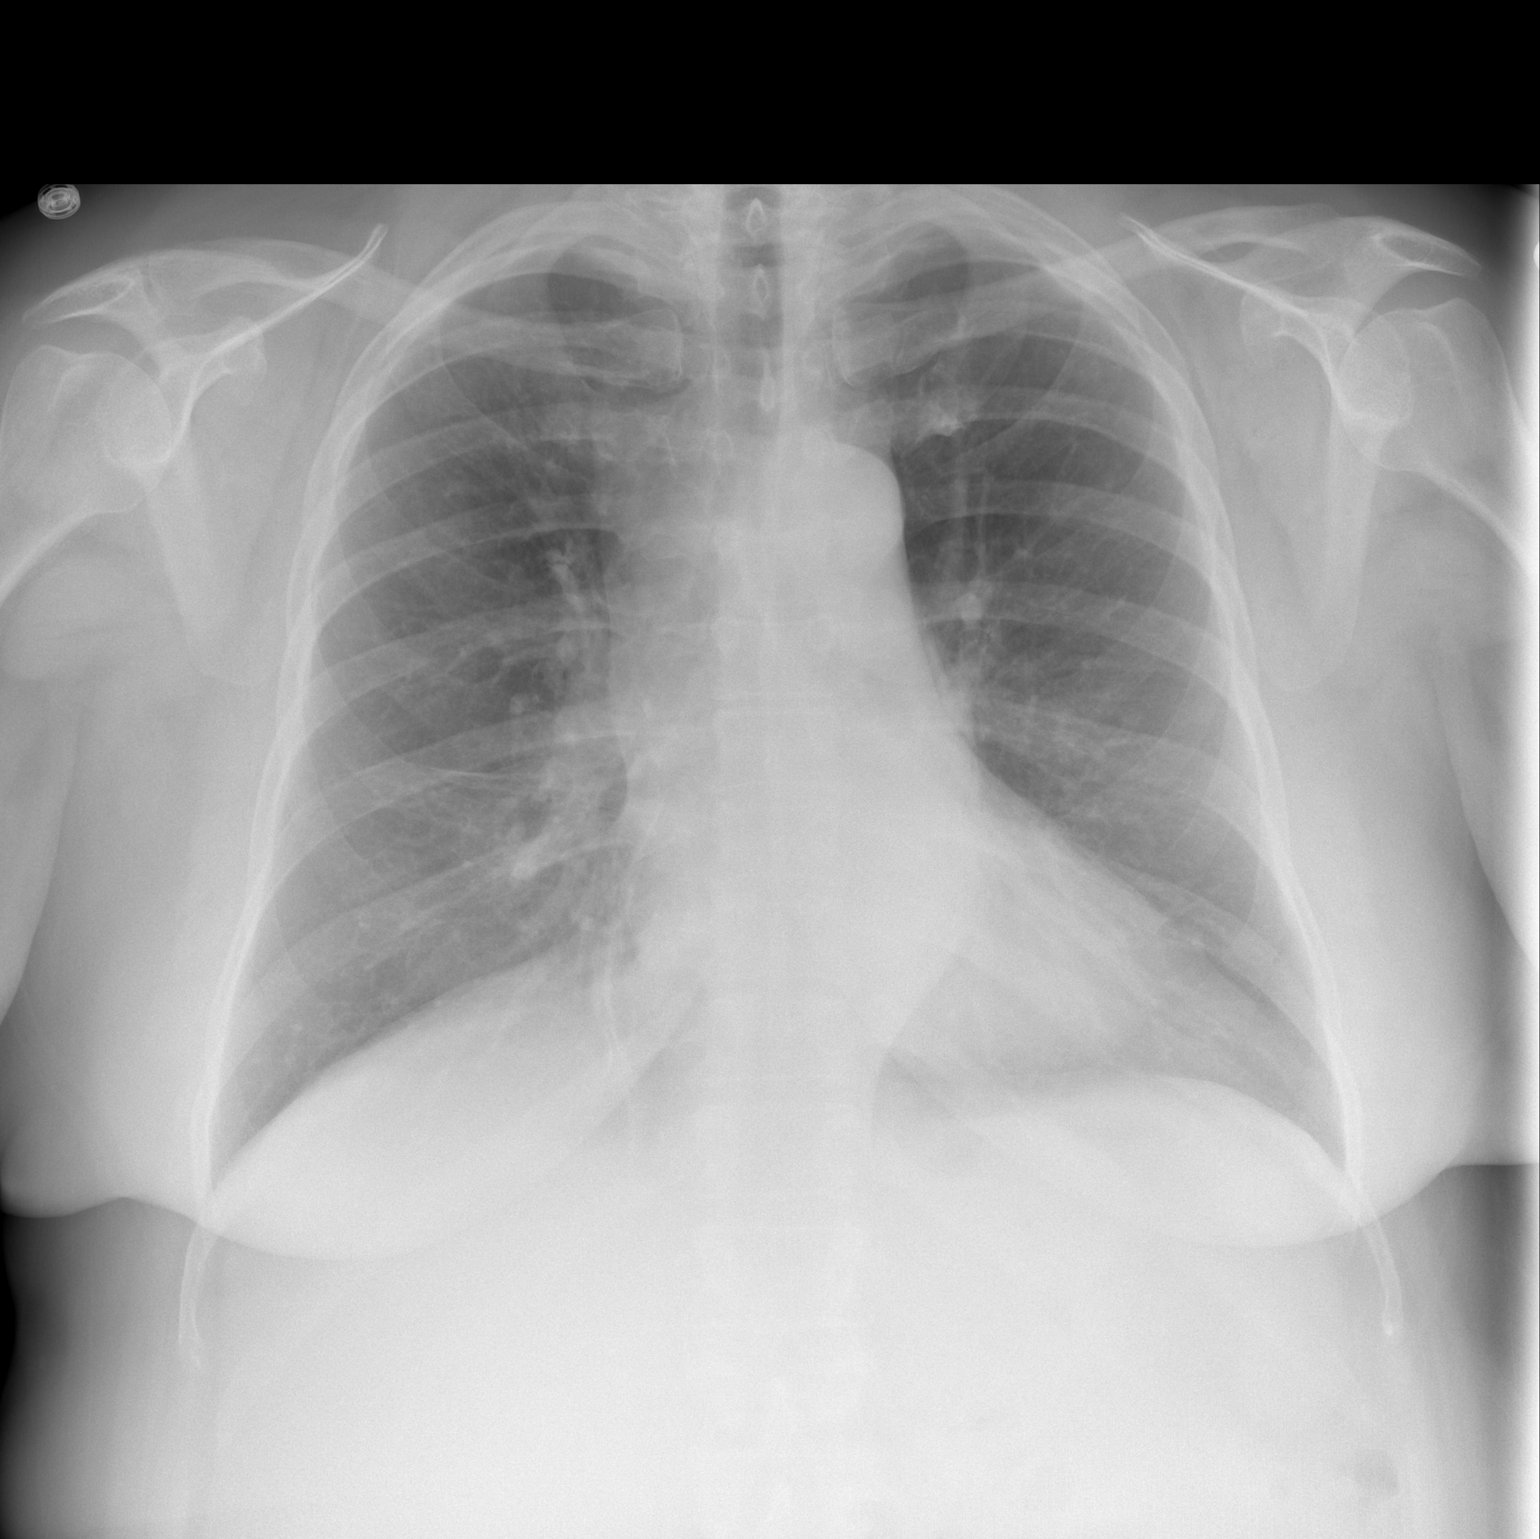

[w chest lat]
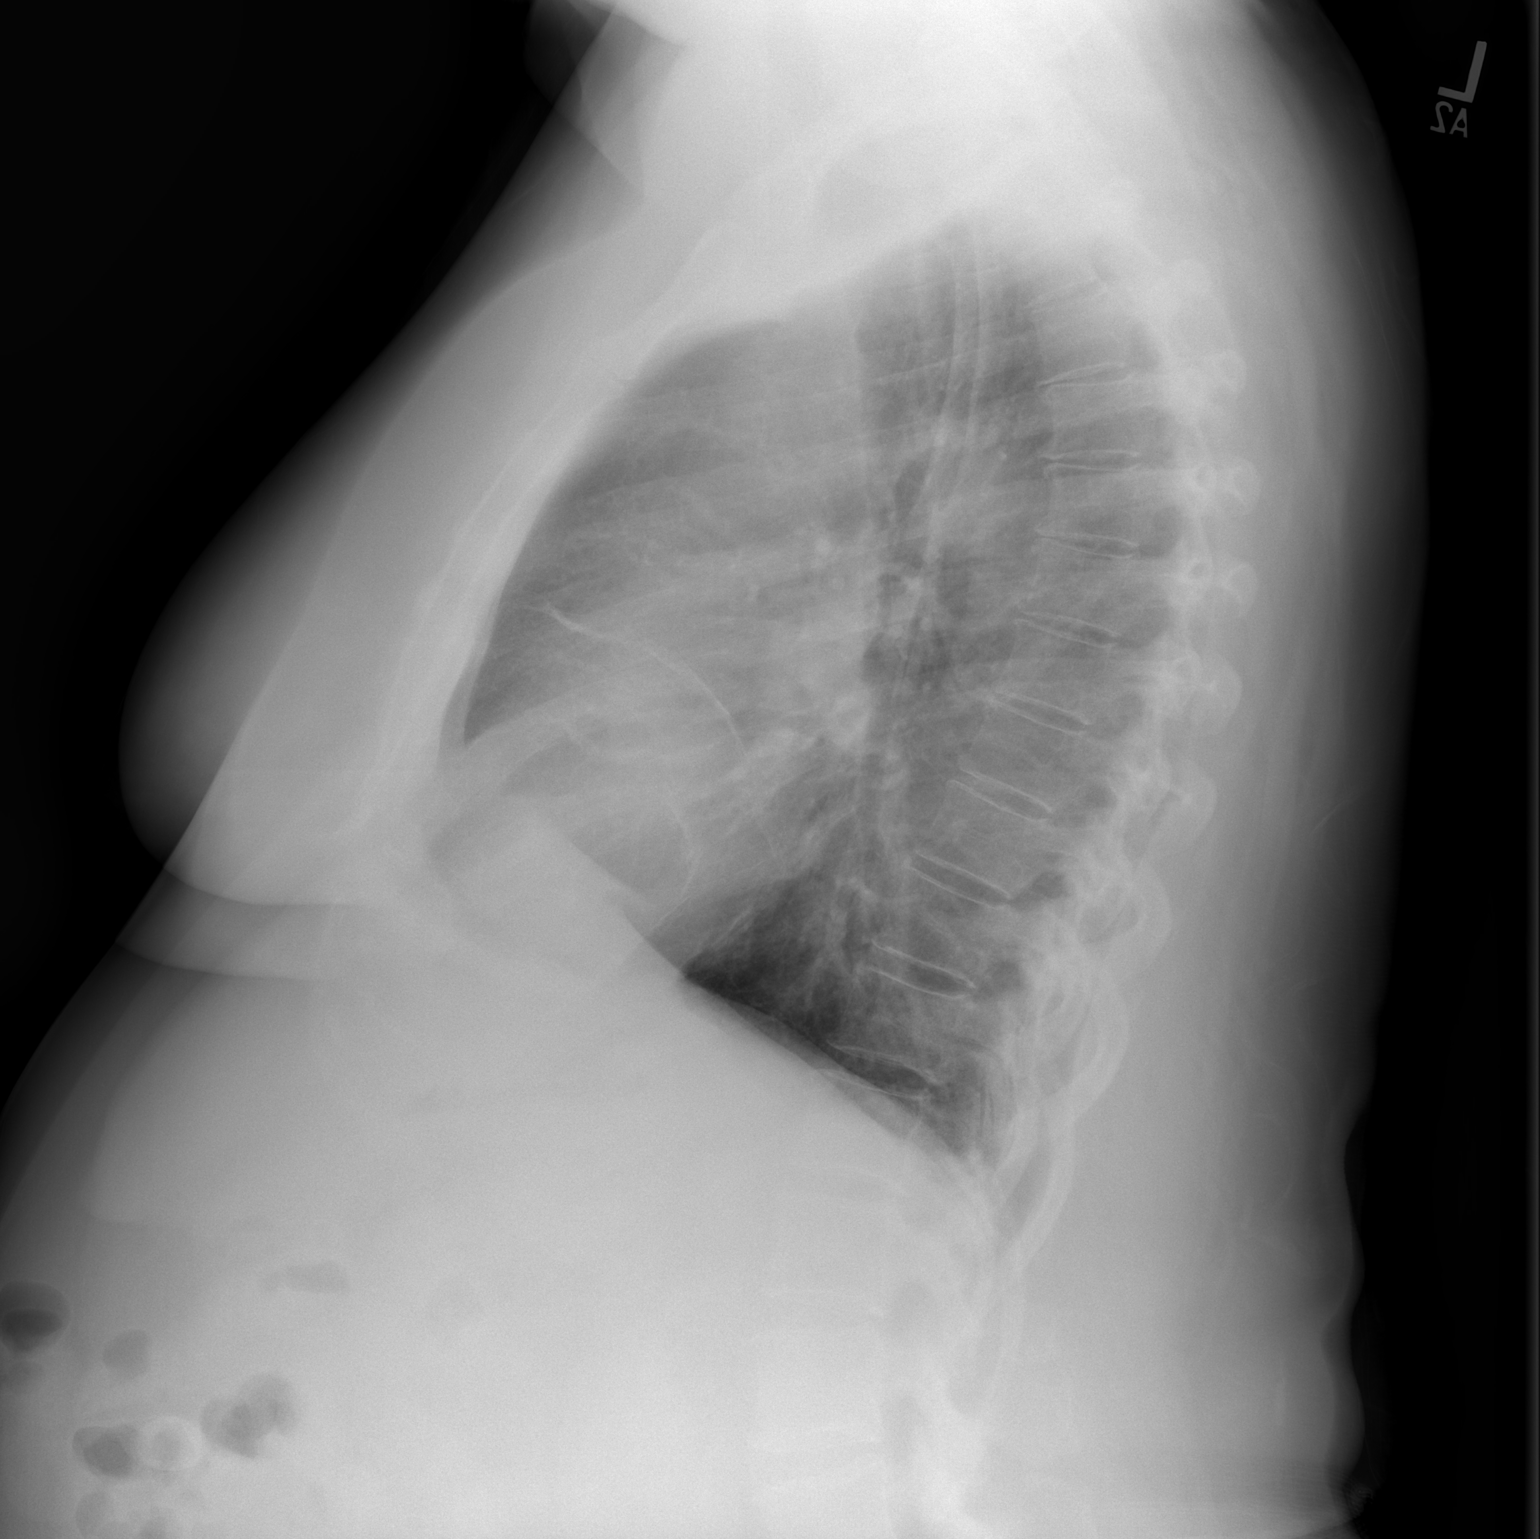

[2 of 2 positions shown; findings below may reference images not displayed]

FINDINGS: Linear scarring in the right middle lobe, unchanged. Heart is normal
size. No acute confluent airspace opacities or effusions. No acute
bony abnormality.
IMPRESSION: No active cardiopulmonary disease.

## 2018-11-11 ENCOUNTER — Other Ambulatory Visit: Payer: Self-pay | Admitting: Radiology

## 2019-02-24 ENCOUNTER — Ambulatory Visit: Payer: Medicare Other | Attending: Internal Medicine

## 2019-02-24 ENCOUNTER — Other Ambulatory Visit: Payer: Self-pay

## 2019-02-24 DIAGNOSIS — Z23 Encounter for immunization: Secondary | ICD-10-CM | POA: Insufficient documentation

## 2019-02-24 NOTE — Progress Notes (Signed)
   Covid-19 Vaccination Clinic  Name:  Christie Fox    MRN: [id number] DOB: [birth year]  02/24/2019  [name] was observed post Covid-19 immunization for 15 minutes without incidence. She was provided with Vaccine Information Sheet and instruction to access the V-Safe system.   [name] was instructed to call 911 with any severe reactions post vaccine: Marland Kitchen Difficulty breathing  . Swelling of your face and throat  . A fast heartbeat  . A bad rash all over your body  . Dizziness and weakness    Immunizations Administered    Name Date Dose VIS Date Route   Pfizer COVID-19 Vaccine 02/24/2019  8:45 AM 0.3 mL 01/10/2019 Intramuscular   Manufacturer: [place]   Lot: [id number]   [place]: [id number]

## 2019-03-17 ENCOUNTER — Ambulatory Visit: Payer: Medicare Other

## 2019-03-17 ENCOUNTER — Ambulatory Visit: Payer: Medicare Other | Attending: Internal Medicine

## 2019-03-17 DIAGNOSIS — Z23 Encounter for immunization: Secondary | ICD-10-CM | POA: Insufficient documentation

## 2019-03-17 NOTE — Progress Notes (Signed)
   Covid-19 Vaccination Clinic  Name:  Christie Fox    MRN: [id number] DOB: 12/27/1955  03/17/2019  [name] was observed post Covid-19 immunization for 15 minutes without incidence. She was provided with Vaccine Information Sheet and instruction to access the V-Safe system.   [name] was instructed to call 911 with any severe reactions post vaccine: Marland Kitchen Difficulty breathing  . Swelling of your face and throat  . A fast heartbeat  . A bad rash all over your body  . Dizziness and weakness    Immunizations Administered    Name Date Dose VIS Date Route   Pfizer COVID-19 Vaccine 03/17/2019  8:52 AM 0.3 mL 01/10/2019 Intramuscular   Manufacturer: [place]   Lot: [id number]   [place]: [id number]

## 2019-10-20 ENCOUNTER — Encounter (HOSPITAL_COMMUNITY): Payer: Self-pay

## 2020-10-13 ENCOUNTER — Encounter (HOSPITAL_COMMUNITY): Payer: Self-pay | Admitting: *Deleted

## 2021-09-11 LAB — COLOGUARD

## 2021-09-27 LAB — COLOGUARD: COLOGUARD: NEGATIVE

## 2021-09-30 ENCOUNTER — Encounter (HOSPITAL_COMMUNITY): Payer: Self-pay | Admitting: *Deleted

## 2021-12-27 ENCOUNTER — Other Ambulatory Visit: Payer: Self-pay | Admitting: Radiology

## 2021-12-29 ENCOUNTER — Encounter: Payer: Self-pay | Admitting: Family Medicine

## 2021-12-29 ENCOUNTER — Telehealth: Payer: Self-pay | Admitting: Hematology and Oncology

## 2021-12-29 NOTE — Telephone Encounter (Signed)
Spoke to patient to confirm upcoming morning [facility] clinic appointment on 12/6, paperwork will be sent via [place].   Gave location and time, also informed patient that the surgeon's office would be calling as well to get information from them similar to the packet that they will be receiving so make sure to do both.  Reminded patient that all providers will be coming to the clinic to see them HERE and if they had any questions to not hesitate to reach back out to myself or their navigators.

## 2021-12-30 ENCOUNTER — Encounter: Payer: Self-pay | Admitting: *Deleted

## 2021-12-30 DIAGNOSIS — C50419 Malignant neoplasm of upper-outer quadrant of unspecified female breast: Secondary | ICD-10-CM | POA: Insufficient documentation

## 2021-12-30 DIAGNOSIS — D0511 Intraductal carcinoma in situ of right breast: Secondary | ICD-10-CM | POA: Insufficient documentation

## 2022-01-02 ENCOUNTER — Encounter: Payer: Self-pay | Admitting: *Deleted

## 2022-01-02 NOTE — Progress Notes (Signed)
Radiation Oncology         [phone] ________________________________  Name: Christie Fox        MRN: [id number]  Date of Service: 01/04/2022 DOB: 1955/05/25  [id number], Beatris Si, MD     REFERRING PHYSICIAN: Rolm Bookbinder, MD   DIAGNOSIS: The encounter diagnosis was Ductal carcinoma in situ (DCIS) of right breast.   HISTORY OF PRESENT ILLNESS: Christie Fox is a 66 y.o. female seen in the multidisciplinary breast clinic for a new diagnosis of right breast cancer. The patient was noted to have right screening detected asymmetry in the 10 to 11 o'clock position of the right breast.  By ultrasound there was a 1.5 cm area 1 o'clock position consistent with masslike features. No abnormalities were seen sonographically in her right axilla.  She underwent biopsies on 12/27/2021 that showed intermediate grade DCIS, the 1030 specimen partially involved a complex sclerosing lesion.  Calcifications and necrosis were seen in both biopsy specimens, and the cancer was ER/PR positive in both specimens as well.  She is seen today to discuss treatment recommendations for her cancer.Marland Kitchen    PREVIOUS RADIATION THERAPY: {EXAM; YES/NO:19492::"No"}   PAST MEDICAL HISTORY:  Past Medical History:  Diagnosis Date   Anxiety    Arthritis    knees   At risk for sleep apnea    STOP-BANG= 4    SENT TO PCP 07-31-2013   Borderline diabetes    diet controlled   Dysuria    History of kidney stones    Hyperlipidemia    Hypertension    Hypothyroidism    Migraines    PONV (postoperative nausea and vomiting)    "please call me [name] to wake me up , not Mrs Mcduffee"    Renal calculus, left    Urgency of urination        PAST SURGICAL HISTORY: Past Surgical History:  Procedure Laterality Date   CESAREAN SECTION  x2   CYSTOSCOPY WITH RETROGRADE PYELOGRAM, URETEROSCOPY AND STENT PLACEMENT Left 08/04/2013   Procedure: 1ST STAGE CYSTOSCOPY WITH RETROGRADE PYELOGRAM,  URETEROSCOPY AND STENT PLACEMENT;  Surgeon: Alexis Frock, MD;  Location: [facility];  Service: Urology;  Laterality: Left;   CYSTOSCOPY WITH RETROGRADE PYELOGRAM, URETEROSCOPY AND STENT PLACEMENT Left 08/20/2013   Procedure: 2ND STAGE CYSTOSCOPY WITH RETROGRADE PYELOGRAM, URETEROSCOPY BASKET STONES AND STENT EXCHANGE, BLOOD MOP;  Surgeon: Alexis Frock, MD;  [facility];  Service: Urology;  Laterality: Left;   EXPLORATORY LAPAROTOMY W/ BILATERAL SALPINOOPHORECTOMY  11-27-2001   HOLMIUM LASER APPLICATION Left 5/0/3546   Procedure: HOLMIUM LASER APPLICATION;  Surgeon: Alexis Frock, MD;  Location: [facility];  Service: Urology;  Laterality: Left;   LAPAROSCOPIC GASTRIC SLEEVE RESECTION N/A 03/27/2017   Procedure: LAPAROSCOPIC GASTRIC SLEEVE RESECTION WITH UPPER ENDO;  Surgeon: Alphonsa Overall, MD;  Location: [facility];  Service: General;  Laterality: N/A;   LEFT URETEROSCOPIC LASER LITHO STONE EXTRACTION AND STENT PLACEMENT  03-25-2005   LUMBAR FUSION  X2   LAST ONE 2000   ORIF LEFT ANKLE FX  03-20-2000   RETAINED HARDWARE   VAGINAL HYSTERECTOMY  1999   WISDOM TOOTH EXTRACTION       FAMILY HISTORY: No family history on file.   SOCIAL HISTORY:  reports that she has never smoked. She has never used smokeless tobacco. She reports current alcohol use. She reports that she does not use drugs.  The patient is married and lives in [place].  She***   ALLERGIES: Imitrex [sumatriptan], Butorphanol,  Chlorpromazine, Etodolac, and Oxycodone   MEDICATIONS:  Current Outpatient Medications  Medication Sig Dispense Refill   ALPRAZolam (XANAX) 1 MG tablet Take 1 mg by mouth 3 (three) times daily as needed for anxiety.     Brimonidine Tartrate (LUMIFY) 0.025 % SOLN Place 1 drop into both eyes daily as needed (for irritated/dry eyes.). LUMIFY     fenofibrate micronized (LOFIBRA) 134 MG capsule Take 134 mg by mouth daily.     furosemide (LASIX) 40 MG tablet Take 40 mg by mouth daily.      hydroxypropyl methylcellulose / hypromellose (ISOPTO TEARS / GONIOVISC) 2.5 % ophthalmic solution Place 1 drop into both eyes 3 (three) times daily as needed for dry eyes. INSTANT TEARS     levothyroxine (SYNTHROID, LEVOTHROID) 100 MCG tablet Take 100 mcg by mouth daily before breakfast.     perindopril (ACEON) 4 MG tablet Take 4 mg by mouth daily.      rosuvastatin (CRESTOR) 10 MG tablet Take 10 mg by mouth daily at 6 PM. (1700)     No current facility-administered medications for this visit.     REVIEW OF SYSTEMS: On review of systems, the patient reports that she is doing ***     PHYSICAL EXAM:  Wt Readings from Last 3 Encounters:  12/18/17 222 lb 12.8 oz (101.1 kg)  10/16/17 226 lb 3.2 oz (102.6 kg)  05/25/17 230 lb 3.2 oz (104.4 kg)   Temp Readings from Last 3 Encounters:  03/28/17 98.1 F (36.7 C) (Oral)  03/20/17 98 F (36.7 C) (Oral)  08/20/13 97.7 F (36.5 C)   BP Readings from Last 3 Encounters:  03/28/17 117/71  03/20/17 138/86  08/20/13 123/79   Pulse Readings from Last 3 Encounters:  03/28/17 83  03/20/17 80  08/20/13 (!) 106    In general this is a well appearing. Caucasian female in no acute distress. She's alert and oriented x4 and appropriate throughout the examination. Cardiopulmonary assessment is negative for acute distress and she exhibits normal effort. Bilateral breast exam is deferred.    ECOG = ***  0 - Asymptomatic (Fully active, able to carry on all predisease activities without restriction)  1 - Symptomatic but completely ambulatory (Restricted in physically strenuous activity but ambulatory and able to carry out work of a light or sedentary nature. For example, light housework, office work)  2 - Symptomatic, <50% in bed during the day (Ambulatory and capable of all self care but unable to carry out any work activities. Up and about more than 50% of waking hours)  3 - Symptomatic, >50% in bed, but not bedbound (Capable of only limited  self-care, confined to bed or chair 50% or more of waking hours)  4 - Bedbound (Completely disabled. Cannot carry on any self-care. Totally confined to bed or chair)  5 - Death   Eustace Pen MM, Creech RH, Tormey DC, et al. [phone]). "Toxicity and response criteria of the [facility]". [place] Oncol. 5 (6): 649-55    LABORATORY DATA:  Lab Results  Component Value Date   WBC 10.0 03/28/2017   HGB 13.2 03/28/2017   HCT 39.6 03/28/2017   MCV 96.4 03/28/2017   PLT 301 03/28/2017   Lab Results  Component Value Date   NA 141 03/20/2017   K 4.4 03/20/2017   CL 104 03/20/2017   CO2 25 03/20/2017   Lab Results  Component Value Date   ALT 27 03/20/2017   AST 32 03/20/2017   ALKPHOS 59  03/20/2017   BILITOT 0.6 03/20/2017      RADIOGRAPHY: No results found.     IMPRESSION/PLAN: 1. Intermediate grade, ER/PR positive DCIS of the right breast. Dr. Lisbeth Renshaw discusses the pathology findings and reviews the nature of early stage right breast disease. The consensus from the breast conference includes breast conservation with lumpectomy. She would benefit from external radiotherapy to the breast  to reduce risks of local recurrence followed by antiestrogen therapy. We discussed the risks, benefits, short, and long term effects of radiotherapy, as well as the curative intent, and the patient is interested in proceeding. Dr. Lisbeth Renshaw discusses the delivery and logistics of radiotherapy and anticipates a course of 4 weeks of radiotherapy to the right breast. We will see her back a few weeks after surgery to discuss the simulation process and anticipate we starting radiotherapy about 4-6 weeks after surgery.  2. Possible genetic predisposition to malignancy. The patient is a candidate for genetic testing given her personal  history. She will meet with our geneticist today in clinic.   In a visit lasting *** minutes, greater than 50% of the time was spent face to face reviewing her  case, as well as in preparation of, discussing, and coordinating the patient's care.  The above documentation reflects my direct findings during this shared patient visit. Please see the separate note by Dr. Lisbeth Renshaw on this date for the remainder of the patient's plan of care.    Carola Rhine, [facility]    **Disclaimer: This note was dictated with voice recognition software. Similar sounding words can inadvertently be transcribed and this note may contain transcription errors which may not have been corrected upon publication of note.**

## 2022-01-03 ENCOUNTER — Other Ambulatory Visit: Payer: Self-pay | Admitting: *Deleted

## 2022-01-03 DIAGNOSIS — D0511 Intraductal carcinoma in situ of right breast: Secondary | ICD-10-CM

## 2022-01-04 ENCOUNTER — Inpatient Hospital Stay: Payer: Medicare Other | Attending: Hematology and Oncology | Admitting: Hematology and Oncology

## 2022-01-04 ENCOUNTER — Ambulatory Visit
Admission: RE | Admit: 2022-01-04 | Discharge: 2022-01-04 | Disposition: A | Discharge status: Home / Self Care | Payer: Medicare Other | Source: Ambulatory Visit | Attending: Radiation Oncology | Admitting: Radiation Oncology

## 2022-01-04 ENCOUNTER — Encounter: Payer: Self-pay | Admitting: Genetic Counselor

## 2022-01-04 ENCOUNTER — Encounter: Payer: Self-pay | Admitting: Hematology and Oncology

## 2022-01-04 ENCOUNTER — Inpatient Hospital Stay: Payer: Medicare Other | Admitting: Licensed Clinical Social Worker

## 2022-01-04 ENCOUNTER — Inpatient Hospital Stay: Payer: Medicare Other

## 2022-01-04 ENCOUNTER — Encounter: Payer: Self-pay | Admitting: *Deleted

## 2022-01-04 ENCOUNTER — Inpatient Hospital Stay (HOSPITAL_BASED_OUTPATIENT_CLINIC_OR_DEPARTMENT_OTHER): Payer: Medicare Other | Admitting: Genetic Counselor

## 2022-01-04 VITALS — BP 115/84 | HR 79 | Temp 97.5°F | Resp 18 | Ht 62.0 in | Wt 216.1 lb

## 2022-01-04 DIAGNOSIS — E785 Hyperlipidemia, unspecified: Secondary | ICD-10-CM | POA: Insufficient documentation

## 2022-01-04 DIAGNOSIS — Z79899 Other long term (current) drug therapy: Secondary | ICD-10-CM | POA: Diagnosis not present

## 2022-01-04 DIAGNOSIS — Z170 Estrogen receptor positive status [ER+]: Secondary | ICD-10-CM | POA: Insufficient documentation

## 2022-01-04 DIAGNOSIS — D0511 Intraductal carcinoma in situ of right breast: Secondary | ICD-10-CM

## 2022-01-04 DIAGNOSIS — I10 Essential (primary) hypertension: Secondary | ICD-10-CM | POA: Diagnosis not present

## 2022-01-04 DIAGNOSIS — Z79890 Hormone replacement therapy (postmenopausal): Secondary | ICD-10-CM | POA: Insufficient documentation

## 2022-01-04 DIAGNOSIS — Z87442 Personal history of urinary calculi: Secondary | ICD-10-CM | POA: Insufficient documentation

## 2022-01-04 DIAGNOSIS — E039 Hypothyroidism, unspecified: Secondary | ICD-10-CM | POA: Insufficient documentation

## 2022-01-04 DIAGNOSIS — F419 Anxiety disorder, unspecified: Secondary | ICD-10-CM | POA: Insufficient documentation

## 2022-01-04 DIAGNOSIS — Z803 Family history of malignant neoplasm of breast: Secondary | ICD-10-CM

## 2022-01-04 LAB — CBC WITH DIFFERENTIAL (CANCER CENTER ONLY)
Abs Immature Granulocytes: 0.04 10*3/uL (ref 0.00–0.07)
Basophils Absolute: 0.1 10*3/uL (ref 0.0–0.1)
Basophils Relative: 1 %
Eosinophils Absolute: 0.2 10*3/uL (ref 0.0–0.5)
Eosinophils Relative: 3 %
HCT: 40.9 % (ref 36.0–46.0)
Hemoglobin: 13.6 g/dL (ref 12.0–15.0)
Immature Granulocytes: 1 %
Lymphocytes Relative: 16 %
Lymphs Abs: 1.3 10*3/uL (ref 0.7–4.0)
MCH: 30.8 pg (ref 26.0–34.0)
MCHC: 33.3 g/dL (ref 30.0–36.0)
MCV: 92.5 fL (ref 80.0–100.0)
Monocytes Absolute: 0.8 10*3/uL (ref 0.1–1.0)
Monocytes Relative: 10 %
Neutro Abs: 5.4 10*3/uL (ref 1.7–7.7)
Neutrophils Relative %: 69 %
Platelet Count: 352 10*3/uL (ref 150–400)
RBC: 4.42 MIL/uL (ref 3.87–5.11)
RDW: 12.6 % (ref 11.5–15.5)
WBC Count: 7.8 10*3/uL (ref 4.0–10.5)
nRBC: 0 % (ref 0.0–0.2)

## 2022-01-04 LAB — CMP (CANCER CENTER ONLY)
ALT: 12 U/L (ref 0–44)
AST: 18 U/L (ref 15–41)
Albumin: 4.1 g/dL (ref 3.5–5.0)
Alkaline Phosphatase: 77 U/L (ref 38–126)
Anion gap: 8 (ref 5–15)
BUN: 19 mg/dL (ref 8–23)
CO2: 35 mmol/L — ABNORMAL HIGH (ref 22–32)
Calcium: 10.1 mg/dL (ref 8.9–10.3)
Chloride: 97 mmol/L — ABNORMAL LOW (ref 98–111)
Creatinine: 0.98 mg/dL (ref 0.44–1.00)
GFR, Estimated: >60 mL/min (ref >60)
Glucose, Bld: 103 mg/dL — ABNORMAL HIGH (ref 70–99)
Potassium: 3.1 mmol/L — ABNORMAL LOW (ref 3.5–5.1)
Sodium: 140 mmol/L (ref 135–145)
Total Bilirubin: 0.7 mg/dL (ref 0.3–1.2)
Total Protein: 7.3 g/dL (ref 6.5–8.1)

## 2022-01-04 LAB — GENETIC SCREENING ORDER

## 2022-01-04 NOTE — Progress Notes (Signed)
REFERRING PROVIDER: Nicholas Lose, MD [place],  [place] [id number]   PRIMARY PROVIDER:  Dewayne Shorter, PA-C  PRIMARY REASON FOR VISIT:  1. Ductal carcinoma in situ (DCIS) of right breast   2. Family history of breast cancer     HISTORY OF PRESENT ILLNESS:   Christie Fox, a 66 y.o. female, was seen for a [place] cancer genetics consultation at the request of Dr. Lindi Adie during the breast multidisciplinary clinic due to a personal and family history of breast cancer.  Christie Fox presents to clinic today to discuss the possibility of a hereditary predisposition to cancer, to discuss genetic testing, and to further clarify her future cancer risks, as well as potential cancer risks for family members.   In November 2023, at the age of 36, Christie Fox had two right breast biopsies, which both showed ductal carcinoma in situ of the right breast (ER+/PR+).  The treatment plan is pending.   CANCER HISTORY:  Oncology History  Ductal carcinoma in situ (DCIS) of right breast  12/27/2021 Initial Diagnosis   Screening detected right breast asymmetry upper outer quadrant: 1.5 cm, right medial quadrant calcifications spanning 3 cm, axilla negative, biopsy of both revealed intermediate grade DCIS involving CSL ER 100%, PR 60%   01/04/2022 Cancer Staging   Staging form: Breast, AJCC 8th Edition - Clinical stage from 01/04/2022: Stage 0 (cTis (DCIS), cN0, cM0, G2, ER+, PR+, HER2: Not Assessed) - Signed by Nicholas Lose, MD on 01/04/2022 Stage prefix: Initial diagnosis Histologic grading system: 3 grade system      Past Medical History:  Diagnosis Date   Anxiety    Arthritis    knees   At risk for sleep apnea    STOP-BANG= 4    SENT TO PCP 07-31-2013   Borderline diabetes    diet controlled   Breast cancer ([place])    Dysuria    History of kidney stones    Hyperlipidemia    Hypertension    Hypothyroidism    Migraines    PONV (postoperative nausea and vomiting)     "please call me Kristyl to wake me up , not Mrs [place]al[place]"    Renal calculus, left    Urgency of urination     Past Surgical History:  Procedure Laterality Date   CESAREAN SECTION  x2   CYSTOSCOPY WITH RETROGRADE PYELOGRAM, URETEROSCOPY AND STENT PLACEMENT Left 08/04/2013   Procedure: 1ST STAGE CYSTOSCOPY WITH RETROGRADE PYELOGRAM, URETEROSCOPY AND STENT PLACEMENT;  Surgeon: Alexis Frock, MD;  [facility];  Service: Urology;  Laterality: Left;   CYSTOSCOPY WITH RETROGRADE PYELOGRAM, URETEROSCOPY AND STENT PLACEMENT Left 08/20/2013   Procedure: 2ND STAGE CYSTOSCOPY WITH RETROGRADE PYELOGRAM, URETEROSCOPY BASKET STONES AND STENT EXCHANGE, BLOOD MOP;  Surgeon: Alexis Frock, MD;  [facility];  Service: Urology;  Laterality: Left;   EXPLORATORY LAPAROTOMY W/ BILATERAL SALPINOOPHORECTOMY  11-27-2001   HOLMIUM LASER APPLICATION Left 03/06/971   Procedure: HOLMIUM LASER APPLICATION;  Surgeon: Alexis Frock, MD;  [facility];  Service: Urology;  Laterality: Left;   LAPAROSCOPIC GASTRIC SLEEVE RESECTION N/A 03/27/2017   Procedure: LAPAROSCOPIC GASTRIC SLEEVE RESECTION WITH UPPER ENDO;  Surgeon: Alphonsa Overall, MD;  [facility]: [facility];  Service: General;  Laterality: N/A;   LEFT URETEROSCOPIC LASER LITHO STONE EXTRACTION AND STENT PLACEMENT  03-25-2005   LUMBAR FUSION  X2   LAST ONE 2000   ORIF LEFT ANKLE FX  03-20-2000   RETAINED HARDWARE   [place]  WISDOM TOOTH EXTRACTION      FAMILY HISTORY:  We obtained a detailed, 4-generation family history.  Significant diagnoses are listed below: Family History  Problem Relation Age of Onset   Breast cancer Mother 71     Christie Fox is unaware of previous family history of genetic testing for hereditary cancer risks.  There is no reported Ashkenazi Jewish ancestry. There is no known consanguinity.  GENETIC COUNSELING ASSESSMENT: Christie Fox is a 66 y.o. female with a personal and family history which is  somewhat suggestive of a hereditary cancer syndrome and predisposition to cancer given the presence of breast cancer in multiple generations. We, therefore, discussed and recommended the following at today's visit.   DISCUSSION: We discussed that 5 - 10% of cancer is hereditary.  Most cases of hereditary breast cancer are associated with mutations in BRCA1/2.  There are other genes that can be associated with hereditary breast cancer syndromes.  We discussed that testing is beneficial for several reasons including knowing how to follow individuals for their cancer risks and understanding if other family members could be at risk for cancer and allowing them to undergo genetic testing.   We reviewed the characteristics, features and inheritance patterns of hereditary cancer syndromes. We also discussed genetic testing, including the appropriate family members to test, the process of testing, i and turn-around-time for results. We discussed the implications of a negative and positive result.   We discussed that if both of her two right breast masses are considered two separate primaries, she would meet medical criteria for genetic testing.   PLAN:  Christie Fox did not wish to pursue genetic testing at today's visit.  She believes there is a low chance that this information would impact her family's medical management. We understand this decision and remain available to coordinate genetic testing at any time in the future. We, therefore, recommend Christie Fox continue to follow the cancer screening guidelines given by her primary healthcare provider.  Lastly, we encouraged Christie Fox to remain in contact with cancer genetics so that we can continuously update the family history and inform her of any changes in cancer genetics and testing that may be of benefit for this family.   Christie Fox questions were answered to her satisfaction today. Our contact information was provided should additional questions or  concerns arise. Thank you for the referral and allowing [place] to share in the care of your patient.   Zaidyn Claire M. Joette Catching, [place], [facility] Genetic Counselor [email or web address] (P) [phone]  The patient was seen for a total of 10 minutes in face-to-face genetic counseling.  She was accompanied by her husband, [name]. Drs. Lindi Adie was available to discuss this case as needed.    _______________________________________________________________________ For Office Staff:  Number of people involved in session: 2 Was an Intern/ student involved with case: no

## 2022-01-04 NOTE — Research (Signed)
MTG-015 - Discovery and Evaluation of  Biomarkers/Pharmacogenomics for the Diagnosis and Personalized Management of Patients   Patient Christie Fox was identified by Dr.Gudena as a potential candidate for the above listed study.  This Clinical Research Coordinator met with ANIYLA HARLING, [id number], on 01/04/22 in a manner and location that ensures patient privacy to discuss participation in the above listed research study.  Patient is Accompanied by her husband .  A copy of the informed consent document and separate HIPAA Authorization was provided to the patient.  Patient reads, speaks, and understands [place].   Patient was provided with the business card of this Coordinator and encouraged to contact the research team with any questions.  Approximately 15 minutes were spent with the patient reviewing the informed consent documents.  Patient was provided the option of taking informed consent documents home to review and was encouraged to review at their convenience with their support network, including other care providers. Patient is comfortable with making a decision regarding study participation today.   The patient declined participation, siting the reason was due to them not liking blood donations and blood collections.  Johny Drilling, [facility] 01/04/2022 12:34 PM

## 2022-01-04 NOTE — Assessment & Plan Note (Signed)
12/27/2021:Screening detected right breast asymmetry upper outer quadrant: 1.5 cm, right medial quadrant calcifications spanning 3 cm, axilla negative, biopsy of both revealed intermediate grade DCIS involving CSL ER 100%, PR 60%  Pathology review: I discussed with the patient the difference between DCIS and invasive breast cancer. It is considered a precancerous lesion. DCIS is classified as a 0. It is generally detected through mammograms as calcifications. We discussed the significance of grades and its impact on prognosis. We also discussed the importance of ER and PR receptors and their implications to adjuvant treatment options. Prognosis of DCIS dependence on grade, comedo necrosis. It is anticipated that if not treated, 20-30% of DCIS can develop into invasive breast cancer.  Recommendation: Breast MRI to determine the extent of disease 1. Breast conserving surgery 2. Followed by adjuvant radiation therapy 3. Followed by antiestrogen therapy with tamoxifen 5 years  Tamoxifen counseling: We discussed the risks and benefits of tamoxifen. These include but not limited to insomnia, hot flashes, mood changes, vaginal dryness, and weight gain. Although rare, serious side effects including endometrial cancer, risk of blood clots were also discussed. We strongly believe that the benefits far outweigh the risks. Patient understands these risks and consented to starting treatment. Planned treatment duration is 5 years.  Return to clinic after surgery to discuss the final pathology report and come up with an adjuvant treatment plan.

## 2022-01-04 NOTE — Progress Notes (Signed)
[place] Work  Initial Assessment   Christie Fox is a 66 y.o. year old female accompanied by husband, Christie Fox. Clinical Social Work was referred by  [facility]  for assessment of psychosocial needs.   SDOH (Social Determinants of Health) assessments performed: Yes SDOH Interventions    Flowsheet Row Clinical Support from 01/04/2022 in [facility]  SDOH Interventions   Food Insecurity Interventions Intervention Not Indicated  Housing Interventions Intervention Not Indicated  Transportation Interventions Intervention Not Indicated  Financial Strain Interventions Intervention Not Indicated       SDOH Screenings   Food Insecurity: No Food Insecurity (01/04/2022)  Housing: Low Risk  (01/04/2022)  Transportation Needs: No Transportation Needs (01/04/2022)  Financial Resource Strain: Low Risk  (01/04/2022)  Tobacco Use: Low Risk  (01/04/2022)     Distress Screen completed: Yes    01/04/2022   12:10 PM  ONCBCN DISTRESS SCREENING  Screening Type Initial Screening  Distress experienced in past week (1-10) 7  Practical problem type Insurance  Emotional problem type Nervousness/Anxiety;Adjusting to illness  Information Concerns Type Lack of info about treatment;Lack of info about complementary therapy choices      Family/Social Information:  Housing Arrangement: patient lives with spouse Family members/support persons in your life? Family (2 daughters & their families are local) and Friends Transportation concerns: no  Employment: Retired .  Income source: Paediatric nurse concerns: No Type of concern: None Food access concerns: no Services Currently in place:  medication for anxiety   Coping/ Adjustment to diagnosis: Patient understands treatment plan and what happens next? Yes. She is feeling more relieved after meeting with the medical team. She does have a long history of anxiety and does take medication and has done  therapy in the past Concerns about diagnosis and/or treatment:  General adjustment to cancer Patient reported stressors: Anxiety/ nervousness and Adjusting to my illness Patient enjoys  quiet time, time with spouse Current coping skills/ strengths: Ability for insight , Communication skills , Motivation for treatment/growth , and Supportive family/friends     SUMMARY: Current SDOH Barriers:  [place] Work Clinical Goal(s):  Patient will continue to utilize her medical, social, and coping skill supports and will reach out for additional support as needed related to mental health  Interventions: Discussed common feeling and emotions when being diagnosed with cancer, and the importance of support during treatment Informed patient of the support team roles and support services at [facility] Provided CSW contact information and encouraged patient to call with any questions or concerns   Follow Up Plan: Patient will contact CSW with any support or resource needs Patient verbalizes understanding of plan: Yes    Sinead Hockman E Diala Waxman, LCSW

## 2022-01-04 NOTE — Progress Notes (Signed)
[place] NOTE  Patient Care Team: Windy Canny as PCP - General (Physician Assistant) Mauro Kaufmann, RN as Oncology Nurse Navigator Rockwell Germany, RN as Oncology Nurse Navigator Nicholas Lose, MD as Consulting Physician (Hematology and Oncology) Rolm Bookbinder, MD as Consulting Physician (General Surgery) Kyung Rudd, MD as Consulting Physician (Radiation Oncology)  CHIEF COMPLAINTS/PURPOSE OF CONSULTATION:  Newly diagnosed right breast DCIS  HISTORY OF PRESENTING ILLNESS:  Christie Fox 66 y.o. female is here because of recent diagnosis of right breast DCIS.  She had a routine screening mammogram that detected a right breast asymmetry measuring 1.5 cm and calcifications measuring 3 cm.  Biopsy of both of these revealed intermediate grade DCIS involving a complex sclerosing lesion that was ER/PR positive.  She was presented this morning in the multidisciplinary tumor board and she is here today accompanied by her husband to discuss her treatment plan.  I reviewed her records extensively and collaborated the history with the patient.  SUMMARY OF ONCOLOGIC HISTORY: Oncology History  Ductal carcinoma in situ (DCIS) of right breast  12/27/2021 Initial Diagnosis   Screening detected right breast asymmetry upper outer quadrant: 1.5 cm, right medial quadrant calcifications spanning 3 cm, axilla negative, biopsy of both revealed intermediate grade DCIS involving CSL ER 100%, PR 60%   01/04/2022 Cancer Staging   Staging form: Breast, AJCC 8th Edition - Clinical stage from 01/04/2022: Stage 0 (cTis (DCIS), cN0, cM0, G2, ER+, PR+, HER2: Not Assessed) - Signed by Nicholas Lose, MD on 01/04/2022 Stage prefix: Initial diagnosis Histologic grading system: 3 grade system      MEDICAL HISTORY:  Past Medical History:  Diagnosis Date   Anxiety    Arthritis    knees   At risk for sleep apnea    STOP-BANG= 4    SENT TO PCP 07-31-2013   Borderline  diabetes    diet controlled   Breast cancer (Progress)    Dysuria    History of kidney stones    Hyperlipidemia    Hypertension    Hypothyroidism    Migraines    PONV (postoperative nausea and vomiting)    "please call me Renelda to wake me up , not Mrs Mccroskey"    Renal calculus, left    Urgency of urination     SURGICAL HISTORY: Past Surgical History:  Procedure Laterality Date   CESAREAN SECTION  x2   CYSTOSCOPY WITH RETROGRADE PYELOGRAM, URETEROSCOPY AND STENT PLACEMENT Left 08/04/2013   Procedure: 1ST STAGE CYSTOSCOPY WITH RETROGRADE PYELOGRAM, URETEROSCOPY AND STENT PLACEMENT;  Surgeon: Alexis Frock, MD;  Location: [facility];  Service: Urology;  Laterality: Left;   CYSTOSCOPY WITH RETROGRADE PYELOGRAM, URETEROSCOPY AND STENT PLACEMENT Left 08/20/2013   Procedure: 2ND STAGE CYSTOSCOPY WITH RETROGRADE PYELOGRAM, URETEROSCOPY BASKET STONES AND STENT EXCHANGE, BLOOD MOP;  Surgeon: Alexis Frock, MD;  Location: [facility];  Service: Urology;  Laterality: Left;   EXPLORATORY LAPAROTOMY W/ BILATERAL SALPINOOPHORECTOMY  11-27-2001   HOLMIUM LASER APPLICATION Left 09/03/8848   Procedure: HOLMIUM LASER APPLICATION;  Surgeon: Alexis Frock, MD;  Location: [facility];  Service: Urology;  Laterality: Left;   LAPAROSCOPIC GASTRIC SLEEVE RESECTION N/A 03/27/2017   Procedure: LAPAROSCOPIC GASTRIC SLEEVE RESECTION WITH UPPER ENDO;  Surgeon: Alphonsa Overall, MD;  Location: [facility];  Service: General;  Laterality: N/A;   LEFT URETEROSCOPIC LASER LITHO STONE EXTRACTION AND STENT PLACEMENT  03-25-2005   LUMBAR FUSION  X2   LAST ONE 2000   ORIF LEFT ANKLE FX  03-20-2000   RETAINED HARDWARE   VAGINAL HYSTERECTOMY  1999   WISDOM TOOTH EXTRACTION      SOCIAL HISTORY: Social History   Socioeconomic History   Marital status: Married    Spouse name: Not on file   Number of children: Not on file   Years of education: Not on file   Highest education level: Not on file  Occupational History   Not  on file  Tobacco Use   Smoking status: Never   Smokeless tobacco: Never  Vaping Use   Vaping Use: Never used  Substance and Sexual Activity   Alcohol use: Yes    Comment: wine socially   Drug use: No   Sexual activity: Not on file  Other Topics Concern   Not on file  Social History Narrative   Not on file   Social Determinants of Health   Financial Resource Strain: Not on file  Food Insecurity: Not on file  Transportation Needs: Not on file  Physical Activity: Not on file  Stress: Not on file  Social Connections: Not on file  Intimate Partner Violence: Not on file    FAMILY HISTORY: Family History  Problem Relation Age of Onset   Breast cancer Mother     ALLERGIES:  is allergic to imitrex [sumatriptan], butorphanol, chlorpromazine, etodolac, and oxycodone.  MEDICATIONS:  Current Outpatient Medications  Medication Sig Dispense Refill   ALPRAZolam (XANAX) 1 MG tablet Take 1 mg by mouth 3 (three) times daily as needed for anxiety.     furosemide (LASIX) 40 MG tablet Take 40 mg by mouth daily.     levothyroxine (SYNTHROID, LEVOTHROID) 100 MCG tablet Take 100 mcg by mouth daily before breakfast.     perindopril (ACEON) 4 MG tablet Take 4 mg by mouth daily.      rosuvastatin (CRESTOR) 10 MG tablet Take 10 mg by mouth daily at 6 PM. (1700)     Brimonidine Tartrate (LUMIFY) 0.025 % SOLN Place 1 drop into both eyes daily as needed (for irritated/dry eyes.). LUMIFY (Patient not taking: Reported on 01/04/2022)     fenofibrate micronized (LOFIBRA) 134 MG capsule Take 134 mg by mouth daily. (Patient not taking: Reported on 01/04/2022)     hydroxypropyl methylcellulose / hypromellose (ISOPTO TEARS / GONIOVISC) 2.5 % ophthalmic solution Place 1 drop into both eyes 3 (three) times daily as needed for dry eyes. INSTANT TEARS (Patient not taking: Reported on 01/04/2022)     No current facility-administered medications for this visit.    REVIEW OF SYSTEMS:   Constitutional: Denies  fevers, chills or abnormal night sweats   All other systems were reviewed with the patient and are negative.  PHYSICAL EXAMINATION: ECOG PERFORMANCE STATUS: 1 - Symptomatic but completely ambulatory  Vitals:   01/04/22 0833  BP: 115/84  Pulse: 79  Resp: 18  Temp: (!) 97.5 F (36.4 C)  SpO2: 99%   Filed Weights   01/04/22 0833  Weight: 216 lb 1.6 oz (98 kg)    GENERAL:alert, no distress and comfortable    LABORATORY DATA:  I have reviewed the data as listed Lab Results  Component Value Date   WBC 7.8 01/04/2022   HGB 13.6 01/04/2022   HCT 40.9 01/04/2022   MCV 92.5 01/04/2022   PLT 352 01/04/2022   Lab Results  Component Value Date   NA 140 01/04/2022   K 3.1 (L) 01/04/2022   CL 97 (L) 01/04/2022   CO2 35 (H) 01/04/2022    RADIOGRAPHIC STUDIES: I have  personally reviewed the radiological reports and agreed with the findings in the report.  ASSESSMENT AND PLAN:  Ductal carcinoma in situ (DCIS) of right breast 12/27/2021:Screening detected right breast asymmetry upper outer quadrant: 1.5 cm, right medial quadrant calcifications spanning 3 cm, axilla negative, biopsy of both revealed intermediate grade DCIS involving CSL ER 100%, PR 60%  Pathology review: I discussed with the patient the difference between DCIS and invasive breast cancer. It is considered a precancerous lesion. DCIS is classified as a 0. It is generally detected through mammograms as calcifications. We discussed the significance of grades and its impact on prognosis. We also discussed the importance of ER and PR receptors and their implications to adjuvant treatment options. Prognosis of DCIS dependence on grade, comedo necrosis. It is anticipated that if not treated, 20-30% of DCIS can develop into invasive breast cancer.  Recommendation: Breast MRI to determine the extent of disease 1. Breast conserving surgery 2. Followed by adjuvant radiation therapy 3. Followed by antiestrogen therapy with  tamoxifen 5 years  Tamoxifen counseling: We discussed the risks and benefits of tamoxifen. These include but not limited to insomnia, hot flashes, mood changes, vaginal dryness, and weight gain. Although rare, serious side effects including endometrial cancer, risk of blood clots were also discussed. We strongly believe that the benefits far outweigh the risks. Patient understands these risks and consented to starting treatment. Planned treatment duration is 5 years.  Return to clinic after surgery to discuss the final pathology report and come up with an adjuvant treatment plan.   All questions were answered. The patient knows to call the clinic with any problems, questions or concerns.    Harriette Ohara, MD 01/04/22

## 2022-01-11 ENCOUNTER — Ambulatory Visit
Admission: RE | Admit: 2022-01-11 | Discharge: 2022-01-11 | Disposition: A | Discharge status: Home / Self Care | Payer: Medicare Other | Source: Ambulatory Visit | Attending: General Surgery | Admitting: General Surgery

## 2022-01-11 DIAGNOSIS — D0511 Intraductal carcinoma in situ of right breast: Secondary | ICD-10-CM

## 2022-01-11 MED ORDER — GADOPICLENOL 0.5 MMOL/ML IV SOLN
10.0000 mL | Freq: Once | INTRAVENOUS | Status: AC | PRN
  Administered 2022-01-11: 10 mL via INTRAVENOUS

## 2022-01-12 ENCOUNTER — Encounter: Payer: Self-pay | Admitting: *Deleted

## 2022-01-12 ENCOUNTER — Telehealth: Payer: Self-pay | Admitting: *Deleted

## 2022-01-12 NOTE — Telephone Encounter (Signed)
Spoke to pt concerning [place] form 01/04/22. Denies questions or concerns regarding dx or treatment care plan. Discussed MRI results have not been resulted as of this call. Informed pt she will receive a call with these results. Encourage pt to call with needs. Received verbal understanding.

## 2022-01-16 ENCOUNTER — Telehealth: Payer: Self-pay | Admitting: *Deleted

## 2022-01-16 ENCOUNTER — Other Ambulatory Visit: Payer: Self-pay | Admitting: *Deleted

## 2022-01-16 ENCOUNTER — Encounter: Payer: Self-pay | Admitting: General Surgery

## 2022-01-16 ENCOUNTER — Encounter: Payer: Self-pay | Admitting: *Deleted

## 2022-01-16 DIAGNOSIS — D0511 Intraductal carcinoma in situ of right breast: Secondary | ICD-10-CM

## 2022-01-16 DIAGNOSIS — R928 Other abnormal and inconclusive findings on diagnostic imaging of breast: Secondary | ICD-10-CM

## 2022-01-16 NOTE — Telephone Encounter (Signed)
Spoke to pt regarding MRI findings and recommendations for bilateral breast MRI bx. Received verbal understanding. Pt agree to bx. Denies further needs at this time.

## 2022-01-17 ENCOUNTER — Other Ambulatory Visit: Payer: Self-pay | Admitting: General Surgery

## 2022-01-17 ENCOUNTER — Encounter: Payer: Self-pay | Admitting: *Deleted

## 2022-01-17 DIAGNOSIS — R928 Other abnormal and inconclusive findings on diagnostic imaging of breast: Secondary | ICD-10-CM

## 2022-01-17 DIAGNOSIS — D0511 Intraductal carcinoma in situ of right breast: Secondary | ICD-10-CM

## 2022-02-01 ENCOUNTER — Ambulatory Visit
Admission: RE | Admit: 2022-02-01 | Discharge: 2022-02-01 | Disposition: A | Discharge status: Home / Self Care | Payer: Medicare Other | Source: Ambulatory Visit | Attending: General Surgery | Admitting: General Surgery

## 2022-02-01 DIAGNOSIS — D0511 Intraductal carcinoma in situ of right breast: Secondary | ICD-10-CM

## 2022-02-01 DIAGNOSIS — R928 Other abnormal and inconclusive findings on diagnostic imaging of breast: Secondary | ICD-10-CM

## 2022-02-01 MED ORDER — GADOPICLENOL 0.5 MMOL/ML IV SOLN
10.0000 mL | Freq: Once | INTRAVENOUS | Status: AC | PRN
  Administered 2022-02-01: 10 mL via INTRAVENOUS

## 2022-02-03 ENCOUNTER — Encounter: Payer: Self-pay | Admitting: *Deleted

## 2022-02-14 ENCOUNTER — Other Ambulatory Visit: Payer: Self-pay | Admitting: General Surgery

## 2022-02-14 DIAGNOSIS — D0511 Intraductal carcinoma in situ of right breast: Secondary | ICD-10-CM

## 2022-02-16 ENCOUNTER — Encounter: Payer: Self-pay | Admitting: *Deleted

## 2022-02-16 DIAGNOSIS — D0511 Intraductal carcinoma in situ of right breast: Secondary | ICD-10-CM

## 2022-02-17 ENCOUNTER — Other Ambulatory Visit: Payer: Self-pay | Admitting: Radiation Oncology

## 2022-02-17 ENCOUNTER — Inpatient Hospital Stay
Admission: RE | Admit: 2022-02-17 | Discharge: 2022-02-17 | Disposition: A | Discharge status: Home / Self Care | Payer: Self-pay | Source: Ambulatory Visit | Attending: Radiation Oncology | Admitting: Radiation Oncology

## 2022-02-17 ENCOUNTER — Ambulatory Visit
Admission: RE | Admit: 2022-02-17 | Discharge: 2022-02-17 | Disposition: A | Discharge status: Home / Self Care | Payer: Self-pay | Source: Ambulatory Visit | Attending: Radiation Oncology | Admitting: Radiation Oncology

## 2022-02-17 DIAGNOSIS — D0511 Intraductal carcinoma in situ of right breast: Secondary | ICD-10-CM

## 2022-02-20 ENCOUNTER — Other Ambulatory Visit: Payer: Self-pay

## 2022-02-20 ENCOUNTER — Encounter (HOSPITAL_BASED_OUTPATIENT_CLINIC_OR_DEPARTMENT_OTHER): Payer: Self-pay | Admitting: General Surgery

## 2022-02-20 ENCOUNTER — Encounter: Payer: Self-pay | Admitting: *Deleted

## 2022-02-23 ENCOUNTER — Encounter (HOSPITAL_BASED_OUTPATIENT_CLINIC_OR_DEPARTMENT_OTHER)
Admission: RE | Admit: 2022-02-23 | Discharge: 2022-02-23 | Disposition: A | Discharge status: Home / Self Care | Payer: Medicare Other | Source: Ambulatory Visit | Attending: General Surgery | Admitting: General Surgery

## 2022-02-23 NOTE — Progress Notes (Signed)

## 2022-02-24 ENCOUNTER — Encounter (HOSPITAL_BASED_OUTPATIENT_CLINIC_OR_DEPARTMENT_OTHER)
Admission: RE | Admit: 2022-02-24 | Discharge: 2022-02-24 | Disposition: A | Discharge status: Home / Self Care | Payer: Medicare Other | Source: Ambulatory Visit | Attending: General Surgery | Admitting: General Surgery

## 2022-02-24 DIAGNOSIS — Z01818 Encounter for other preprocedural examination: Secondary | ICD-10-CM | POA: Insufficient documentation

## 2022-02-24 LAB — BASIC METABOLIC PANEL
Anion gap: 10 (ref 5–15)
BUN: 18 mg/dL (ref 8–23)
CO2: 30 mmol/L (ref 22–32)
Calcium: 9.5 mg/dL (ref 8.9–10.3)
Chloride: 94 mmol/L — ABNORMAL LOW (ref 98–111)
Creatinine, Ser: 0.99 mg/dL (ref 0.44–1.00)
GFR, Estimated: >60 mL/min (ref >60)
Glucose, Bld: 123 mg/dL — ABNORMAL HIGH (ref 70–99)
Potassium: 3.4 mmol/L — ABNORMAL LOW (ref 3.5–5.1)
Sodium: 134 mmol/L — ABNORMAL LOW (ref 135–145)

## 2022-02-24 MED ORDER — ENSURE PRE-SURGERY PO LIQD: 296.0000 mL | Freq: Once | ORAL | Status: DC

## 2022-02-27 ENCOUNTER — Ambulatory Visit (HOSPITAL_BASED_OUTPATIENT_CLINIC_OR_DEPARTMENT_OTHER): Payer: Medicare Other | Admitting: Anesthesiology

## 2022-02-27 ENCOUNTER — Encounter (HOSPITAL_BASED_OUTPATIENT_CLINIC_OR_DEPARTMENT_OTHER): Payer: Self-pay | Admitting: General Surgery

## 2022-02-27 ENCOUNTER — Encounter (HOSPITAL_BASED_OUTPATIENT_CLINIC_OR_DEPARTMENT_OTHER)
Admission: RE | Disposition: A | Discharge status: Home / Self Care | Payer: Self-pay | Source: Home / Self Care | Attending: General Surgery

## 2022-02-27 ENCOUNTER — Other Ambulatory Visit: Payer: Self-pay

## 2022-02-27 ENCOUNTER — Ambulatory Visit (HOSPITAL_BASED_OUTPATIENT_CLINIC_OR_DEPARTMENT_OTHER)
Admission: RE | Admit: 2022-02-27 | Discharge: 2022-02-27 | Disposition: A | Discharge status: Home / Self Care | Payer: Medicare Other | Attending: General Surgery | Admitting: General Surgery

## 2022-02-27 DIAGNOSIS — I10 Essential (primary) hypertension: Secondary | ICD-10-CM | POA: Diagnosis not present

## 2022-02-27 DIAGNOSIS — D241 Benign neoplasm of right breast: Secondary | ICD-10-CM

## 2022-02-27 DIAGNOSIS — E039 Hypothyroidism, unspecified: Secondary | ICD-10-CM | POA: Insufficient documentation

## 2022-02-27 DIAGNOSIS — N6489 Other specified disorders of breast: Secondary | ICD-10-CM | POA: Diagnosis present

## 2022-02-27 DIAGNOSIS — F419 Anxiety disorder, unspecified: Secondary | ICD-10-CM | POA: Diagnosis not present

## 2022-02-27 DIAGNOSIS — D0511 Intraductal carcinoma in situ of right breast: Secondary | ICD-10-CM | POA: Insufficient documentation

## 2022-02-27 DIAGNOSIS — Z6839 Body mass index (BMI) 39.0-39.9, adult: Secondary | ICD-10-CM | POA: Diagnosis not present

## 2022-02-27 DIAGNOSIS — M1990 Unspecified osteoarthritis, unspecified site: Secondary | ICD-10-CM

## 2022-02-27 DIAGNOSIS — Z01818 Encounter for other preprocedural examination: Secondary | ICD-10-CM

## 2022-02-27 HISTORY — PX: RADIOACTIVE SEED GUIDED EXCISIONAL BREAST BIOPSY: SHX6490

## 2022-02-27 HISTORY — PX: BREAST LUMPECTOMY WITH RADIOACTIVE SEED LOCALIZATION: SHX6424

## 2022-02-27 SURGERY — BREAST LUMPECTOMY WITH RADIOACTIVE SEED LOCALIZATION
Anesthesia: General | Site: Breast | Laterality: Right

## 2022-02-27 MED ORDER — ACETAMINOPHEN 500 MG PO TABS
1000.0000 mg | ORAL_TABLET | ORAL | Status: AC
  Administered 2022-02-27: 1000 mg via ORAL

## 2022-02-27 MED ORDER — EPHEDRINE SULFATE (PRESSORS) 50 MG/ML IJ SOLN
INTRAMUSCULAR | Status: DC | PRN
  Administered 2022-02-27: 5 mg via INTRAVENOUS

## 2022-02-27 MED ORDER — BUPIVACAINE HCL (PF) 0.25 % IJ SOLN
INTRAMUSCULAR | Status: DC | PRN
  Administered 2022-02-27: 20 mL

## 2022-02-27 MED ORDER — PHENYLEPHRINE 80 MCG/ML (10ML) SYRINGE FOR IV PUSH (FOR BLOOD PRESSURE SUPPORT)
PREFILLED_SYRINGE | INTRAVENOUS | Status: AC
  Filled 2022-02-27: qty 10

## 2022-02-27 MED ORDER — PROPOFOL 500 MG/50ML IV EMUL
INTRAVENOUS | Status: DC | PRN
  Administered 2022-02-27: 150 ug/kg/min via INTRAVENOUS

## 2022-02-27 MED ORDER — PROPOFOL 10 MG/ML IV BOLUS
INTRAVENOUS | Status: DC | PRN
  Administered 2022-02-27: 160 mg via INTRAVENOUS

## 2022-02-27 MED ORDER — ACETAMINOPHEN 500 MG PO TABS
ORAL_TABLET | ORAL | Status: AC
  Filled 2022-02-27: qty 2

## 2022-02-27 MED ORDER — CHLORHEXIDINE GLUCONATE CLOTH 2 % EX PADS: 6.0000 | MEDICATED_PAD | Freq: Once | CUTANEOUS | Status: DC

## 2022-02-27 MED ORDER — EPHEDRINE 5 MG/ML INJ
INTRAVENOUS | Status: AC
  Filled 2022-02-27: qty 5

## 2022-02-27 MED ORDER — DEXAMETHASONE SODIUM PHOSPHATE 10 MG/ML IJ SOLN
INTRAMUSCULAR | Status: DC | PRN
  Administered 2022-02-27: 10 mg via INTRAVENOUS

## 2022-02-27 MED ORDER — FENTANYL CITRATE (PF) 100 MCG/2ML IJ SOLN
INTRAMUSCULAR | Status: AC
  Filled 2022-02-27: qty 2

## 2022-02-27 MED ORDER — TRAMADOL HCL 50 MG PO TABS: 50.0000 mg | ORAL_TABLET | Freq: Four times a day (QID) | ORAL | 0 refills | Status: DC | PRN

## 2022-02-27 MED ORDER — ONDANSETRON HCL 4 MG/2ML IJ SOLN
INTRAMUSCULAR | Status: DC | PRN
  Administered 2022-02-27: 4 mg via INTRAVENOUS

## 2022-02-27 MED ORDER — HYDROMORPHONE HCL 1 MG/ML IJ SOLN
INTRAMUSCULAR | Status: AC
  Filled 2022-02-27: qty 0.5

## 2022-02-27 MED ORDER — PHENYLEPHRINE HCL (PRESSORS) 10 MG/ML IV SOLN
INTRAVENOUS | Status: DC | PRN
  Administered 2022-02-27: 80 ug via INTRAVENOUS

## 2022-02-27 MED ORDER — ACETAMINOPHEN 325 MG PO TABS: 325.0000 mg | ORAL_TABLET | ORAL | Status: DC | PRN

## 2022-02-27 MED ORDER — PROPOFOL 10 MG/ML IV BOLUS
INTRAVENOUS | Status: AC
  Filled 2022-02-27: qty 20

## 2022-02-27 MED ORDER — OXYCODONE HCL 5 MG PO TABS
5.0000 mg | ORAL_TABLET | Freq: Once | ORAL | Status: AC | PRN
  Administered 2022-02-27: 5 mg via ORAL

## 2022-02-27 MED ORDER — CEFAZOLIN SODIUM-DEXTROSE 2-4 GM/100ML-% IV SOLN
2.0000 g | INTRAVENOUS | Status: AC
  Administered 2022-02-27: 2 g via INTRAVENOUS

## 2022-02-27 MED ORDER — MEPERIDINE HCL 25 MG/ML IJ SOLN
INTRAMUSCULAR | Status: AC
  Filled 2022-02-27: qty 1

## 2022-02-27 MED ORDER — FENTANYL CITRATE (PF) 100 MCG/2ML IJ SOLN
INTRAMUSCULAR | Status: DC | PRN
  Administered 2022-02-27: 25 ug via INTRAVENOUS
  Administered 2022-02-27: 50 ug via INTRAVENOUS
  Administered 2022-02-27: 25 ug via INTRAVENOUS

## 2022-02-27 MED ORDER — FENTANYL CITRATE (PF) 100 MCG/2ML IJ SOLN
25.0000 ug | INTRAMUSCULAR | Status: DC | PRN
  Administered 2022-02-27 (×2): 25 ug via INTRAVENOUS

## 2022-02-27 MED ORDER — ONDANSETRON HCL 4 MG/2ML IJ SOLN: 4.0000 mg | Freq: Once | INTRAMUSCULAR | Status: DC | PRN

## 2022-02-27 MED ORDER — MIDAZOLAM HCL 2 MG/2ML IJ SOLN
1.0000 mg | Freq: Once | INTRAMUSCULAR | Status: AC
  Administered 2022-02-27: 1 mg via INTRAVENOUS

## 2022-02-27 MED ORDER — MEPERIDINE HCL 25 MG/ML IJ SOLN
6.2500 mg | INTRAMUSCULAR | Status: DC | PRN
  Administered 2022-02-27: 6.25 mg via INTRAVENOUS

## 2022-02-27 MED ORDER — HYDROMORPHONE HCL 1 MG/ML IJ SOLN
0.5000 mg | INTRAMUSCULAR | Status: DC | PRN
  Administered 2022-02-27: 0.5 mg via INTRAVENOUS

## 2022-02-27 MED ORDER — OXYCODONE HCL 5 MG PO TABS
ORAL_TABLET | ORAL | Status: AC
  Filled 2022-02-27: qty 1

## 2022-02-27 MED ORDER — MIDAZOLAM HCL 5 MG/5ML IJ SOLN
INTRAMUSCULAR | Status: DC | PRN
  Administered 2022-02-27: 2 mg via INTRAVENOUS

## 2022-02-27 MED ORDER — OXYCODONE HCL 5 MG/5ML PO SOLN: 5.0000 mg | Freq: Once | ORAL | Status: AC | PRN

## 2022-02-27 MED ORDER — CEFAZOLIN SODIUM-DEXTROSE 2-4 GM/100ML-% IV SOLN
INTRAVENOUS | Status: AC
  Filled 2022-02-27: qty 100

## 2022-02-27 MED ORDER — LACTATED RINGERS IV SOLN: INTRAVENOUS | Status: DC

## 2022-02-27 MED ORDER — LIDOCAINE HCL (CARDIAC) PF 100 MG/5ML IV SOSY
PREFILLED_SYRINGE | INTRAVENOUS | Status: DC | PRN
  Administered 2022-02-27: 40 mg via INTRATRACHEAL

## 2022-02-27 MED ORDER — MIDAZOLAM HCL 2 MG/2ML IJ SOLN
INTRAMUSCULAR | Status: AC
  Filled 2022-02-27: qty 2

## 2022-02-27 MED ORDER — ACETAMINOPHEN 160 MG/5ML PO SOLN: 325.0000 mg | ORAL | Status: DC | PRN

## 2022-02-27 MED ORDER — ONDANSETRON HCL 4 MG/2ML IJ SOLN
INTRAMUSCULAR | Status: AC
  Filled 2022-02-27: qty 2

## 2022-02-27 SURGICAL SUPPLY — 43 items
ADH SKN CLS APL DERMABOND .7 (GAUZE/BANDAGES/DRESSINGS) ×2
APL PRP STRL LF DISP 70% ISPRP (MISCELLANEOUS) ×2
BLADE SURG 15 STRL LF DISP TIS (BLADE) ×2 IMPLANT
BLADE SURG 15 STRL SS (BLADE) ×2
CANISTER SUC SOCK COL 7IN (MISCELLANEOUS) IMPLANT
CANISTER SUCT 1200ML W/VALVE (MISCELLANEOUS) IMPLANT
CHLORAPREP W/TINT 26 (MISCELLANEOUS) ×2 IMPLANT
CLIP TI WIDE RED SMALL 6 (CLIP) IMPLANT
COVER BACK TABLE 60X90IN (DRAPES) ×2 IMPLANT
COVER MAYO STAND STRL (DRAPES) ×2 IMPLANT
COVER PROBE CYLINDRICAL 5X96 (MISCELLANEOUS) ×2 IMPLANT
DERMABOND ADVANCED .7 DNX12 (GAUZE/BANDAGES/DRESSINGS) ×2 IMPLANT
DRAPE LAPAROSCOPIC ABDOMINAL (DRAPES) ×2 IMPLANT
DRAPE UTILITY XL STRL (DRAPES) ×2 IMPLANT
ELECT COATED BLADE 2.86 ST (ELECTRODE) ×2 IMPLANT
ELECT REM PT RETURN 9FT ADLT (ELECTROSURGICAL) ×2
ELECTRODE REM PT RTRN 9FT ADLT (ELECTROSURGICAL) ×2 IMPLANT
GLOVE BIO SURGEON STRL SZ7 (GLOVE) ×4 IMPLANT
GLOVE BIOGEL PI IND STRL 7.5 (GLOVE) ×2 IMPLANT
GOWN STRL REUS W/ TWL LRG LVL3 (GOWN DISPOSABLE) ×4 IMPLANT
GOWN STRL REUS W/TWL LRG LVL3 (GOWN DISPOSABLE) ×4
HEMOSTAT ARISTA ABSORB 3G PWDR (HEMOSTASIS) IMPLANT
KIT MARKER MARGIN INK (KITS) ×2 IMPLANT
NDL HYPO 25X1 1.5 SAFETY (NEEDLE) ×2 IMPLANT
NEEDLE HYPO 25X1 1.5 SAFETY (NEEDLE) ×2 IMPLANT
NS IRRIG 1000ML POUR BTL (IV SOLUTION) IMPLANT
PACK BASIN DAY SURGERY FS (CUSTOM PROCEDURE TRAY) ×2 IMPLANT
PENCIL SMOKE EVACUATOR (MISCELLANEOUS) ×2 IMPLANT
SLEEVE SCD COMPRESS KNEE MED (STOCKING) ×2 IMPLANT
SPONGE T-LAP 4X18 ~~LOC~~+RFID (SPONGE) ×2 IMPLANT
STRIP CLOSURE SKIN 1/2X4 (GAUZE/BANDAGES/DRESSINGS) ×2 IMPLANT
SUT MNCRL AB 4-0 PS2 18 (SUTURE) ×2 IMPLANT
SUT MON AB 5-0 PS2 18 (SUTURE) IMPLANT
SUT SILK 2 0 SH (SUTURE) IMPLANT
SUT VIC AB 2-0 SH 27 (SUTURE) ×6
SUT VIC AB 2-0 SH 27XBRD (SUTURE) ×2 IMPLANT
SUT VIC AB 3-0 SH 27 (SUTURE) ×4
SUT VIC AB 3-0 SH 27X BRD (SUTURE) ×2 IMPLANT
SYR CONTROL 10ML LL (SYRINGE) ×2 IMPLANT
TOWEL GREEN STERILE FF (TOWEL DISPOSABLE) ×2 IMPLANT
TRAY FAXITRON CT DISP (TRAY / TRAY PROCEDURE) ×2 IMPLANT
TUBE CONNECTING 20X1/4 (TUBING) IMPLANT
YANKAUER SUCT BULB TIP NO VENT (SUCTIONS) IMPLANT

## 2022-02-27 NOTE — Anesthesia Postprocedure Evaluation (Signed)
Anesthesia Post Note  Patient: CAROLLYNN PENNYWELL  Procedure(s) Performed: RIGHT BREAST LUMPECTOMY WITH RADIOACTIVE SEED LOCALIZATION X3 (Right: Breast) RADIOACTIVE SEED GUIDED EXCISIONAL LEFT BREAST BIOPSY (Left: Breast)     Patient location during evaluation: PACU Anesthesia Type: General Level of consciousness: awake and alert Pain management: pain level controlled Vital Signs Assessment: post-procedure vital signs reviewed and stable Respiratory status: spontaneous breathing, nonlabored ventilation, respiratory function stable and patient connected to nasal cannula oxygen Cardiovascular status: blood pressure returned to baseline and stable Postop Assessment: no apparent nausea or vomiting Anesthetic complications: no   No notable events documented.  Last Vitals:  Vitals:   02/27/22 1409 02/27/22 1415  BP:  116/80  Pulse: 84 85  Resp: 12 11  Temp:    SpO2: 100% 97%    Last Pain:  Vitals:   02/27/22 1415  TempSrc:   PainSc: 7                  Teagon Kron

## 2022-02-27 NOTE — Discharge Instructions (Addendum)
[place] Office Phone Number [phone]  POST OP INSTRUCTIONS Take 400 mg of ibuprofen every 8 hours or 650 mg tylenol every 6 hours for next 72 hours then as needed. Use ice several times daily also.  A prescription for pain medication may be given to you upon discharge.  Take your pain medication as prescribed, if needed.  If narcotic pain medicine is not needed, then you may take acetaminophen (Tylenol), naprosyn (Alleve) or ibuprofen (Advil) as needed. Take your usually prescribed medications unless otherwise directed If you need a refill on your pain medication, please contact your pharmacy.  They will contact our office to request authorization.  Prescriptions will not be filled after 5pm or on week-ends. You should eat very light the first 24 hours after surgery, such as soup, crackers, pudding, etc.  Resume your normal diet the day after surgery. Most patients will experience some swelling and bruising in the breast.  Ice packs and a good support bra will help.  Wear the breast binder provided or a sports bra for 72 hours day and night.  After that wear a sports bra during the day until you return to the office. Swelling and bruising can take several days to resolve.  It is common to experience some constipation if taking pain medication after surgery.  Increasing fluid intake and taking a stool softener will usually help or prevent this problem from occurring.  A mild laxative (Milk of Magnesia or Miralax) should be taken according to package directions if there are no bowel movements after 48 hours. I used skin glue on the incision, you may shower in 24 hours.  The glue will flake off over the next 2-3 weeks.  Any sutures or staples will be removed at the office during your follow-up visit. ACTIVITIES:  You may resume regular daily activities (gradually increasing) beginning the next day.  Wearing a good support bra or sports bra minimizes pain and swelling.  You may have  sexual intercourse when it is comfortable. You may drive when you no longer are taking prescription pain medication, you can comfortably wear a seatbelt, and you can safely maneuver your car and apply brakes. RETURN TO WORK:  ______________________________________________________________________________________ Dennis Bast should see your doctor in the office for a follow-up appointment approximately two weeks after your surgery.  Your doctor's nurse will typically make your follow-up appointment when she calls you with your pathology report.  Expect your pathology report 3-4 business days after your surgery.  You may call to check if you do not hear from [place] after three days. OTHER INSTRUCTIONS: _______________________________________________________________________________________________ _____________________________________________________________________________________________________________________________________ _____________________________________________________________________________________________________________________________________ _____________________________________________________________________________________________________________________________________  WHEN TO CALL DR WAKEFIELD: Fever over 101.0 Nausea and/or vomiting. Extreme swelling or bruising. Continued bleeding from incision. Increased pain, redness, or drainage from the incision.  The clinic staff is available to answer your questions during regular business hours.  Please don't hesitate to call and ask to speak to one of the nurses for clinical concerns.  If you have a medical emergency, go to the nearest emergency room or call 911.  A surgeon from [facility] is always on call at the hospital.  For further questions, please visit [email or web address]   Post Anesthesia Home Care Instructions  Activity: Get plenty of rest for the remainder of the day. A responsible individual must stay  with you for 24 hours following the procedure.  For the next 24 hours, DO NOT: -Drive a car -Paediatric nurse -Drink alcoholic beverages -Take any medication unless instructed by  your physician -Make any legal decisions or sign important papers.  Meals: Start with liquid foods such as gelatin or soup. Progress to regular foods as tolerated. Avoid greasy, spicy, heavy foods. If nausea and/or vomiting occur, drink only clear liquids until the nausea and/or vomiting subsides. Call your physician if vomiting continues.  Special Instructions/Symptoms: Your throat may feel dry or sore from the anesthesia or the breathing tube placed in your throat during surgery. If this causes discomfort, gargle with warm salt water. The discomfort should disappear within 24 hours.  Next dose of Tylenol can be taken at 430pm today if needed.

## 2022-02-27 NOTE — Anesthesia Procedure Notes (Signed)
Procedure Name: LMA Insertion Date/Time: 02/27/2022 12:11 PM  Performed by: Glory Buff, CRNAPre-anesthesia Checklist: Patient identified, Emergency Drugs available, Suction available and Patient being monitored Patient Re-evaluated:Patient Re-evaluated prior to induction Oxygen Delivery Method: Circle system utilized Preoxygenation: Pre-oxygenation with 100% oxygen Induction Type: IV induction LMA: LMA inserted LMA Size: 4.0 Number of attempts: 1 Placement Confirmation: positive ETCO2 Tube secured with: Tape Dental Injury: Teeth and Oropharynx as per pre-operative assessment

## 2022-02-27 NOTE — Interval H&P Note (Signed)
History and Physical Interval Note:  02/27/2022 11:15 AM  Christie Fox  has presented today for surgery, with the diagnosis of RIGHT BREAST DCIS, LEFT BREAST MASSES.  The various methods of treatment have been discussed with the patient and family. After consideration of risks, benefits and other options for treatment, the patient has consented to  Procedure(s): RIGHT BREAST LUMPECTOMY WITH RADIOACTIVE SEED LOCALIZATION X2 (Right) RADIOACTIVE SEED GUIDED EXCISIONAL LEFT BREAST BIOPSY X2 (Left) as a surgical intervention.  The patient's history has been reviewed, patient examined, no change in status, stable for surgery.  I have reviewed the patient's chart and labs.  Questions were answered to the patient's satisfaction.     Rolm Bookbinder

## 2022-02-27 NOTE — Op Note (Signed)
Preoperative diagnosis: Left breast benign lesion on core biopsy Right breast DCIS times two Postoperative diagnosis: same as above Procedure: 1.  Left breast seed guided excisional biopsy 2.  Right medial seed guided lumpectomy 3.  Right lateral bracketed lumpectomy with seed guidance Surgeon: Dr. Serita Grammes Anesthesia: General Estimated blood loss: Minimal Complications: None Drains: None Specimens: 1.  Left breast excisional biopsy with seed and clip present on mammography 2.  Right medial lumpectomy containing seed and clip on mammography 3.  Additional right medial posterior margin marked short superior, long lateral, double deep 4.  Right lateral seed bracketed lumpectomy with seeds and clips present on mammography 5.  Additional medial, superior, inferior margins of the right lateral lumpectomy marked short superior, long lateral, double deep Sponge needle count was correct at completion Disposition to recovery stable condition  Indications:66 yof screening mm who has focal asymmetry in the ruoq and segmental calcifications in the right lower UMQ. She has c density breast tissue. The mass is 1.5x0.9x0.8 cm and the calcs measure 2.8x3x1.4 cm. Axillary [place] was negative.  Both were biopsied and clips were placed. Path shows int grade DCIS that is 100% er pos and 60% pr positive. she then underwent an MRI. This showed in [place] an area of NME at biopsy site that was 1.6x2.9 cm. There was clip artifact at inner portion of right breast at site of biopsy. On the left side there was NME that measured 2.1x6.8 cm in size. She had additional biopsy of medial extension of RUOQ that is DCIS also. Biopsy of the two left sided lesions are benign- have csl, papilloma and radial scar present.  We discussed an excisional biopsy on the left as well as an attempt at breast conservation therapy with 2 lumpectomies on the right.  Procedure: After informed consent was obtained the patient was taken to the  operating room.  I had her mammograms available for my review in the operating room.  She was given antibiotics.  SCDs were placed.  She was then placed under general anesthesia without complication.  She was prepped and draped in the standard sterile surgical fashion.  A surgical timeout was then performed.  I did the left side first.  I made a curvilinear incision after infiltrating Marcaine.  I then dissected down to the seed and using the neoprobe for guidance remove the seed and some of the surrounding tissue.  This was very thick and I remove this tissue.  This was marked with paint and I did a mammogram which confirmed removal of the seed and the clip.  The seed was eccentric in the specimen but due to its benign nature I did not remove any more tissue.  I then obtained hemostasis.  I closed the breast tissue with 2-0 Vicryl.  The skin was closed with 3-0 Vicryl for Monocryl.  Glue and Steri-Strips were eventually applied.  I then did the right side.  I did the right medial lumpectomy first.  I filtrated Marcaine and made a periareolar incision.  I then dissected to the seed.  I remove the seed in the surrounding tissue.  Mammogram confirmed removal of the seed as well as the calcifications which were visualized.  These look close to the posterior margin so I excised this and passed this off the table.  I then obtained hemostasis.  This was closed with 3-0 Vicryl, 5-0 Monocryl and glue.  The right upper outer quadrant lesion I made a curvilinear incision between the 2 seeds.  I  then used the neoprobe to guide excision of the 2 seeds and the intervening tissue.  I confirm removal of the clips and the seeds with mammography.  I then thought I was close to 3 margins which I excised.  I then placed some clips in this cavity.  I obtained hemostasis.  I pulled the breast tissue together with 2-0 Vicryl.  The skin was closed with 3-0 Vicryl for Monocryl.  Glue Steri-Strips were applied.  She tolerated this  well was extubated and transferred recovery stable.

## 2022-02-27 NOTE — Transfer of Care (Signed)
Immediate Anesthesia Transfer of Care Note  Patient: Christie Fox  Procedure(s) Performed: RIGHT BREAST LUMPECTOMY WITH RADIOACTIVE SEED LOCALIZATION X3 (Right: Breast) RADIOACTIVE SEED GUIDED EXCISIONAL LEFT BREAST BIOPSY (Left: Breast)  Patient Location: PACU  Anesthesia Type:General  Level of Consciousness: drowsy, patient cooperative, and responds to stimulation  Airway & Oxygen Therapy: Patient Spontanous Breathing and Patient connected to face mask oxygen  Post-op Assessment: Report given to RN and Post -op Vital signs reviewed and stable  Post vital signs: Reviewed and stable  Last Vitals:  Vitals Value Taken Time  BP    Temp    Pulse 86 02/27/22 1353  Resp 18 02/27/22 1353  SpO2 98 % 02/27/22 1353  Vitals shown include unvalidated device data.  Last Pain:  Vitals:   02/27/22 1026  TempSrc: Oral  PainSc: 0-No pain      Patients Stated Pain Goal: 7 (83/66/29 4765)  Complications: No notable events documented.

## 2022-02-27 NOTE — Anesthesia Preprocedure Evaluation (Addendum)
Anesthesia Evaluation  Patient identified by MRN, date of birth, ID band Patient awake    Reviewed: Allergy & Precautions, H&P , NPO status , Patient's Chart, lab work & pertinent test results  History of Anesthesia Complications (+) PONV and history of anesthetic complications  Airway Mallampati: II   Neck ROM: full    Dental   Pulmonary neg pulmonary ROS   breath sounds clear to auscultation       Cardiovascular hypertension, Pt. on medications  Rhythm:regular Rate:Normal     Neuro/Psych  Headaches  Anxiety        GI/Hepatic   Endo/Other  Hypothyroidism  Morbid obesity  Renal/GU      Musculoskeletal  (+) Arthritis ,    Abdominal  (+) + obese  Peds  Hematology   Anesthesia Other Findings   Reproductive/Obstetrics                             Anesthesia Physical Anesthesia Plan  ASA: 3  Anesthesia Plan: General   Post-op Pain Management: Tylenol PO (pre-op)* and Celebrex PO (pre-op)*   Induction: Intravenous  PONV Risk Score and Plan: 4 or greater and Ondansetron, Dexamethasone, Midazolam, Scopolamine patch - Pre-op, Treatment may vary due to age or medical condition and Propofol infusion  Airway Management Planned: Oral ETT and LMA  Additional Equipment: None  Intra-op Plan:   Post-operative Plan: Extubation in OR  Informed Consent: I have reviewed the patients History and Physical, chart, labs and discussed the procedure including the risks, benefits and alternatives for the proposed anesthesia with the patient or authorized representative who has indicated his/her understanding and acceptance.       Plan Discussed with: CRNA, Anesthesiologist and Surgeon  Anesthesia Plan Comments:         Anesthesia Quick Evaluation

## 2022-02-27 NOTE — H&P (Signed)
67 yof screening mm who has focal asymmetry in the ruoq and segmental calcifications in the right lower UMQ. FH in mom at age 67. She has c density breast tissue. The mass is 1.5x0.9x0.8 cm and the calcs measure 2.8x3x1.4 cm. Axillary [place] was negative. She has no prior history. Both were biopsied and clips were placed. Path shows int grade DCIS that is 100% er pos and 60% pr positive. she then underwent an MRI. This showed in [place] an area of NME at biopsy site that was 1.6x2.9 cm. There was clip artifact at inner portion of right breast at site of biopsy. On the left side there was NME that measured 2.1x6.8 cm in size. She had additional biopsy of medial extension of RUOQ that is DCIS also. Biopsy of the two left sided lesions are benign- have csl, papilloma and radial scar present. She has a fairly large hematoma on left but otherwise fine  Review of Systems: A complete review of systems was obtained from the patient. I have reviewed this information and discussed as appropriate with the patient. See HPI as well for other ROS.  Review of Systems All other systems reviewed and are negative.   Medical History: Past Medical History: Diagnosis Date Anxiety Arthritis History of cancer Hyperlipidemia Hypertension Thyroid disease  Patient Active Problem List Diagnosis Ductal carcinoma in situ (DCIS) of right breast  Past Surgical History: Procedure Laterality Date CYSTOSCOPY gastric sleeve HYSTERECTOMY   Allergies Allergen Reactions Sumatriptan Other (See Comments), Shortness Of Breath and Swelling Choking Chlorpromazine Other (See Comments) Unknown reaction type  Unknown Oxycodone Other (See Comments) and Nausea And Vomiting  Current Outpatient Medications on File Prior to Visit Medication Sig Dispense Refill ALPRAZolam (XANAX) 1 MG tablet Take 1 mg by mouth 3 (three) times daily as needed buPROPion (WELLBUTRIN XL) 300 MG XL tablet Take 1 tablet by mouth once daily FUROsemide  (LASIX) 40 MG tablet TAKE 1 TABLET(40 MG) BY MOUTH TWICE DAILY AS NEEDED levothyroxine (SYNTHROID) 100 MCG tablet Take by mouth perindopril (ACEON) 4 MG tablet Take 1 tablet by mouth once daily rosuvastatin (CRESTOR) 10 MG tablet Take 1 tablet by mouth once daily  No current facility-administered medications on file prior to visit.  Family History Problem Relation Age of Onset Obesity Mother High blood pressure (Hypertension) Mother Hyperlipidemia (Elevated cholesterol) Mother Breast cancer Mother Myocardial Infarction (Heart attack) Mother Obesity Father   Social History  Tobacco Use Smoking Status Never Smokeless Tobacco Never   Social History  Socioeconomic History Marital status: Married Tobacco Use Smoking status: Never Smokeless tobacco: Never  Objective:  Physical Exam Vitals reviewed. Constitutional: Appearance: Normal appearance. Chest: Breasts: Right: No inverted nipple, mass or nipple discharge. Left: No inverted nipple, mass or nipple discharge. Comments: Left breast hematoma Lymphadenopathy: Upper Body: Right upper body: No supraclavicular or axillary adenopathy. Left upper body: No supraclavicular or axillary adenopathy. Neurological: Mental Status: She is alert.    Assessment and Plan:  Ductal carcinoma in situ (DCIS) of right breast   Right breast seed guided lumpectomy times two Left breast seed guided excisional biopsy  We discussed left side with option to follow with six month MR or do excisional biopsies. She would prefer to proceed with excisions. We discussed doing these with seed guidance. We again discussed lumpectomy vs mastectomy on right.she is motivated to try to do double lumpectomy. I think this may flatten inner portion of breast a little but technically possible. Discussed 10% risk of positive margins and even possibly needing to do  mastectomy still. Will schedule in two weeks once hematoma has resolved some.

## 2022-02-28 ENCOUNTER — Encounter (HOSPITAL_BASED_OUTPATIENT_CLINIC_OR_DEPARTMENT_OTHER): Payer: Self-pay | Admitting: General Surgery

## 2022-03-07 ENCOUNTER — Encounter: Payer: Self-pay | Admitting: *Deleted

## 2022-03-07 LAB — SURGICAL PATHOLOGY

## 2022-03-08 ENCOUNTER — Encounter: Payer: Self-pay | Admitting: *Deleted

## 2022-03-08 ENCOUNTER — Telehealth: Payer: Self-pay | Admitting: *Deleted

## 2022-03-08 DIAGNOSIS — C50911 Malignant neoplasm of unspecified site of right female breast: Secondary | ICD-10-CM | POA: Insufficient documentation

## 2022-03-08 NOTE — Telephone Encounter (Signed)
Ordered oncotype per Dr. Gudena.  Requisition sent to pathology and exact sciences. 

## 2022-03-10 NOTE — Progress Notes (Signed)
Patient Care Team: Dewayne Shorter, PA-C as PCP - General (Physician Assistant) Mauro Kaufmann, RN as Oncology Nurse Navigator Rockwell Germany, RN as Oncology Nurse Navigator Nicholas Lose, MD as Consulting Physician (Hematology and Oncology) Rolm Bookbinder, MD as Consulting Physician (General Surgery) Kyung Rudd, MD as Consulting Physician (Radiation Oncology)  DIAGNOSIS: No diagnosis found.  SUMMARY OF ONCOLOGIC HISTORY: Oncology History  Ductal carcinoma in situ (DCIS) of right breast  12/27/2021 Initial Diagnosis   Screening detected right breast asymmetry upper outer quadrant: 1.5 cm, right medial quadrant calcifications spanning 3 cm, axilla negative, biopsy of both revealed intermediate grade DCIS involving CSL ER 100%, PR 60%   01/04/2022 Cancer Staging   Staging form: Breast, AJCC 8th Edition - Clinical stage from 01/04/2022: Stage 0 (cTis (DCIS), cN0, cM0, G2, ER+, PR+, HER2: Not Assessed) - Signed by Nicholas Lose, MD on 01/04/2022 Stage prefix: Initial diagnosis Histologic grading system: 3 grade system     CHIEF COMPLIANT:  right breast DCIS/follow up after surgery  INTERVAL HISTORY: Christie Fox is a  67 y.o. female is here because of recent diagnosis of right breast DCIS. She presents to the clinic for a follow-up after surgery.   ALLERGIES:  is allergic to imitrex [sumatriptan], butorphanol, chlorpromazine, etodolac, and oxycodone.  MEDICATIONS:  Current Outpatient Medications  Medication Sig Dispense Refill   ALPRAZolam (XANAX) 1 MG tablet Take 1 mg by mouth 3 (three) times daily as needed for anxiety.     Brimonidine Tartrate (LUMIFY) 0.025 % SOLN Place 1 drop into both eyes daily as needed (for irritated/dry eyes.). LUMIFY     furosemide (LASIX) 40 MG tablet Take 40 mg by mouth daily.     hydroxypropyl methylcellulose / hypromellose (ISOPTO TEARS / GONIOVISC) 2.5 % ophthalmic solution Place 1 drop into both eyes 3 (three) times daily as needed  for dry eyes. INSTANT TEARS     levothyroxine (SYNTHROID, LEVOTHROID) 100 MCG tablet Take 100 mcg by mouth daily before breakfast.     perindopril (ACEON) 4 MG tablet Take 4 mg by mouth daily.      rosuvastatin (CRESTOR) 10 MG tablet Take 10 mg by mouth daily at 6 PM. (1700)     traMADol (ULTRAM) 50 MG tablet Take 1 tablet (50 mg total) by mouth every 6 (six) hours as needed. 10 tablet 0   No current facility-administered medications for this visit.    PHYSICAL EXAMINATION: ECOG PERFORMANCE STATUS: {CHL ONC ECOG [id number]}  There were no vitals filed for this visit. There were no vitals filed for this visit.  BREAST:*** No palpable masses or nodules in either right or left breasts. No palpable axillary supraclavicular or infraclavicular adenopathy no breast tenderness or nipple discharge. (exam performed in the presence of a chaperone)  LABORATORY DATA:  I have reviewed the data as listed    Latest Ref Rng & Units 02/24/2022    9:46 AM 01/04/2022    8:06 AM 03/20/2017   10:57 AM  CMP  Glucose 70 - 99 mg/dL 123  103  107   BUN 8 - 23 mg/dL 18  19  17   $ Creatinine 0.44 - 1.00 mg/dL 0.99  0.98  0.76   Sodium 135 - 145 mmol/L 134  140  141   Potassium 3.5 - 5.1 mmol/L 3.4  3.1  4.4   Chloride 98 - 111 mmol/L 94  97  104   CO2 22 - 32 mmol/L 30  35  25   Calcium 8.9 -  10.3 mg/dL 9.5  10.1  9.6   Total Protein 6.5 - 8.1 g/dL  7.3  8.0   Total Bilirubin 0.3 - 1.2 mg/dL  0.7  0.6   Alkaline Phos 38 - 126 U/L  77  59   AST 15 - 41 U/L  18  32   ALT 0 - 44 U/L  12  27     Lab Results  Component Value Date   WBC 7.8 01/04/2022   HGB 13.6 01/04/2022   HCT 40.9 01/04/2022   MCV 92.5 01/04/2022   PLT 352 01/04/2022   NEUTROABS 5.4 01/04/2022    ASSESSMENT & PLAN:  No problem-specific Assessment & Plan notes found for this encounter.    No orders of the defined types were placed in this encounter.  The patient has a good understanding of the overall plan. she agrees with  it. she will call with any problems that may develop before the next visit here. Total time spent: 30 mins including face to face time and time spent for planning, charting and co-ordination of care   Suzzette Righter, [place] 03/10/22    I Gardiner Coins am acting as a Education administrator for [employer]  ***

## 2022-03-13 ENCOUNTER — Inpatient Hospital Stay: Payer: Medicare Other | Attending: Hematology and Oncology | Admitting: Hematology and Oncology

## 2022-03-13 VITALS — BP 118/88 | HR 90 | Temp 97.9°F | Resp 18 | Wt 212.2 lb

## 2022-03-13 DIAGNOSIS — Z79899 Other long term (current) drug therapy: Secondary | ICD-10-CM | POA: Diagnosis not present

## 2022-03-13 DIAGNOSIS — Z923 Personal history of irradiation: Secondary | ICD-10-CM | POA: Insufficient documentation

## 2022-03-13 DIAGNOSIS — D0511 Intraductal carcinoma in situ of right breast: Secondary | ICD-10-CM | POA: Diagnosis present

## 2022-03-13 NOTE — Assessment & Plan Note (Addendum)
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 Right lateral lumpectomy: DCIS grade 2, incidental grade 1 IDC 1 cm, additional grade 1 IDC 4 mm, margins negative, ER 90%, PR 10%, Ki-67 1%, HER2 negative 1+  Pathology counseling: I discussed the final pathology report of the patient provided  a copy of this report. I discussed the margins as well as lymph node surgeries. We also discussed the final staging along with ER/PR and HER-2/neu testing.  Treatment plan: Oncotype DX to determine prognosis and benefit for chemo Adjuvant radiation Adjuvant antiestrogen therapy  Return to clinic based upon Oncotype DX test result

## 2022-03-20 ENCOUNTER — Encounter (HOSPITAL_COMMUNITY): Payer: Self-pay

## 2022-03-21 ENCOUNTER — Telehealth: Payer: Self-pay | Admitting: *Deleted

## 2022-03-21 ENCOUNTER — Encounter: Payer: Self-pay | Admitting: *Deleted

## 2022-03-21 ENCOUNTER — Encounter (HOSPITAL_COMMUNITY): Payer: Self-pay

## 2022-03-21 NOTE — Telephone Encounter (Signed)
Received oncoytpe results of 22/8%. Patient aware. She is scheduled to see Dr.Moody 2/27.

## 2022-03-27 NOTE — Progress Notes (Signed)
Radiation Oncology         [phone] ________________________________  Name: Christie Fox        MRN: [id number]  Date of Service: 03/28/2022 DOB: May 12, 1955  [id number], Christie Neat, MD     REFERRING PHYSICIAN: Nicholas Lose, MD   DIAGNOSIS: The encounter diagnosis was Malignant neoplasm of upper-outer quadrant of right breast in female, estrogen receptor positive ([place]).   HISTORY OF PRESENT ILLNESS: Christie Fox is a 67 y.o. female originally seen in the multidisciplinary breast clinic for a new diagnosis of right breast cancer. The patient was noted to have right screening detected asymmetry in the 10 to 11 o'clock position of the right breast.  By ultrasound there was a 1.5 cm area 1 o'clock position consistent with masslike features. No abnormalities were seen sonographically in her right axilla.  She underwent biopsies on 12/27/2021 that showed intermediate grade DCIS, the 1030 specimen partially involved a complex sclerosing lesion.  Calcifications and necrosis were seen in both biopsy specimens, and the cancer was ER/PR positive in both specimens as well.    Since her last visit, the patient and additional imaging and MRI of the breasts on 01/11/2022, this showed her known area of prior biopsy and breast cancer in the right breast as well as non-mass enhancement extending 2.9 cm.  There is also an index determinant area in the midportion of the left breast measuring 2.1 x 6.8 cm.  She underwent additional biopsies on 02/02/2020 for the additional right breast biopsy showed intermediate grade DCIS with necrosis present 2 biopsies of her left breast showed benign breast tissue in the anterior depth specimen with a small intraductal papilloma and usual ductal hyperplasia, middle depth/with a dumbbell clip showed complex sclerosing lesion usual ductal hyperplasia.  She proceeded with bilateral lumpectomy and right sentinel lymph node biopsy on 02/27/2022.  Final  pathology in the left showed a complex sclerosing lesion completely excised with fibrocystic change and 1 incidental benign node in the specimen her right breast lumpectomy showed intermediate grade DCIS with intraductal papilloma abutting the black inked posterior margin negative by its new margin, and low lateral lumbar the specimen showed a 10 mm and 4 mm area of grade 1 invasive ductal carcinoma margins were clear with the closest being more than 10 mm to the lateral margin for invasive disease, and 12 mm for in situ disease in the medial specimen. No nodes were sampled. Oncotype score was 22 and no chemotherapy is planned. No additional nodal surgery is planned. She's seen today to discuss adjuvant radiation.    PREVIOUS RADIATION THERAPY: No   PAST MEDICAL HISTORY:  Past Medical History:  Diagnosis Date   Anxiety    Arthritis    knees   At risk for sleep apnea    STOP-BANG= 4    SENT TO PCP 07-31-2013   Borderline diabetes    diet controlled   Breast cancer ([place])    Dysuria    History of kidney stones    Hyperlipidemia    Hypertension    Hypothyroidism    Migraines    PONV (postoperative nausea and vomiting)    "please call me Aitanna to wake me up , not [name]"    Renal calculus, left    Urgency of urination        PAST SURGICAL HISTORY: Past Surgical History:  Procedure Laterality Date   BREAST LUMPECTOMY WITH RADIOACTIVE SEED LOCALIZATION Right 02/27/2022   Procedure: RIGHT BREAST LUMPECTOMY  WITH RADIOACTIVE SEED LOCALIZATION X3;  Surgeon: Rolm Bookbinder, MD;  Location: [facility];  Service: General;  Laterality: Right;   CESAREAN SECTION  x2   CYSTOSCOPY WITH RETROGRADE PYELOGRAM, URETEROSCOPY AND STENT PLACEMENT Left 08/04/2013   Procedure: 1ST STAGE CYSTOSCOPY WITH RETROGRADE PYELOGRAM, URETEROSCOPY AND STENT PLACEMENT;  Surgeon: Alexis Frock, MD;  Location: [facility];  Service: Urology;  Laterality: Left;   CYSTOSCOPY WITH RETROGRADE PYELOGRAM,  URETEROSCOPY AND STENT PLACEMENT Left 08/20/2013   Procedure: 2ND STAGE CYSTOSCOPY WITH RETROGRADE PYELOGRAM, URETEROSCOPY BASKET STONES AND STENT EXCHANGE, BLOOD MOP;  Surgeon: Alexis Frock, MD;  Location[facility];  Service: Urology;  Laterality: Left;   EXPLORATORY LAPAROTOMY W/ BILATERAL SALPINOOPHORECTOMY  11-27-2001   HOLMIUM LASER APPLICATION Left [id number]   Procedure: HOLMIUM LASER APPLICATION;  Surgeon: Alexis Frock, MD;  Location: [facility];  Service: Urology;  Laterality: Left;   LAPAROSCOPIC GASTRIC SLEEVE RESECTION N/A 03/27/2017   Procedure: LAPAROSCOPIC GASTRIC SLEEVE RESECTION WITH UPPER ENDO;  Surgeon: Alphonsa Overall, MD;  Location: [facility];  Service: General;  Laterality: N/A;   LEFT URETEROSCOPIC LASER LITHO STONE EXTRACTION AND STENT PLACEMENT  03-25-2005   LUMBAR FUSION  X2   LAST ONE 2000   ORIF LEFT ANKLE FX  03-20-2000   RETAINED HARDWARE   RADIOACTIVE SEED GUIDED EXCISIONAL BREAST BIOPSY Left 02/27/2022   Procedure: RADIOACTIVE SEED GUIDED EXCISIONAL LEFT BREAST BIOPSY;  Surgeon: Rolm Bookbinder, MD;  Location: [facility];  Service: General;  Laterality: Left;   VAGINAL HYSTERECTOMY  1999   WISDOM TOOTH EXTRACTION       FAMILY HISTORY:  Family History  Problem Relation Age of Onset   Breast cancer Mother 44     SOCIAL HISTORY:  reports that she has never smoked. She has never used smokeless tobacco. She reports current alcohol use. She reports that she does not use drugs.  The patient is married and lives in [place].  She enjoys spending time with her grandchildren, and going to the salon.    ALLERGIES: Patient has no active allergies.   MEDICATIONS:  Current Outpatient Medications  Medication Sig Dispense Refill   ALPRAZolam (XANAX) 1 MG tablet Take 1 mg by mouth 3 (three) times daily as needed for anxiety.     Brimonidine Tartrate (LUMIFY) 0.025 % SOLN Place 1 drop into both eyes daily as needed (for irritated/dry  eyes.). LUMIFY     buPROPion (WELLBUTRIN XL) 300 MG 24 hr tablet Take 300 mg by mouth daily.     furosemide (LASIX) 40 MG tablet Take 40 mg by mouth daily.     hydroxypropyl methylcellulose / hypromellose (ISOPTO TEARS / GONIOVISC) 2.5 % ophthalmic solution Place 1 drop into both eyes 3 (three) times daily as needed for dry eyes. INSTANT TEARS     levothyroxine (SYNTHROID, LEVOTHROID) 100 MCG tablet Take 100 mcg by mouth daily before breakfast.     perindopril (ACEON) 4 MG tablet Take 4 mg by mouth daily.      rosuvastatin (CRESTOR) 10 MG tablet Take 10 mg by mouth daily at 6 PM. (1700)     traMADol (ULTRAM) 50 MG tablet Take 1 tablet (50 mg total) by mouth every 6 (six) hours as needed. 10 tablet 0   No current facility-administered medications for this visit.     REVIEW OF SYSTEMS: On review of systems, the patient reports that she is doing***     PHYSICAL EXAM:  Wt Readings from Last 3 Encounters:  03/13/22 212 lb  3 oz (96.2 kg)  02/27/22 216 lb 4.3 oz (98.1 kg)  01/04/22 216 lb 1.6 oz (98 kg)   Temp Readings from Last 3 Encounters:  03/13/22 97.9 F (36.6 C) (Temporal)  02/27/22 97.6 F (36.4 C)  01/04/22 (!) 97.5 F (36.4 C) (Temporal)   BP Readings from Last 3 Encounters:  03/13/22 118/88  02/27/22 131/76  01/04/22 115/84   Pulse Readings from Last 3 Encounters:  03/13/22 90  02/27/22 91  01/04/22 79    In general this is a well appearing. Caucasian female in no acute distress. She's alert and oriented x4 and appropriate throughout the examination. Cardiopulmonary assessment is negative for acute distress and she exhibits normal effort. Bilateral breast incisions are well healed without erythema, separation, or drainage.     ECOG = 0  0 - Asymptomatic (Fully active, able to carry on all predisease activities without restriction)  1 - Symptomatic but completely ambulatory (Restricted in physically strenuous activity but ambulatory and able to carry out work of a  light or sedentary nature. For example, light housework, office work)  2 - Symptomatic, <50% in bed during the day (Ambulatory and capable of all self care but unable to carry out any work activities. Up and about more than 50% of waking hours)  3 - Symptomatic, >50% in bed, but not bedbound (Capable of only limited self-care, confined to bed or chair 50% or more of waking hours)  4 - Bedbound (Completely disabled. Cannot carry on any self-care. Totally confined to bed or chair)  5 - Death   Eustace Pen MM, Creech RH, Tormey DC, et al. [phone]). "Toxicity and response criteria of the [facility]". [place] Oncol. 5 (6): 649-55    LABORATORY DATA:  Lab Results  Component Value Date   WBC 7.8 01/04/2022   HGB 13.6 01/04/2022   HCT 40.9 01/04/2022   MCV 92.5 01/04/2022   PLT 352 01/04/2022   Lab Results  Component Value Date   NA 134 (L) 02/24/2022   K 3.4 (L) 02/24/2022   CL 94 (L) 02/24/2022   CO2 30 02/24/2022   Lab Results  Component Value Date   ALT 12 01/04/2022   AST 18 01/04/2022   ALKPHOS 77 01/04/2022   BILITOT 0.7 01/04/2022      RADIOGRAPHY: No results found.     IMPRESSION/PLAN: 1. Stage IA, pT1b, cN0M0, grade 1, ER/PR positive invasive ductal carcinoma of the right breast. Dr. Lisbeth Renshaw has reviewed her final pathology findings and I discussed the nature of early stage right breast disease. She's done well since surgery despite the change in her classification in her diagnosis. She does not need chemotherapy, and is now ready to consider external radiotherapy to the breast  to reduce risks of local recurrence followed by antiestrogen therapy. We discussed the risks, benefits, short, and long term effects of radiotherapy, as well as the curative intent, and the patient is interested in proceeding. I discussed the delivery and logistics of radiotherapy and Dr. Lisbeth Renshaw recommends 4 weeks of radiotherapy to the right breast. Written consent is obtained  and placed in the chart, a copy was provided to the patient. She will simulate today.     In a visit lasting *** minutes, greater than 50% of the time was spent face to face reviewing her case, as well as in preparation of, discussing, and coordinating the patient's care.      Carola Rhine, [facility]    **Disclaimer: This note was  dictated with voice recognition software. Similar sounding words can inadvertently be transcribed and this note may contain transcription errors which may not have been corrected upon publication of note.**

## 2022-03-28 ENCOUNTER — Other Ambulatory Visit: Payer: Self-pay

## 2022-03-28 ENCOUNTER — Encounter: Payer: Self-pay | Admitting: Radiation Oncology

## 2022-03-28 ENCOUNTER — Ambulatory Visit
Admission: RE | Admit: 2022-03-28 | Discharge: 2022-03-28 | Disposition: A | Discharge status: Home / Self Care | Payer: Medicare Other | Source: Ambulatory Visit | Attending: Radiation Oncology | Admitting: Radiation Oncology

## 2022-03-28 VITALS — BP 132/77 | HR 82 | Temp 96.9°F | Resp 18 | Ht 62.0 in | Wt 212.1 lb

## 2022-03-28 DIAGNOSIS — Z170 Estrogen receptor positive status [ER+]: Secondary | ICD-10-CM | POA: Insufficient documentation

## 2022-03-28 DIAGNOSIS — C50411 Malignant neoplasm of upper-outer quadrant of right female breast: Secondary | ICD-10-CM | POA: Diagnosis present

## 2022-03-28 NOTE — Progress Notes (Signed)
Nursing interview for Malignant neoplasm of upper-outer quadrant of right breast in female, estrogen receptor positive ([place]).  Patient identity verified. Patient having high anxiety about [place] but is otherwise doing well.  Meaningful use complete. Hysterectomy  BP 132/77 (BP Location: Left Arm, Patient Position: Sitting, Cuff Size: Normal)   Pulse 82   Temp (!) 96.9 F (36.1 C) (Temporal)   Resp 18   Ht '5\' 2"'$  (1.575 m)   Wt 212 lb 2 oz (96.2 kg)   SpO2 100%   BMI 38.80 kg/m   This concludes the interview.   Leandra Kern, LPN

## 2022-04-04 ENCOUNTER — Telehealth: Payer: Self-pay | Admitting: Physician Assistant

## 2022-04-04 ENCOUNTER — Encounter: Payer: Self-pay | Admitting: *Deleted

## 2022-04-04 NOTE — Telephone Encounter (Signed)
Per 3/5 IB reached out to patient to schedule. Patient aware of date and time of appointment.

## 2022-04-10 DIAGNOSIS — Z510 Encounter for antineoplastic radiation therapy: Secondary | ICD-10-CM | POA: Insufficient documentation

## 2022-04-10 DIAGNOSIS — Z170 Estrogen receptor positive status [ER+]: Secondary | ICD-10-CM | POA: Insufficient documentation

## 2022-04-10 DIAGNOSIS — C50411 Malignant neoplasm of upper-outer quadrant of right female breast: Secondary | ICD-10-CM | POA: Diagnosis present

## 2022-04-11 ENCOUNTER — Other Ambulatory Visit: Payer: Self-pay

## 2022-04-11 ENCOUNTER — Ambulatory Visit
Admission: RE | Admit: 2022-04-11 | Discharge: 2022-04-11 | Disposition: A | Discharge status: Home / Self Care | Payer: Medicare Other | Source: Ambulatory Visit | Attending: Radiation Oncology | Admitting: Radiation Oncology

## 2022-04-11 DIAGNOSIS — Z510 Encounter for antineoplastic radiation therapy: Secondary | ICD-10-CM | POA: Diagnosis not present

## 2022-04-11 LAB — RAD ONC ARIA SESSION SUMMARY
Course Elapsed Days: 0
Plan Fractions Treated to Date: 1
Plan Prescribed Dose Per Fraction: 2.66 Gy
Plan Total Fractions Prescribed: 16
Plan Total Prescribed Dose: 42.56 Gy
Reference Point Dosage Given to Date: 2.66 Gy
Reference Point Session Dosage Given: 2.66 Gy
Session Number: 1

## 2022-04-12 ENCOUNTER — Ambulatory Visit
Admission: RE | Admit: 2022-04-12 | Discharge: 2022-04-12 | Disposition: A | Discharge status: Home / Self Care | Payer: Medicare Other | Source: Ambulatory Visit | Attending: Radiation Oncology | Admitting: Radiation Oncology

## 2022-04-12 ENCOUNTER — Other Ambulatory Visit: Payer: Self-pay

## 2022-04-12 DIAGNOSIS — Z510 Encounter for antineoplastic radiation therapy: Secondary | ICD-10-CM | POA: Diagnosis not present

## 2022-04-12 LAB — RAD ONC ARIA SESSION SUMMARY
Course Elapsed Days: 1
Plan Fractions Treated to Date: 2
Plan Prescribed Dose Per Fraction: 2.66 Gy
Plan Total Fractions Prescribed: 16
Plan Total Prescribed Dose: 42.56 Gy
Reference Point Dosage Given to Date: 5.32 Gy
Reference Point Session Dosage Given: 2.66 Gy
Session Number: 2

## 2022-04-13 ENCOUNTER — Other Ambulatory Visit: Payer: Self-pay

## 2022-04-13 ENCOUNTER — Ambulatory Visit
Admission: RE | Admit: 2022-04-13 | Discharge: 2022-04-13 | Disposition: A | Discharge status: Home / Self Care | Payer: Medicare Other | Source: Ambulatory Visit | Attending: Radiation Oncology | Admitting: Radiation Oncology

## 2022-04-13 DIAGNOSIS — Z510 Encounter for antineoplastic radiation therapy: Secondary | ICD-10-CM | POA: Diagnosis not present

## 2022-04-13 LAB — RAD ONC ARIA SESSION SUMMARY
Course Elapsed Days: 2
Plan Fractions Treated to Date: 3
Plan Prescribed Dose Per Fraction: 2.66 Gy
Plan Total Fractions Prescribed: 16
Plan Total Prescribed Dose: 42.56 Gy
Reference Point Dosage Given to Date: 7.98 Gy
Reference Point Session Dosage Given: 2.66 Gy
Session Number: 3

## 2022-04-14 ENCOUNTER — Other Ambulatory Visit: Payer: Self-pay

## 2022-04-14 ENCOUNTER — Ambulatory Visit
Admission: RE | Admit: 2022-04-14 | Discharge: 2022-04-14 | Disposition: A | Discharge status: Home / Self Care | Payer: Medicare Other | Source: Ambulatory Visit | Attending: Radiation Oncology | Admitting: Radiation Oncology

## 2022-04-14 DIAGNOSIS — Z170 Estrogen receptor positive status [ER+]: Secondary | ICD-10-CM

## 2022-04-14 DIAGNOSIS — Z510 Encounter for antineoplastic radiation therapy: Secondary | ICD-10-CM | POA: Diagnosis not present

## 2022-04-14 LAB — RAD ONC ARIA SESSION SUMMARY
Course Elapsed Days: 3
Plan Fractions Treated to Date: 4
Plan Prescribed Dose Per Fraction: 2.66 Gy
Plan Total Fractions Prescribed: 16
Plan Total Prescribed Dose: 42.56 Gy
Reference Point Dosage Given to Date: 10.64 Gy
Reference Point Session Dosage Given: 2.66 Gy
Session Number: 4

## 2022-04-14 MED ORDER — RADIAPLEXRX EX GEL: Freq: Once | CUTANEOUS | Status: AC

## 2022-04-14 MED ORDER — ALRA NON-METALLIC DEODORANT (RAD-ONC)
1.0000 | Freq: Once | TOPICAL | Status: AC
  Administered 2022-04-14: 1 via TOPICAL

## 2022-04-14 NOTE — Progress Notes (Signed)
Pt here for patient teaching. Pt given Radiation and You booklet, skin care instructions, Alra deodorant, and Radiaplex gel. Reviewed areas of pertinence such as diarrhea, fatigue, hair loss, nausea and vomiting, skin changes, breast tenderness, breast swelling, and taste changes. Pt able to give teach back of to pat skin, use unscented/gentle soap, and drink plenty of water, apply Radiaplex bid, avoid applying anything to skin within 4 hours of treatment, avoid wearing an under wire bra, and to use an electric razor if they must shave. Pt verbalizes understanding of information given and will contact nursing with any questions or concerns.     [email or web address]      

## 2022-04-17 ENCOUNTER — Other Ambulatory Visit: Payer: Self-pay

## 2022-04-17 ENCOUNTER — Ambulatory Visit
Admission: RE | Admit: 2022-04-17 | Discharge: 2022-04-17 | Disposition: A | Discharge status: Home / Self Care | Payer: Medicare Other | Source: Ambulatory Visit | Attending: Radiation Oncology | Admitting: Radiation Oncology

## 2022-04-17 DIAGNOSIS — Z510 Encounter for antineoplastic radiation therapy: Secondary | ICD-10-CM | POA: Diagnosis not present

## 2022-04-17 LAB — RAD ONC ARIA SESSION SUMMARY
Course Elapsed Days: 6
Plan Fractions Treated to Date: 5
Plan Prescribed Dose Per Fraction: 2.66 Gy
Plan Total Fractions Prescribed: 16
Plan Total Prescribed Dose: 42.56 Gy
Reference Point Dosage Given to Date: 13.3 Gy
Reference Point Session Dosage Given: 2.66 Gy
Session Number: 5

## 2022-04-18 ENCOUNTER — Ambulatory Visit
Admission: RE | Admit: 2022-04-18 | Discharge: 2022-04-18 | Disposition: A | Discharge status: Home / Self Care | Payer: Medicare Other | Source: Ambulatory Visit | Attending: Radiation Oncology | Admitting: Radiation Oncology

## 2022-04-18 ENCOUNTER — Other Ambulatory Visit: Payer: Self-pay

## 2022-04-18 DIAGNOSIS — Z510 Encounter for antineoplastic radiation therapy: Secondary | ICD-10-CM | POA: Diagnosis not present

## 2022-04-18 LAB — RAD ONC ARIA SESSION SUMMARY
Course Elapsed Days: 7
Plan Fractions Treated to Date: 6
Plan Prescribed Dose Per Fraction: 2.66 Gy
Plan Total Fractions Prescribed: 16
Plan Total Prescribed Dose: 42.56 Gy
Reference Point Dosage Given to Date: 15.96 Gy
Reference Point Session Dosage Given: 2.66 Gy
Session Number: 6

## 2022-04-19 ENCOUNTER — Ambulatory Visit
Admission: RE | Admit: 2022-04-19 | Discharge: 2022-04-19 | Disposition: A | Discharge status: Home / Self Care | Payer: Medicare Other | Source: Ambulatory Visit | Attending: Radiation Oncology | Admitting: Radiation Oncology

## 2022-04-19 ENCOUNTER — Other Ambulatory Visit: Payer: Self-pay

## 2022-04-19 DIAGNOSIS — Z510 Encounter for antineoplastic radiation therapy: Secondary | ICD-10-CM | POA: Diagnosis not present

## 2022-04-19 LAB — RAD ONC ARIA SESSION SUMMARY
Course Elapsed Days: 8
Plan Fractions Treated to Date: 7
Plan Prescribed Dose Per Fraction: 2.66 Gy
Plan Total Fractions Prescribed: 16
Plan Total Prescribed Dose: 42.56 Gy
Reference Point Dosage Given to Date: 18.62 Gy
Reference Point Session Dosage Given: 2.66 Gy
Session Number: 7

## 2022-04-20 ENCOUNTER — Ambulatory Visit
Admission: RE | Admit: 2022-04-20 | Discharge: 2022-04-20 | Disposition: A | Discharge status: Home / Self Care | Payer: Medicare Other | Source: Ambulatory Visit | Attending: Radiation Oncology | Admitting: Radiation Oncology

## 2022-04-20 ENCOUNTER — Other Ambulatory Visit: Payer: Self-pay

## 2022-04-20 DIAGNOSIS — Z510 Encounter for antineoplastic radiation therapy: Secondary | ICD-10-CM | POA: Diagnosis not present

## 2022-04-20 LAB — RAD ONC ARIA SESSION SUMMARY
Course Elapsed Days: 9
Plan Fractions Treated to Date: 8
Plan Prescribed Dose Per Fraction: 2.66 Gy
Plan Total Fractions Prescribed: 16
Plan Total Prescribed Dose: 42.56 Gy
Reference Point Dosage Given to Date: 21.28 Gy
Reference Point Session Dosage Given: 2.66 Gy
Session Number: 8

## 2022-04-21 ENCOUNTER — Ambulatory Visit: Payer: Medicare Other

## 2022-04-21 ENCOUNTER — Ambulatory Visit
Admission: RE | Admit: 2022-04-21 | Discharge: 2022-04-21 | Disposition: A | Discharge status: Home / Self Care | Payer: Medicare Other | Source: Ambulatory Visit | Attending: Radiation Oncology | Admitting: Radiation Oncology

## 2022-04-21 ENCOUNTER — Other Ambulatory Visit: Payer: Self-pay

## 2022-04-21 DIAGNOSIS — Z510 Encounter for antineoplastic radiation therapy: Secondary | ICD-10-CM | POA: Diagnosis not present

## 2022-04-21 LAB — RAD ONC ARIA SESSION SUMMARY
Course Elapsed Days: 10
Plan Fractions Treated to Date: 9
Plan Prescribed Dose Per Fraction: 2.66 Gy
Plan Total Fractions Prescribed: 16
Plan Total Prescribed Dose: 42.56 Gy
Reference Point Dosage Given to Date: 23.94 Gy
Reference Point Session Dosage Given: 2.66 Gy
Session Number: 9

## 2022-04-24 ENCOUNTER — Other Ambulatory Visit: Payer: Self-pay

## 2022-04-24 ENCOUNTER — Ambulatory Visit
Admission: RE | Admit: 2022-04-24 | Discharge: 2022-04-24 | Disposition: A | Discharge status: Home / Self Care | Payer: Medicare Other | Source: Ambulatory Visit | Attending: Radiation Oncology | Admitting: Radiation Oncology

## 2022-04-24 DIAGNOSIS — Z510 Encounter for antineoplastic radiation therapy: Secondary | ICD-10-CM | POA: Diagnosis not present

## 2022-04-24 LAB — RAD ONC ARIA SESSION SUMMARY
Course Elapsed Days: 13
Plan Fractions Treated to Date: 10
Plan Prescribed Dose Per Fraction: 2.66 Gy
Plan Total Fractions Prescribed: 16
Plan Total Prescribed Dose: 42.56 Gy
Reference Point Dosage Given to Date: 26.6 Gy
Reference Point Session Dosage Given: 2.66 Gy
Session Number: 10

## 2022-04-25 ENCOUNTER — Other Ambulatory Visit: Payer: Self-pay

## 2022-04-25 ENCOUNTER — Ambulatory Visit
Admission: RE | Admit: 2022-04-25 | Discharge: 2022-04-25 | Disposition: A | Discharge status: Home / Self Care | Payer: Medicare Other | Source: Ambulatory Visit | Attending: Radiation Oncology | Admitting: Radiation Oncology

## 2022-04-25 DIAGNOSIS — Z510 Encounter for antineoplastic radiation therapy: Secondary | ICD-10-CM | POA: Diagnosis not present

## 2022-04-25 LAB — RAD ONC ARIA SESSION SUMMARY
Course Elapsed Days: 14
Plan Fractions Treated to Date: 11
Plan Prescribed Dose Per Fraction: 2.66 Gy
Plan Total Fractions Prescribed: 16
Plan Total Prescribed Dose: 42.56 Gy
Reference Point Dosage Given to Date: 29.26 Gy
Reference Point Session Dosage Given: 2.66 Gy
Session Number: 11

## 2022-04-26 ENCOUNTER — Other Ambulatory Visit: Payer: Self-pay

## 2022-04-26 ENCOUNTER — Ambulatory Visit
Admission: RE | Admit: 2022-04-26 | Discharge: 2022-04-26 | Disposition: A | Discharge status: Home / Self Care | Payer: Medicare Other | Source: Ambulatory Visit | Attending: Radiation Oncology | Admitting: Radiation Oncology

## 2022-04-26 DIAGNOSIS — Z510 Encounter for antineoplastic radiation therapy: Secondary | ICD-10-CM | POA: Diagnosis not present

## 2022-04-26 LAB — RAD ONC ARIA SESSION SUMMARY
Course Elapsed Days: 15
Plan Fractions Treated to Date: 12
Plan Prescribed Dose Per Fraction: 2.66 Gy
Plan Total Fractions Prescribed: 16
Plan Total Prescribed Dose: 42.56 Gy
Reference Point Dosage Given to Date: 31.92 Gy
Reference Point Session Dosage Given: 2.66 Gy
Session Number: 12

## 2022-04-27 ENCOUNTER — Other Ambulatory Visit: Payer: Self-pay

## 2022-04-27 ENCOUNTER — Ambulatory Visit
Admission: RE | Admit: 2022-04-27 | Discharge: 2022-04-27 | Disposition: A | Discharge status: Home / Self Care | Payer: Medicare Other | Source: Ambulatory Visit | Attending: Radiation Oncology | Admitting: Radiation Oncology

## 2022-04-27 DIAGNOSIS — Z510 Encounter for antineoplastic radiation therapy: Secondary | ICD-10-CM | POA: Diagnosis not present

## 2022-04-27 LAB — RAD ONC ARIA SESSION SUMMARY
Course Elapsed Days: 16
Plan Fractions Treated to Date: 13
Plan Prescribed Dose Per Fraction: 2.66 Gy
Plan Total Fractions Prescribed: 16
Plan Total Prescribed Dose: 42.56 Gy
Reference Point Dosage Given to Date: 34.58 Gy
Reference Point Session Dosage Given: 2.66 Gy
Session Number: 13

## 2022-04-28 ENCOUNTER — Ambulatory Visit: Payer: Medicare Other | Admitting: Radiation Oncology

## 2022-04-28 ENCOUNTER — Ambulatory Visit
Admission: RE | Admit: 2022-04-28 | Discharge: 2022-04-28 | Disposition: A | Discharge status: Home / Self Care | Payer: Medicare Other | Source: Ambulatory Visit | Attending: Radiation Oncology | Admitting: Radiation Oncology

## 2022-04-28 ENCOUNTER — Other Ambulatory Visit: Payer: Self-pay

## 2022-04-28 DIAGNOSIS — Z510 Encounter for antineoplastic radiation therapy: Secondary | ICD-10-CM | POA: Diagnosis not present

## 2022-04-28 LAB — RAD ONC ARIA SESSION SUMMARY
Course Elapsed Days: 17
Plan Fractions Treated to Date: 14
Plan Prescribed Dose Per Fraction: 2.66 Gy
Plan Total Fractions Prescribed: 16
Plan Total Prescribed Dose: 42.56 Gy
Reference Point Dosage Given to Date: 37.24 Gy
Reference Point Session Dosage Given: 2.66 Gy
Session Number: 14

## 2022-05-01 ENCOUNTER — Ambulatory Visit
Admission: RE | Admit: 2022-05-01 | Discharge: 2022-05-01 | Disposition: A | Discharge status: Home / Self Care | Payer: Medicare Other | Source: Ambulatory Visit | Attending: Radiation Oncology | Admitting: Radiation Oncology

## 2022-05-01 ENCOUNTER — Other Ambulatory Visit: Payer: Self-pay

## 2022-05-01 DIAGNOSIS — Z510 Encounter for antineoplastic radiation therapy: Secondary | ICD-10-CM | POA: Diagnosis not present

## 2022-05-01 DIAGNOSIS — Z170 Estrogen receptor positive status [ER+]: Secondary | ICD-10-CM | POA: Insufficient documentation

## 2022-05-01 DIAGNOSIS — C50411 Malignant neoplasm of upper-outer quadrant of right female breast: Secondary | ICD-10-CM | POA: Insufficient documentation

## 2022-05-01 LAB — RAD ONC ARIA SESSION SUMMARY
Course Elapsed Days: 20
Plan Fractions Treated to Date: 15
Plan Prescribed Dose Per Fraction: 2.66 Gy
Plan Total Fractions Prescribed: 16
Plan Total Prescribed Dose: 42.56 Gy
Reference Point Dosage Given to Date: 39.9 Gy
Reference Point Session Dosage Given: 2.66 Gy
Session Number: 15

## 2022-05-02 ENCOUNTER — Other Ambulatory Visit: Payer: Self-pay

## 2022-05-02 ENCOUNTER — Ambulatory Visit
Admission: RE | Admit: 2022-05-02 | Discharge: 2022-05-02 | Disposition: A | Discharge status: Home / Self Care | Payer: Medicare Other | Source: Ambulatory Visit | Attending: Radiation Oncology | Admitting: Radiation Oncology

## 2022-05-02 DIAGNOSIS — Z510 Encounter for antineoplastic radiation therapy: Secondary | ICD-10-CM | POA: Diagnosis not present

## 2022-05-02 LAB — RAD ONC ARIA SESSION SUMMARY
Course Elapsed Days: 21
Plan Fractions Treated to Date: 16
Plan Prescribed Dose Per Fraction: 2.66 Gy
Plan Total Fractions Prescribed: 16
Plan Total Prescribed Dose: 42.56 Gy
Reference Point Dosage Given to Date: 42.56 Gy
Reference Point Session Dosage Given: 2.66 Gy
Session Number: 16

## 2022-05-03 ENCOUNTER — Other Ambulatory Visit: Payer: Self-pay

## 2022-05-03 ENCOUNTER — Ambulatory Visit
Admission: RE | Admit: 2022-05-03 | Discharge: 2022-05-03 | Disposition: A | Discharge status: Home / Self Care | Payer: Medicare Other | Source: Ambulatory Visit | Attending: Radiation Oncology | Admitting: Radiation Oncology

## 2022-05-03 DIAGNOSIS — Z510 Encounter for antineoplastic radiation therapy: Secondary | ICD-10-CM | POA: Diagnosis not present

## 2022-05-03 LAB — RAD ONC ARIA SESSION SUMMARY
Course Elapsed Days: 22
Plan Fractions Treated to Date: 1
Plan Prescribed Dose Per Fraction: 2 Gy
Plan Total Fractions Prescribed: 4
Plan Total Prescribed Dose: 8 Gy
Reference Point Dosage Given to Date: 2 Gy
Reference Point Session Dosage Given: 2 Gy
Session Number: 17

## 2022-05-04 ENCOUNTER — Ambulatory Visit
Admission: RE | Admit: 2022-05-04 | Discharge: 2022-05-04 | Disposition: A | Discharge status: Home / Self Care | Payer: Medicare Other | Source: Ambulatory Visit | Attending: Radiation Oncology | Admitting: Radiation Oncology

## 2022-05-04 ENCOUNTER — Other Ambulatory Visit: Payer: Self-pay

## 2022-05-04 DIAGNOSIS — Z510 Encounter for antineoplastic radiation therapy: Secondary | ICD-10-CM | POA: Diagnosis not present

## 2022-05-04 LAB — RAD ONC ARIA SESSION SUMMARY
Course Elapsed Days: 23
Plan Fractions Treated to Date: 2
Plan Prescribed Dose Per Fraction: 2 Gy
Plan Total Fractions Prescribed: 4
Plan Total Prescribed Dose: 8 Gy
Reference Point Dosage Given to Date: 4 Gy
Reference Point Session Dosage Given: 2 Gy
Session Number: 18

## 2022-05-05 ENCOUNTER — Ambulatory Visit
Admission: RE | Admit: 2022-05-05 | Discharge: 2022-05-05 | Disposition: A | Discharge status: Home / Self Care | Payer: Medicare Other | Source: Ambulatory Visit | Attending: Radiation Oncology | Admitting: Radiation Oncology

## 2022-05-05 ENCOUNTER — Other Ambulatory Visit: Payer: Self-pay

## 2022-05-05 DIAGNOSIS — Z510 Encounter for antineoplastic radiation therapy: Secondary | ICD-10-CM | POA: Diagnosis not present

## 2022-05-05 LAB — RAD ONC ARIA SESSION SUMMARY
Course Elapsed Days: 24
Plan Fractions Treated to Date: 3
Plan Prescribed Dose Per Fraction: 2 Gy
Plan Total Fractions Prescribed: 4
Plan Total Prescribed Dose: 8 Gy
Reference Point Dosage Given to Date: 6 Gy
Reference Point Session Dosage Given: 2 Gy
Session Number: 19

## 2022-05-08 ENCOUNTER — Ambulatory Visit
Admission: RE | Admit: 2022-05-08 | Discharge: 2022-05-08 | Disposition: A | Discharge status: Home / Self Care | Payer: Medicare Other | Source: Ambulatory Visit | Attending: Radiation Oncology | Admitting: Radiation Oncology

## 2022-05-08 ENCOUNTER — Encounter: Payer: Self-pay | Admitting: Radiation Oncology

## 2022-05-08 ENCOUNTER — Other Ambulatory Visit: Payer: Self-pay

## 2022-05-08 ENCOUNTER — Encounter: Payer: Self-pay | Admitting: *Deleted

## 2022-05-08 DIAGNOSIS — Z510 Encounter for antineoplastic radiation therapy: Secondary | ICD-10-CM | POA: Diagnosis not present

## 2022-05-08 DIAGNOSIS — D0511 Intraductal carcinoma in situ of right breast: Secondary | ICD-10-CM

## 2022-05-08 LAB — RAD ONC ARIA SESSION SUMMARY
Course Elapsed Days: 27
Plan Fractions Treated to Date: 4
Plan Prescribed Dose Per Fraction: 2 Gy
Plan Total Fractions Prescribed: 4
Plan Total Prescribed Dose: 8 Gy
Reference Point Dosage Given to Date: 8 Gy
Reference Point Session Dosage Given: 2 Gy
Session Number: 20

## 2022-05-08 NOTE — Progress Notes (Signed)
Patient Care Team: Charlies Silvers, PA-C as PCP - General (Physician Assistant) Pershing Proud, RN as Oncology Nurse Navigator Donnelly Angelica, RN as Oncology Nurse Navigator Serena Croissant, MD as Consulting Physician (Hematology and Oncology) Emelia Loron, MD as Consulting Physician (General Surgery) Dorothy Puffer, MD as Consulting Physician (Radiation Oncology)  DIAGNOSIS: No diagnosis found.  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 Right lateral lumpectomy: DCIS grade 2, incidental grade 1 IDC 1 cm, additional grade 1 IDC 4 mm, margins negative, ER 90%, PR 10%, Ki-67 1%, HER2 negative 1+   03/13/2022 Cancer Staging   Staging form: Breast, AJCC 8th Edition - Pathologic: Stage IA (pT1b, pN0, cM0, G1, ER+, PR+, HER2-) - Signed by Serena Croissant, MD on 03/13/2022 Histologic grading system: 3 grade system     CHIEF COMPLIANT:   INTERVAL HISTORY: Christie Fox is a   ALLERGIES:  has no active allergies.  MEDICATIONS:  Current Outpatient Medications  Medication Sig Dispense Refill   ALPRAZolam (XANAX) 1 MG tablet Take 1 mg by mouth 3 (three) times daily as needed for anxiety.      Brimonidine Tartrate (LUMIFY) 0.025 % SOLN Place 1 drop into both eyes daily as needed (for irritated/dry eyes.). LUMIFY     buPROPion (WELLBUTRIN XL) 300 MG 24 hr tablet Take 300 mg by mouth daily.     furosemide (LASIX) 40 MG tablet Take 40 mg by mouth daily.     hydroxypropyl methylcellulose / hypromellose (ISOPTO TEARS / GONIOVISC) 2.5 % ophthalmic solution Place 1 drop into both eyes 3 (three) times daily as needed for dry eyes. INSTANT TEARS     levothyroxine (SYNTHROID, LEVOTHROID) 100 MCG tablet Take 100 mcg by mouth daily before breakfast.     perindopril (ACEON) 4 MG tablet Take 4 mg by mouth daily.      rosuvastatin (CRESTOR) 10 MG tablet Take 10 mg by mouth daily at 6 PM. (1700)     traMADol (ULTRAM) 50 MG tablet Take 1 tablet (50 mg total) by mouth every 6 (six) hours as needed. 10 tablet 0   No current facility-administered medications for this visit.    PHYSICAL EXAMINATION: ECOG PERFORMANCE STATUS: {CHL ONC ECOG PS:[id number]}  There were no vitals filed for this visit. There were no vitals filed for this visit.  BREAST:*** No palpable masses or nodules in either right or left breasts. No palpable axillary supraclavicular or infraclavicular adenopathy no breast tenderness or nipple discharge. (exam performed in the presence of a chaperone)  LABORATORY DATA:  I have reviewed the data as listed    Latest Ref Rng & Units 02/24/2022    9:46 AM 01/04/2022    8:06 AM 03/20/2017   10:57 AM  CMP  Glucose 70 - 99 mg/dL 704  888  107   BUN 8 - 23 mg/dL 18  19  17    Creatinine 0.44 - 1.00 mg/dL 1.82  9.93  7.16   Sodium 135 - 145 mmol/L 134  140  141   Potassium 3.5 - 5.1 mmol/L 3.4  3.1  4.4   Chloride 98 - 111 mmol/L 94  97  104   CO2 22 - 32 mmol/L 30  35  25   Calcium 8.9 - 10.3 mg/dL 9.5  96.7  9.6   Total Protein 6.5 - 8.1 g/dL  7.3  8.0   Total Bilirubin 0.3 - 1.2 mg/dL  0.7  0.6   Alkaline Phos 38 - 126 U/L  77  59   AST 15 - 41 U/L  18  32   ALT 0 - 44 U/L  12  27      Lab Results  Component Value Date   WBC 7.8 01/04/2022   HGB 13.6 01/04/2022   HCT 40.9 01/04/2022   MCV 92.5 01/04/2022   PLT 352 01/04/2022   NEUTROABS 5.4 01/04/2022    ASSESSMENT & PLAN:  No problem-specific Assessment & Plan notes found for this encounter.    No orders of the defined types were placed in this encounter.  The patient has a good understanding of the overall plan. she agrees with it. she will call with any problems that may develop before the next visit here. Total time spent: 30 mins including face to face time and time spent for planning, charting and co-ordination of care   Sherlyn Lick, CMA 05/08/22    I Janan Ridge am acting as a Neurosurgeon for [employer]  ***

## 2022-05-09 ENCOUNTER — Inpatient Hospital Stay: Payer: Medicare Other | Attending: Hematology and Oncology | Admitting: Hematology and Oncology

## 2022-05-09 VITALS — BP 119/78 | HR 91 | Temp 97.7°F | Resp 18 | Ht 62.0 in | Wt 209.6 lb

## 2022-05-09 DIAGNOSIS — L598 Other specified disorders of the skin and subcutaneous tissue related to radiation: Secondary | ICD-10-CM | POA: Insufficient documentation

## 2022-05-09 DIAGNOSIS — Z780 Asymptomatic menopausal state: Secondary | ICD-10-CM | POA: Diagnosis not present

## 2022-05-09 DIAGNOSIS — Z79899 Other long term (current) drug therapy: Secondary | ICD-10-CM | POA: Insufficient documentation

## 2022-05-09 DIAGNOSIS — C50411 Malignant neoplasm of upper-outer quadrant of right female breast: Secondary | ICD-10-CM | POA: Insufficient documentation

## 2022-05-09 DIAGNOSIS — Z923 Personal history of irradiation: Secondary | ICD-10-CM | POA: Insufficient documentation

## 2022-05-09 DIAGNOSIS — Z170 Estrogen receptor positive status [ER+]: Secondary | ICD-10-CM | POA: Insufficient documentation

## 2022-05-09 MED ORDER — OXYCODONE-ACETAMINOPHEN 5-325 MG PO TABS: 1.0000 | ORAL_TABLET | Freq: Three times a day (TID) | ORAL | 0 refills | Status: DC | PRN

## 2022-05-09 NOTE — Assessment & Plan Note (Signed)
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 Right lateral lumpectomy: DCIS grade 2, incidental grade 1 IDC 1 cm, additional grade 1 IDC 4 mm, margins negative, ER 90%, PR 10%, Ki-67 1%, HER2 negative 1+ Oncotype DX recurrence score: 22 (8% risk of distant recurrence with tamoxifen alone)   Treatment plan: Adjuvant radiation completed 05/08/2022 Adjuvant antiestrogen therapy   Anastrozole counseling: We discussed the risks and benefits of anti-estrogen therapy with aromatase inhibitors. These include but not limited to insomnia, hot flashes, mood changes, vaginal dryness, bone density loss, and weight gain. We strongly believe that the benefits far outweigh the risks. Patient understands these risks and consented to starting treatment. Planned treatment duration is 7 years.  Return to clinic in 3 months for survivorship care plan visit

## 2022-05-15 NOTE — Progress Notes (Signed)
  Radiation Oncology         [phone] ________________________________  Name: Christie Fox MRN: [id number]  Date: 05/08/2022  DOB: 10-11-1955  End of Treatment Note  Diagnosis:   Stage IA, pT1b, cN0M0, grade 1, ER/PR positive invasive ductal carcinoma of the right breast.   Indication for treatment:  Curative       Radiation treatment dates:   04/11/22-4/8/2  Site/dose:   The patient initially received a dose of 42.56 Gy in 16 fractions to the breast using whole-breast tangent fields. This was delivered using a 3-D conformal technique. The patient then received a boost to the seroma. This delivered an additional 8 Gy in 45fractions using a 3 field photon technique due to the depth of the seroma. The total dose was 50.56 Gy.  Narrative: The patient tolerated radiation treatment relatively well. She developed fatigue and anticipated skin changes in the treatment field.   Plan: The patient will receive a call in about one month from the radiation oncology department. She will continue follow up with Dr. Pamelia Hoit as well.      Osker Mason, PAC

## 2022-06-19 ENCOUNTER — Ambulatory Visit
Admission: RE | Admit: 2022-06-19 | Discharge: 2022-06-19 | Disposition: A | Discharge status: Home / Self Care | Payer: Medicare Other | Source: Ambulatory Visit | Attending: Radiation Oncology | Admitting: Radiation Oncology

## 2022-06-19 NOTE — Progress Notes (Signed)
  Radiation Oncology         [phone]) [phone] ________________________________  Name: Christie Fox MRN: [id number]  Date of Service: 06/19/2022  DOB: 22-May-1955  Post Treatment Telephone Note  Diagnosis:   Stage IA, pT1b, cN0M0, grade 1, ER/PR positive invasive ductal carcinoma of the right breast.    Indication for treatment:  Curative        Radiation treatment dates:   04/11/22-4/8/2(as documented in provider EOT note)   The patient was available for call today.   Symptoms of fatigue have improved since completing therapy.  Symptoms of skin changes have improved mildly since completing therapy.  The patient was encouraged to avoid sun exposure in the area of prior treatment for up to one year following radiation with either sunscreen or by the style of clothing worn in the sun.  The patient has scheduled follow up with her medical oncologist Dr. Pamelia Hoit for ongoing surveillance, and was encouraged to call if she develops concerns or questions regarding radiation.  This concludes the interview.   Ruel Favors, LPN

## 2022-07-17 ENCOUNTER — Ambulatory Visit (HOSPITAL_BASED_OUTPATIENT_CLINIC_OR_DEPARTMENT_OTHER)
Admission: RE | Admit: 2022-07-17 | Discharge: 2022-07-17 | Disposition: A | Discharge status: Home / Self Care | Payer: Medicare Other | Source: Ambulatory Visit | Attending: Hematology and Oncology | Admitting: Hematology and Oncology

## 2022-07-17 DIAGNOSIS — Z780 Asymptomatic menopausal state: Secondary | ICD-10-CM | POA: Diagnosis present

## 2022-07-17 DIAGNOSIS — C50411 Malignant neoplasm of upper-outer quadrant of right female breast: Secondary | ICD-10-CM | POA: Insufficient documentation

## 2022-07-17 DIAGNOSIS — Z170 Estrogen receptor positive status [ER+]: Secondary | ICD-10-CM | POA: Diagnosis present

## 2022-07-26 ENCOUNTER — Telehealth: Payer: Self-pay | Admitting: *Deleted

## 2022-07-26 NOTE — Telephone Encounter (Signed)
Patient called with concerns of right arm numbness, decreased right hand grip, throbbing pain in the right arm, and right hand swelling off and on since completion of her radiation therapy 05/08/2022.  She states this wakes her up every night at 3 am.  She reports she has not seen her primary care or physical therapy and is calling to find out what to do.  Per MD and PA these symptoms are not felt to be in relation to her previous radiation therapy and they are recommending she see her primary care provider and PT for evaluation.  Patient is not thrilled about this and states she does not want to acquire another doctor as she knows her PCP would refer her to a nerve specialist.  She was reassured that a nerve specialist would be the correct and best path to take as her symptoms sound like they are nerve related.  She verbalized understanding and will reach out to her PCP.  Lind Covert RN, BSN

## 2022-08-09 ENCOUNTER — Other Ambulatory Visit: Payer: Self-pay

## 2022-08-09 ENCOUNTER — Inpatient Hospital Stay: Payer: Medicare Other | Attending: Hematology and Oncology | Admitting: Adult Health

## 2022-08-09 ENCOUNTER — Encounter: Payer: Self-pay | Admitting: Adult Health

## 2022-08-09 VITALS — BP 146/82 | HR 90 | Temp 99.6°F | Resp 18 | Wt 215.4 lb

## 2022-08-09 DIAGNOSIS — Z923 Personal history of irradiation: Secondary | ICD-10-CM | POA: Insufficient documentation

## 2022-08-09 DIAGNOSIS — Z170 Estrogen receptor positive status [ER+]: Secondary | ICD-10-CM | POA: Diagnosis not present

## 2022-08-09 DIAGNOSIS — C50411 Malignant neoplasm of upper-outer quadrant of right female breast: Secondary | ICD-10-CM | POA: Diagnosis present

## 2022-08-09 NOTE — Progress Notes (Signed)
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 Right lateral lumpectomy: DCIS grade 2, incidental grade 1 IDC 1 cm, additional grade 1 IDC 4 mm, margins negative, ER 90%, PR 10%, Ki-67 1%, HER2 negative 1+   03/13/2022 Cancer Staging   Staging form: Breast, AJCC 8th Edition - Pathologic: Stage IA (pT1b, pN0, cM0, G1, ER+, PR+, HER2-) - Signed by Serena Croissant, MD on 03/13/2022 Histologic grading system: 3 grade system   03/15/2022 Oncotype testing   Oncotype DX recurrence score: 22 (8% risk of distant recurrence with tamoxifen alone)   04/11/2022 - 05/08/2022 Radiation Therapy   Right Breast:  42.56 Gy in 16 treatments "Boost": 8 Gy in 4 treatments    05/2022 -  Anti-estrogen oral therapy   Anastrozole     INTERVAL HISTORY:  Christie Fox to review her survivorship care plan detailing her treatment course for breast cancer, as well as monitoring long-term side effects of that treatment, education regarding health maintenance, screening, and overall wellness and health promotion.     Overall, Christie Fox reports feeling quite well   REVIEW OF SYSTEMS:  Review of Systems  Constitutional:   Negative for appetite change, chills, fatigue, fever and unexpected weight change.  HENT:   Negative for hearing loss, lump/mass and trouble swallowing.   Eyes:  Negative for eye problems and icterus.  Respiratory:  Negative for chest tightness, cough and shortness of breath.   Cardiovascular:  Negative for chest pain, leg swelling and palpitations.  Gastrointestinal:  Negative for abdominal distention, abdominal pain, constipation, diarrhea, nausea and vomiting.  Endocrine: Negative for hot flashes.  Genitourinary:  Negative for difficulty urinating.   Musculoskeletal:  Negative for arthralgias.  Skin:  Negative for itching and rash.  Neurological:  Negative for dizziness, extremity weakness, headaches and numbness.  Hematological:  Negative for adenopathy. Does not bruise/bleed easily.  Psychiatric/Behavioral:  Negative for depression. The patient is not nervous/anxious.    Breast: Denies any new nodularity, masses, tenderness, nipple changes, or nipple discharge.       PAST MEDICAL/SURGICAL HISTORY:  Past Medical History:  Diagnosis Date  . Anxiety   . Arthritis    knees  . At risk for sleep apnea    STOP-BANG= 4    SENT TO PCP 07-31-2013  . Borderline diabetes    diet controlled  . Breast cancer (HCC)   . Dysuria   . History of kidney stones   . Hyperlipidemia   . Hypertension   . Hypothyroidism   . Migraines   . PONV (postoperative nausea and vomiting)    "please call me [name] to wake me up , not Mrs Schewe"   . Renal calculus, left   . Urgency of urination    Past Surgical History:  Procedure  Laterality Date  . BREAST LUMPECTOMY WITH RADIOACTIVE SEED LOCALIZATION Right 02/27/2022   Procedure: RIGHT BREAST LUMPECTOMY WITH RADIOACTIVE SEED LOCALIZATION X3;  Surgeon: Emelia Loron, MD;  Location: [facility];  Service: General;  Laterality: Right;  . CESAREAN SECTION  x2  . CYSTOSCOPY WITH RETROGRADE PYELOGRAM, URETEROSCOPY AND STENT PLACEMENT Left  08/04/2013   Procedure: 1ST STAGE CYSTOSCOPY WITH RETROGRADE PYELOGRAM, URETEROSCOPY AND STENT PLACEMENT;  Surgeon: Sebastian Ache, MD;  Location: [facility];  Service: Urology;  Laterality: Left;  . CYSTOSCOPY WITH RETROGRADE PYELOGRAM, URETEROSCOPY AND STENT PLACEMENT Left 08/20/2013   Procedure: 2ND STAGE CYSTOSCOPY WITH RETROGRADE PYELOGRAM, URETEROSCOPY BASKET STONES AND STENT EXCHANGE, BLOOD MOP;  Surgeon: Sebastian Ache, MD;  Location: [facility];  Service: Urology;  Laterality: Left;  . EXPLORATORY LAPAROTOMY W/ BILATERAL SALPINOOPHORECTOMY  11-27-2001  . HOLMIUM LASER APPLICATION Left 08/04/2013   Procedure: HOLMIUM LASER APPLICATION;  Surgeon: Sebastian Ache, MD;  Location: [facility];  Service: Urology;  Laterality: Left;  . LAPAROSCOPIC GASTRIC SLEEVE RESECTION N/A 03/27/2017   Procedure: LAPAROSCOPIC GASTRIC SLEEVE RESECTION WITH UPPER ENDO;  Surgeon: Ovidio Kin, MD;  Location: [facility];  Service: General;  Laterality: N/A;  . LEFT URETEROSCOPIC LASER LITHO STONE EXTRACTION AND STENT PLACEMENT  03-25-2005  . LUMBAR FUSION  X2   LAST ONE 2000  . ORIF LEFT ANKLE FX  03-20-2000   RETAINED HARDWARE  . RADIOACTIVE SEED GUIDED EXCISIONAL BREAST BIOPSY Left 02/27/2022   Procedure: RADIOACTIVE SEED GUIDED EXCISIONAL LEFT BREAST BIOPSY;  Surgeon: Emelia Loron, MD;  Location: [facility];  Service: General;  Laterality: Left;  Marland Kitchen VAGINAL HYSTERECTOMY  1999  . WISDOM TOOTH EXTRACTION       ALLERGIES:  Allergies  Allergen Reactions  . Shellfish Allergy Anaphylaxis     CURRENT MEDICATIONS:  Outpatient Encounter Medications as of 08/09/2022  Medication Sig  . ALPRAZolam (XANAX) 1 MG tablet Take 1 mg by mouth 3 (three) times daily as needed for anxiety.  Marland Kitchen buPROPion (WELLBUTRIN XL) 300 MG 24 hr tablet Take 300 mg by mouth daily.  . furosemide (LASIX) 40 MG tablet Take 40 mg by mouth daily.  Marland Kitchen levothyroxine (SYNTHROID, LEVOTHROID) 100 MCG tablet Take 100 mcg by mouth  daily before breakfast.  . perindopril (ACEON) 4 MG tablet Take 4 mg by mouth daily.   . rosuvastatin (CRESTOR) 10 MG tablet Take 10 mg by mouth daily at 6 PM. (1700)  . [DISCONTINUED] Brimonidine Tartrate (LUMIFY) 0.025 % SOLN Place 1 drop into both eyes daily as needed (for irritated/dry eyes.). LUMIFY (Patient not taking: Reported on 08/09/2022)  . [DISCONTINUED] hydroxypropyl methylcellulose / hypromellose (ISOPTO TEARS / GONIOVISC) 2.5 % ophthalmic solution Place 1 drop into both eyes 3 (three) times daily as needed for dry eyes. INSTANT TEARS (Patient not taking: Reported on 08/09/2022)  . [DISCONTINUED] oxyCODONE-acetaminophen (PERCOCET/ROXICET) 5-325 MG tablet Take 1 tablet by mouth every 8 (eight) hours as needed for severe pain. (Patient not taking: Reported on 08/09/2022)  . [DISCONTINUED] traMADol (ULTRAM) 50 MG tablet Take 1 tablet (50 mg total) by mouth every 6 (six) hours as needed. (Patient not taking: Reported on 08/09/2022)   No facility-administered encounter medications on file as of 08/09/2022.     ONCOLOGIC FAMILY HISTORY:  Family History  Problem Relation Age of Onset  . Breast cancer Mother 68     SOCIAL HISTORY:  Social History   Socioeconomic History  . Marital status: Married    Spouse name: Not on file  .  Number of children: Not on file  . Years of education: Not on file  . Highest education level: Not on file  Occupational History  . Not on file  Tobacco Use  . Smoking status: Never  . Smokeless tobacco: Never  Vaping Use  . Vaping Use: Never used  Substance and Sexual Activity  . Alcohol use: Yes    Comment: wine socially  . Drug use: No  . Sexual activity: Not on file  Other Topics Concern  . Not on file  Social History Narrative  . Not on file   Social Determinants of Health   Financial Resource Strain: Low Risk  (01/04/2022)   Overall Financial Resource Strain (CARDIA)   . Difficulty of Paying Living Expenses: Not very hard  Food  Insecurity: No Food Insecurity (01/04/2022)   Hunger Vital Sign   . Worried About Programme researcher, broadcasting/film/video in the Last Year: Never true   . Ran Out of Food in the Last Year: Never true  Transportation Needs: No Transportation Needs (01/04/2022)   PRAPARE - Transportation   . Lack of Transportation (Medical): No   . Lack of Transportation (Non-Medical): No  Physical Activity: Not on file  Stress: Not on file  Social Connections: Not on file  Intimate Partner Violence: Not on file     OBSERVATIONS/OBJECTIVE:  BP (!) 146/82 (BP Location: Left Arm, Patient Position: Sitting) Comment: Notfied LPN  Pulse 90   Temp 99.6 F (37.6 C) (Tympanic)   Resp 18   Wt 215 lb 6.4 oz (97.7 kg)   SpO2 96%   BMI 39.40 kg/m  GENERAL: Patient is a well appearing female in no acute distress HEENT:  Sclerae anicteric.  Oropharynx clear and moist. No ulcerations or evidence of oropharyngeal candidiasis. Neck is supple.  NODES:  No cervical, supraclavicular, or axillary lymphadenopathy palpated.  BREAST EXAM: Left breast status postlumpectomy, benign, right breast status postlumpectomy and radiation no sign of local recurrence. LUNGS:  Clear to auscultation bilaterally.  No wheezes or rhonchi. HEART:  Regular rate and rhythm. No murmur appreciated. ABDOMEN:  Soft, nontender.  Positive, normoactive bowel sounds. No organomegaly palpated. MSK:  No focal spinal tenderness to palpation. Full range of motion bilaterally in the upper extremities. EXTREMITIES:  No peripheral edema.   SKIN:  Clear with no obvious rashes or skin changes. No nail dyscrasia. NEURO:  Nonfocal. Well oriented.  Appropriate affect.   LABORATORY DATA:  None for this visit.  DIAGNOSTIC IMAGING:  None for this visit.      ASSESSMENT AND PLAN:  Ms.. Fox is a pleasant 67 y.o. female with Stage 1A right breast invasive ductal carcinoma, ER+/PR+/HER2-, diagnosed in November 2023, treated with lumpectomy, adjuvant radiation therapy, and  anti-estrogen therapy with anastrozole beginning in April 2024.  She presents to the Survivorship Clinic for our initial meeting and routine follow-up post-completion of treatment for breast cancer.    1. Stage 1A left breast cancer:  Ms. Horkey is continuing to recover from definitive treatment for breast cancer. She will follow-up with her medical oncologist, Dr. Dr. Pamelia Hoit in 6 months with history and physical exam per surveillance protocol.  She will continue her anti-estrogen therapy with Anastrozole. Thus far, she is tolerating the Anastrozole well, with minimal side effects. Her mammogram is due 12/2022; orders placed today.   Today, a comprehensive survivorship care plan and treatment summary was reviewed with the patient today detailing her breast cancer diagnosis, treatment course, potential late/long-term effects of treatment, appropriate follow-up care  with recommendations for the future, and patient education resources.  A copy of this summary, along with a letter will be sent to the patient's primary care provider via mail/fax/In Basket message after today's visit.    #. Problem(s) at Visit______________  #. Bone health:  Given Ms. Benthall's age/history of breast cancer and her current treatment regimen including anti-estrogen therapy with Anastrozole, she is at risk for bone demineralization.  Her last DEXA scan was 07/17/2022 and was normal.  Repeat testing is recommended in 2 years. She was given education on specific activities to promote bone health.  #. Cancer screening:  Due to Ms. Junker's history and her age, she should receive screening for skin cancers, colon cancer.  The information and recommendations are listed on the patient's comprehensive care plan/treatment summary and were reviewed in detail with the patient.    #. Health maintenance and wellness promotion: Ms. Dubach was encouraged to consume 5-7 servings of fruits and vegetables per day. We reviewed the "Nutrition Rainbow"  handout.  She was also encouraged to engage in moderate to vigorous exercise for 30 minutes per day most days of the week.  She was instructed to limit her alcohol consumption and continue to abstain from tobacco use/***was encouraged stop smoking.     #. Support services/counseling: It is not uncommon for this period of the patient's cancer care trajectory to be one of many emotions and stressors.   She was given information regarding our available services and encouraged to contact me with any questions or for help enrolling in any of our support group/programs.    Follow up instructions:    -Return to cancer center ***  -Mammogram due in *** -She is welcome to return back to the Survivorship Clinic at any time; no additional follow-up needed at this time.  -Consider referral back to survivorship as a long-term survivor for continued surveillance  The patient was provided an opportunity to ask questions and all were answered. The patient agreed with the plan and demonstrated an understanding of the instructions.   Total encounter time:*** minutes*in face-to-face visit time, chart review, lab review, care coordination, order entry, and documentation of the encounter time.    Lillard Anes, NP 08/09/22 9:07 AM Medical Oncology and Hematology [facility] [address] [place], [place] [address] Tel. [phone]    Fax. [phone]  *Total Encounter Time as defined by the Centers for Medicare and Medicaid Services includes, in addition to the face-to-face time of a patient visit (documented in the note above) non-face-to-face time: obtaining and reviewing outside history, ordering and reviewing medications, tests or procedures, care coordination (communications with other health care professionals or caregivers) and documentation in the medical record.

## 2022-08-10 ENCOUNTER — Telehealth: Payer: Self-pay | Admitting: Adult Health

## 2022-08-10 NOTE — Telephone Encounter (Signed)
Scheduled appointment per 7/10 los. Patient is aware of the made appointment. 

## 2022-09-13 ENCOUNTER — Telehealth: Payer: Self-pay

## 2022-09-13 NOTE — Telephone Encounter (Signed)
Pt called and states she declines the use of antiestrogen therapy as she feels the risk of recurrence/metastatic disease is low enough for her that she wouldn't chance going through menopause again, experiencing osteoporosis or losing her hair; therefore, she does not see the need for a follow up with Dr Pamelia Hoit as scheduled for 11/09/22. She states she would like to cancel that.  Pt was encouraged to keep appt for follow up so we can monitor her further considering her breast ca hx and risk for metastatic disease without the use of antiestrogen therapy, Pt declined a follow up visit at this time and states she would like to come back to [facility] on an as needed basis and she will call us if she needs to be seen.

## 2022-10-05 ENCOUNTER — Encounter (HOSPITAL_COMMUNITY): Payer: Self-pay | Admitting: *Deleted

## 2022-10-22 NOTE — Therapy (Signed)
OUTPATIENT PHYSICAL THERAPY  UPPER EXTREMITY ONCOLOGY EVALUATION  Patient Name: Christie Fox MRN: [id number] DOB:07-10-1955, 67 y.o., female Today's Date: 10/23/2022  END OF SESSION:  PT End of Session - 10/23/22 1121     Visit Number 1    Number of Visits 12    Date for PT Re-Evaluation 12/04/22    PT Start Time 1005    PT Stop Time 1110    PT Time Calculation (min) 65 min    Activity Tolerance Patient tolerated treatment well    Behavior During Therapy WFL for tasks assessed/performed             Past Medical History:  Diagnosis Date   Anxiety    Arthritis    knees   At risk for sleep apnea    STOP-BANG= 4    SENT TO PCP 07-31-2013   Borderline diabetes    diet controlled   Breast cancer (HCC)    Dysuria    History of kidney stones    Hyperlipidemia    Hypertension    Hypothyroidism    Migraines    PONV (postoperative nausea and vomiting)    "please call me Christie Fox to wake me up , not Christie Fox"    Renal calculus, left    Urgency of urination    Past Surgical History:  Procedure Laterality Date   BREAST LUMPECTOMY WITH RADIOACTIVE SEED LOCALIZATION Right 02/27/2022   Procedure: RIGHT BREAST LUMPECTOMY WITH RADIOACTIVE SEED LOCALIZATION X3;  Surgeon: Emelia Loron, MD;  Location: [facility];  Service: General;  Laterality: Right;   CESAREAN SECTION  x2   CYSTOSCOPY WITH RETROGRADE PYELOGRAM, URETEROSCOPY AND STENT PLACEMENT Left 08/04/2013   Procedure: 1ST STAGE CYSTOSCOPY WITH RETROGRADE PYELOGRAM, URETEROSCOPY AND STENT PLACEMENT;  Surgeon: Sebastian Ache, MD;  Location: [facility];  Service: Urology;  Laterality: Left;   CYSTOSCOPY WITH RETROGRADE PYELOGRAM, URETEROSCOPY AND STENT PLACEMENT Left 08/20/2013   Procedure: 2ND STAGE CYSTOSCOPY WITH RETROGRADE PYELOGRAM, URETEROSCOPY BASKET STONES AND STENT EXCHANGE, BLOOD MOP;  Surgeon: Sebastian Ache, MD;  Location: [facility];  Service: Urology;  Laterality: Left;   EXPLORATORY  LAPAROTOMY W/ BILATERAL SALPINOOPHORECTOMY  11-27-2001   HOLMIUM LASER APPLICATION Left 08/04/2013   Procedure: HOLMIUM LASER APPLICATION;  Surgeon: Sebastian Ache, MD;  Location: [facility];  Service: Urology;  Laterality: Left;   LAPAROSCOPIC GASTRIC SLEEVE RESECTION N/A 03/27/2017   Procedure: LAPAROSCOPIC GASTRIC SLEEVE RESECTION WITH UPPER ENDO;  Surgeon: Ovidio Kin, MD;  Location: [facility];  Service: General;  Laterality: N/A;   LEFT URETEROSCOPIC LASER LITHO STONE EXTRACTION AND STENT PLACEMENT  03-25-2005   LUMBAR FUSION  X2   LAST ONE 2000   ORIF LEFT ANKLE FX  03-20-2000   RETAINED HARDWARE   RADIOACTIVE SEED GUIDED EXCISIONAL BREAST BIOPSY Left 02/27/2022   Procedure: RADIOACTIVE SEED GUIDED EXCISIONAL LEFT BREAST BIOPSY;  Surgeon: Emelia Loron, MD;  Location: [facility];  Service: General;  Laterality: Left;   VAGINAL HYSTERECTOMY  1999   WISDOM TOOTH EXTRACTION     Patient Active Problem List   Diagnosis Date Noted   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) 12/30/2021   Hyperlipidemia 08/19/2014   Hypertension 08/19/2014   Hypothyroidism 08/19/2014   Primary osteoarthritis of both knees 08/19/2014    PCP: Christie Fairy, MD  REFERRING PROVIDER: Mancel Parsons, MD  REFERRING DIAG: Cervical and Right UE pain s/p Right breast Cancer with radiation  THERAPY DIAG:  Cervicalgia  Status post radiation  therapy  Malignant neoplasm of upper-outer quadrant of right breast in female, estrogen receptor positive (HCC)  ONSET DATE: 05/2022  Rationale for Evaluation and Treatment: Rehabilitation  SUBJECTIVE:                                                                                                                                                                                           SUBJECTIVE STATEMENT:  Pts noticed pain in her shoulders approximately a month after radiation to the right axillary region post Right breast Cancer  Surgery, and then started having throbbing pain in right UE, with tingling in right median nerve distribution. She feels a lot of tightness in the axillary region and notes swelling at the lateral trunk. She doesn't notice swelling in the right breast but the nipple gets chaffed. By 4:00 her bra has to come off. Pt has pain in bilateral UT and neck and difficulty turning. She feels weakness in her hand and has difficulty grasping things to write and carry.  PERTINENT HISTORY:  Pt with pain involving her neck and right shoulder, right arm, and right hand. Patient has a history significant for breast cancer with bilateral Lumpectomies; Right for DCIS, left with a Complex Sclerosing Lesion (Benign) and 0/1 incidental nodes. She had Adjuvant radiation on the right ending on 05/08/2022. Neck pain started 1-3 months after radiation. It is sharp, stabbing, aching and constant. Noted to have Diffuse DDD, Trace anterolisthesis at C3-C4 and C4-C5, and 2-3 mm anterolisthesis at C7-T1. Trace retrolisthesis at C6-C7,. Multilevel degenerative disc changes with disc space height loss most pronounced and moderate to severe at C5-C6 and C6-C7,Multilevel uncovertebral spurring and facet arthropathy throughout cervical spine.  PAIN:  Are you having pain? Yes NPRS scale: 8-9/10 Pain location: shoulders neck, hand right shoulder joint Pain orientation: Right  PAIN TYPE: burning, sharp, throbbing, tight, and tingling Pain description: constant  Aggravating factors: disturbed sleep, household chores,reaching with right UE,  Relieving factors: muscle relaxers some help  PRECAUTIONS: Prior foot ,ankle (rods and pins) Left LE swelling,, back surgery, gastric sleeve, hysterectomy, pain in bilateral knees  RED FLAGS: Bowel or bladder incontinence: Yes:      WEIGHT BEARING RESTRICTIONS: No  FALLS:  Has patient fallen in last 6 months? No  LIVING ENVIRONMENT: Lives with: lives with their spouse Lives in:  House/apartment   OCCUPATION: Retired  LEISURE: Chief Financial Officer [place]  HAND DOMINANCE: right   PRIOR LEVEL OF FUNCTION: Independent  PATIENT GOALS: Be able to function better   OBJECTIVE:  COGNITION: Overall cognitive status: Within functional limits for tasks assessed   PALPATION: Very tender bilateral UT, cervical  region, Bilateral pectorals and scapula greatest on the right and  right lateral trunk, medial arm, forearm  OBSERVATIONS / OTHER ASSESSMENTS: bilateral scapular protraction, swelling observed inferior to Right axilla at lateral trunk  SENSATION: Light touch:   POSTURE: forward head, rounded shoulders  UPPER EXTREMITY AROM/PROM:  A/PROM RIGHT   eval   Shoulder extension 29, pain anterior shoulder  Shoulder flexion 127 pain under arm and upper arm  Shoulder abduction 108 posterior arm  Shoulder internal rotation 60  Shoulder external rotation 83 shoulder joint    (Blank rows = not tested)  A/PROM LEFT   eval  Shoulder extension 33  Shoulder flexion 147  Shoulder abduction 143  Shoulder internal rotation 64  Shoulder external rotation 90    (Blank rows = not tested)  CERVICAL AROM: slight right All within normal limits:    Percent limited  Flexion WNL pain  post neck and bilateral UT  Extension Dec 20%, pain in R shoulder blade  Right lateral flexion Dec 15%, pain right UT/shoulderblade  Left lateral flexion WNL,Tight Right UT, pain left UT  Right rotation Decreased 50%,, pain right neck, SB   Left rotation Decreased 20% bilateral neck, SB    UPPER EXTREMITY STRENGTH:   LYMPHEDEMA ASSESSMENTS:   SURGERY TYPE/DATE: Bilateral breast lumpectomies;  NUMBER OF LYMPH NODES REMOVED: Left 0/1 incidental LN Right 0 LN's  CHEMOTHERAPY: No  RADIATION:Adjuvant ended 05/08/2022  HORMONE TREATMENT: recommended to take Anastrozole  INFECTIONS: NO   LYMPHEDEMA ASSESSMENTS:   LANDMARK RIGHT  eval  At axilla  35.1  15 cm proximal  to olecranon process 33.7  10 cm proximal to olecranon process 32.6  Olecranon process 27.3  15 cm proximal to ulnar styloid process   10 cm proximal to ulnar styloid process 22.6  Just proximal to ulnar styloid process 16.8  Across hand at thumb web space 18.9  At base of 2nd digit 6.3  (Blank rows = not tested)  LANDMARK LEFT  eval  At axilla  34.8  15 cm proximal to olecranon process 33.0  10 cm proximal to olecranon process 29.8  Olecranon process 26.0  15 cm proximal to ulnar styloid process   10 cm proximal to ulnar styloid process 21.5  Just proximal to ulnar styloid process 16.4  Across hand at thumb web space 18.3  At base of 2nd digit 6.05  (Blank rows = not tested)   FUNCTIONAL TESTS: NT; has knee pain    QUICK DASH SURVEY: 70%   TODAY'S TREATMENT:                                                                                                                                          DATE:  10/23/2022 Discussed POC including number of visits,  treatment interventions. Educated in supine AA shoulder flexion and supine stargazer, and sitting or standing scapular retraction to start gently at home  to help Right shoulder ROM. Emphasized that pain should not be in shoulder joint. Advised pt to try her compression bra for lateral trunk swelling and to wear in next visit if possible so we can assess. Also discussed a good sports bra if her compression bra is too uncomfortable    PATIENT EDUCATION:  Education details: Discussed POC including number of visits,  treatment interventions. Educated in supine AA shoulder flexion and supine stargazer, and sitting or standing scapular retraction to start gently at home to help Right shoulder ROM. Emphasized that pain should not be in shoulder joint  Person educated: Patient and Spouse Education method: Explanation and Handouts Education comprehension: verbalized understanding and returned demonstration  HOME EXERCISE  PROGRAM: Post op exercises; AA shoulder flexion, stargazer, sitting scapular retraction  ASSESSMENT:  CLINICAL IMPRESSION: Patient is a 67 y.o. female who was seen today for physical therapy evaluation and treatment . 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 Approximately 1 month s/p radiation she developed significant Cervical Pain radiating into the right greater than Left  shoulder blade,  Right arm, elbow, wrist, hand, and finger with tingling in the median nerve distribution of the hand. Pain in the arm can be burning, throbbing, tingling.She presents with limitations in Right shoulder ROM, and cervical ROM with pain ellicited. She is tight and tender in bilateral UT, posterior cervicals, pectorals and shoulder blade areas greatest on the right. She is also noted to have some right lateral trunk swelling and difficulty relaxing her arm at the side due to swelling. She will benefit from skilled therapy to address deficits and return to PLOF.      OBJECTIVE IMPAIRMENTS: decreased activity tolerance, decreased knowledge of condition, decreased ROM, decreased strength, increased edema, increased fascial restrictions, impaired sensation, impaired UE functional use, postural dysfunction, and pain.   ACTIVITY LIMITATIONS: carrying, lifting, standing, squatting, sleeping, stairs, reach over head, and hygiene/grooming  PARTICIPATION LIMITATIONS: cleaning, laundry, driving, shopping, and usual household chores  PERSONAL FACTORS: 1-2 comorbidities: Right breast Cancer s/p radiation  are also affecting patient's functional outcome.   REHAB POTENTIAL: Good  CLINICAL DECISION MAKING: Stable/uncomplicated  EVALUATION COMPLEXITY: Low  GOALS: Goals reviewed with patient? Yes  SHORT TERM GOALS: Target date: 11/13/2022  Pt will be independent in a HEP for ROM/strength of cervical and right UE regions Baseline: Goal status: INITIAL  2.  Pt will have decreased  overall pain by 25% Baseline:  Goal status: INITIAL  3.  Pt will improve Right shoulder ROM by 20 degrees for shoulder abd, and 10 degrees for flexion for improved reaching Baseline:  Goal status: INITIAL  4.  Pt will have decreased c/o tingling in the right hand by 30% Baseline:  Goal status: INITIAL  5.  Quick dash will be improved by 20% to demonstrate improved function Baseline:  Goal status: INITIAL   LONG TERM GOALS: Target date: 12/04/2022  Pt will have decreased overall pain by 50% or greater Baseline:  Goal status: INITIAL  2.  Pts quick dash will improve to no greater than 20% to demonstrate improved function Baseline:  Goal status: INITIAL  3.  Pt will have right shoulder ROM Within 10 degrees of left for improved function Baseline:  Goal status: INITIAL  4.  Pts cervical ROM will be [facility] without increased pain Baseline:  Goal status: INITIAL  5.  Pt will be able to sleep with minimal disturbance from pain Baseline:  Goal status: INITIAL  6.  Pt will noted improved ability to grasp objects, and write with right hand Baseline:  Goal status: INITIAL  PLAN:  PT FREQUENCY: 2x/week  PT DURATION: 6 weeks  PLANNED INTERVENTIONS: Therapeutic exercises, Therapeutic activity, Neuromuscular re-education, Patient/Family education, Self Care, Orthotic/Fit training, Dry Needling, Moist heat, Manual lymph drainage, Manual therapy, Re-evaluation, and    PLAN FOR NEXT SESSION: Postural education,STM bilateral UT, Right pectorals, lateral trunk, check compression bra if she wears, Review AAROM exs from eval and progress to wall walk for abd, manual traction, progress HEP as able and progress to strength when feeling better, median nerve glides,  Waynette Buttery, PT 10/23/2022, 11:23 AM

## 2022-10-23 ENCOUNTER — Other Ambulatory Visit: Payer: Self-pay

## 2022-10-23 ENCOUNTER — Ambulatory Visit: Payer: Medicare Other | Attending: Pain Medicine

## 2022-10-23 DIAGNOSIS — R293 Abnormal posture: Secondary | ICD-10-CM | POA: Insufficient documentation

## 2022-10-23 DIAGNOSIS — Z923 Personal history of irradiation: Secondary | ICD-10-CM | POA: Diagnosis not present

## 2022-10-23 DIAGNOSIS — M542 Cervicalgia: Secondary | ICD-10-CM | POA: Diagnosis present

## 2022-10-23 DIAGNOSIS — M25611 Stiffness of right shoulder, not elsewhere classified: Secondary | ICD-10-CM | POA: Diagnosis not present

## 2022-10-23 DIAGNOSIS — R600 Localized edema: Secondary | ICD-10-CM | POA: Insufficient documentation

## 2022-10-23 DIAGNOSIS — Z170 Estrogen receptor positive status [ER+]: Secondary | ICD-10-CM | POA: Diagnosis not present

## 2022-10-23 DIAGNOSIS — C50411 Malignant neoplasm of upper-outer quadrant of right female breast: Secondary | ICD-10-CM | POA: Insufficient documentation

## 2022-10-26 ENCOUNTER — Ambulatory Visit: Payer: Medicare Other

## 2022-10-26 DIAGNOSIS — R293 Abnormal posture: Secondary | ICD-10-CM

## 2022-10-26 DIAGNOSIS — M542 Cervicalgia: Secondary | ICD-10-CM | POA: Diagnosis not present

## 2022-10-26 DIAGNOSIS — C50411 Malignant neoplasm of upper-outer quadrant of right female breast: Secondary | ICD-10-CM

## 2022-10-26 DIAGNOSIS — R600 Localized edema: Secondary | ICD-10-CM

## 2022-10-26 DIAGNOSIS — Z923 Personal history of irradiation: Secondary | ICD-10-CM

## 2022-10-26 DIAGNOSIS — M25611 Stiffness of right shoulder, not elsewhere classified: Secondary | ICD-10-CM

## 2022-10-26 NOTE — Therapy (Signed)
OUTPATIENT PHYSICAL THERAPY  UPPER EXTREMITY ONCOLOGY EVALUATION  Patient Name: Christie Fox MRN: [id number] DOB:May 24, 1955, 67 y.o., female Today's Date: 10/26/2022  END OF SESSION:  PT End of Session - 10/26/22 1456     Visit Number 2    Number of Visits 12    Date for PT Re-Evaluation 12/04/22    PT Start Time 1500    PT Stop Time 1554    PT Time Calculation (min) 54 min    Activity Tolerance Patient tolerated treatment well    Behavior During Therapy WFL for tasks assessed/performed             Past Medical History:  Diagnosis Date   Anxiety    Arthritis    knees   At risk for sleep apnea    STOP-BANG= 4    SENT TO PCP 07-31-2013   Borderline diabetes    diet controlled   Breast cancer (HCC)    Dysuria    History of kidney stones    Hyperlipidemia    Hypertension    Hypothyroidism    Migraines    PONV (postoperative nausea and vomiting)    "please call me Christie to wake me up , not Christie Fox"    Renal calculus, left    Urgency of urination    Past Surgical History:  Procedure Laterality Date   BREAST LUMPECTOMY WITH RADIOACTIVE SEED LOCALIZATION Right 02/27/2022   Procedure: RIGHT BREAST LUMPECTOMY WITH RADIOACTIVE SEED LOCALIZATION X3;  Surgeon: Emelia Loron, MD;  Location: [facility];  Service: General;  Laterality: Right;   CESAREAN SECTION  x2   CYSTOSCOPY WITH RETROGRADE PYELOGRAM, URETEROSCOPY AND STENT PLACEMENT Left 08/04/2013   Procedure: 1ST STAGE CYSTOSCOPY WITH RETROGRADE PYELOGRAM, URETEROSCOPY AND STENT PLACEMENT;  Surgeon: Sebastian Ache, MD;  Location: [facility];  Service: Urology;  Laterality: Left;   CYSTOSCOPY WITH RETROGRADE PYELOGRAM, URETEROSCOPY AND STENT PLACEMENT Left 08/20/2013   Procedure: 2ND STAGE CYSTOSCOPY WITH RETROGRADE PYELOGRAM, URETEROSCOPY BASKET STONES AND STENT EXCHANGE, BLOOD MOP;  Surgeon: Sebastian Ache, MD;  Location: [facility];  Service: Urology;  Laterality: Left;   EXPLORATORY  LAPAROTOMY W/ BILATERAL SALPINOOPHORECTOMY  11-27-2001   HOLMIUM LASER APPLICATION Left 08/04/2013   Procedure: HOLMIUM LASER APPLICATION;  Surgeon: Sebastian Ache, MD;  Location: [facility];  Service: Urology;  Laterality: Left;   LAPAROSCOPIC GASTRIC SLEEVE RESECTION N/A 03/27/2017   Procedure: LAPAROSCOPIC GASTRIC SLEEVE RESECTION WITH UPPER ENDO;  Surgeon: Ovidio Kin, MD;  Location: [facility];  Service: General;  Laterality: N/A;   LEFT URETEROSCOPIC LASER LITHO STONE EXTRACTION AND STENT PLACEMENT  03-25-2005   LUMBAR FUSION  X2   LAST ONE 2000   ORIF LEFT ANKLE FX  03-20-2000   RETAINED HARDWARE   RADIOACTIVE SEED GUIDED EXCISIONAL BREAST BIOPSY Left 02/27/2022   Procedure: RADIOACTIVE SEED GUIDED EXCISIONAL LEFT BREAST BIOPSY;  Surgeon: Emelia Loron, MD;  Location: [facility];  Service: General;  Laterality: Left;   VAGINAL HYSTERECTOMY  1999   WISDOM TOOTH EXTRACTION     Patient Active Problem List   Diagnosis Date Noted   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) 12/30/2021   Hyperlipidemia 08/19/2014   Hypertension 08/19/2014   Hypothyroidism 08/19/2014   Primary osteoarthritis of both knees 08/19/2014    PCP: Brett Fairy, MD  REFERRING PROVIDER: Mancel Parsons, MD  REFERRING DIAG: Cervical and Right UE pain s/p Right breast Cancer with radiation  THERAPY DIAG:  Cervicalgia  Status post radiation  therapy  Malignant neoplasm of upper-outer quadrant of right breast in female, estrogen receptor positive (HCC)  Stiffness of right shoulder, not elsewhere classified  Localized edema  Abnormal posture  ONSET DATE: 05/2022  Rationale for Evaluation and Treatment: Rehabilitation  SUBJECTIVE:                                                                                                                                                                                           SUBJECTIVE STATEMENT:  10/26/2022  I have worn the  sports bra for 4-5 hours / day. No change with swelling yet  EVAL Pts noticed pain in her shoulders approximately a month after radiation to the right axillary region post Right breast Cancer Surgery, and then started having throbbing pain in right UE, with tingling in right median nerve distribution. She feels a lot of tightness in the axillary region and notes swelling at the lateral trunk. She doesn't notice swelling in the right breast but the nipple gets chaffed. By 4:00 her bra has to come off. Pt has pain in bilateral UT and neck and difficulty turning. She feels weakness in her hand and has difficulty grasping things to write and carry.  PERTINENT HISTORY:  Pt with pain involving her neck and right shoulder, right arm, and right hand. Patient has a history significant for breast cancer with bilateral Lumpectomies; Right for DCIS, left with a Complex Sclerosing Lesion (Benign) and 0/1 incidental nodes. She had Adjuvant radiation on the right ending on 05/08/2022. Neck pain started 1-3 months after radiation. It is sharp, stabbing, aching and constant. Noted to have Diffuse DDD, Trace anterolisthesis at C3-C4 and C4-C5, and 2-3 mm anterolisthesis at C7-T1. Trace retrolisthesis at C6-C7,. Multilevel degenerative disc changes with disc space height loss most pronounced and moderate to severe at C5-C6 and C6-C7,Multilevel uncovertebral spurring and facet arthropathy throughout cervical spine.  PAIN:  Are you having pain? Yes NPRS scale: 8-9/10 Pain location: shoulders neck, hand right shoulder joint Pain orientation: Right  PAIN TYPE: burning, sharp, throbbing, tight, and tingling Pain description: constant  Aggravating factors: disturbed sleep, household chores,reaching with right UE,  Relieving factors: muscle relaxers some help  PRECAUTIONS: Prior foot ,ankle (rods and pins) Left LE swelling,, back surgery, gastric sleeve, hysterectomy, pain in bilateral knees  RED FLAGS: Bowel or bladder  incontinence: Yes:      WEIGHT BEARING RESTRICTIONS: No  FALLS:  Has patient fallen in last 6 months? 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[place]  HAND DOMINANCE: right   PRIOR  LEVEL OF FUNCTION: Independent  PATIENT GOALS: Be able to function better   OBJECTIVE:  COGNITION: Overall cognitive status: Within functional limits for tasks assessed   PALPATION: Very tender bilateral UT, cervical region, Bilateral pectorals and scapula greatest on the right and  right lateral trunk, medial arm, forearm  OBSERVATIONS / OTHER ASSESSMENTS: bilateral scapular protraction, swelling observed inferior to Right axilla at lateral trunk  SENSATION: Light touch:   POSTURE: forward head, rounded shoulders  UPPER EXTREMITY AROM/PROM:  A/PROM RIGHT   eval   Shoulder extension 29, pain anterior shoulder  Shoulder flexion 127 pain under arm and upper arm  Shoulder abduction 108 posterior arm  Shoulder internal rotation 60  Shoulder external rotation 83 shoulder joint    (Blank rows = not tested)  A/PROM LEFT   eval  Shoulder extension 33  Shoulder flexion 147  Shoulder abduction 143  Shoulder internal rotation 64  Shoulder external rotation 90    (Blank rows = not tested)  CERVICAL AROM: slight right All within normal limits:    Percent limited  Flexion WNL pain  post neck and bilateral UT  Extension Dec 20%, pain in R shoulder blade  Right lateral flexion Dec 15%, pain right UT/shoulderblade  Left lateral flexion WNL,Tight Right UT, pain left UT  Right rotation Decreased 50%,, pain right neck, SB   Left rotation Decreased 20% bilateral neck, SB    UPPER EXTREMITY STRENGTH:   LYMPHEDEMA ASSESSMENTS:   SURGERY TYPE/DATE: Bilateral breast lumpectomies;  NUMBER OF LYMPH NODES REMOVED: Left 0/1 incidental LN Right 0 LN's  CHEMOTHERAPY:  No  RADIATION:Adjuvant ended 05/08/2022  HORMONE TREATMENT: recommended to take Anastrozole  INFECTIONS: NO   LYMPHEDEMA ASSESSMENTS:   LANDMARK RIGHT  eval  At axilla  35.1  15 cm proximal to olecranon process 33.7  10 cm proximal to olecranon process 32.6  Olecranon process 27.3  15 cm proximal to ulnar styloid process   10 cm proximal to ulnar styloid process 22.6  Just proximal to ulnar styloid process 16.8  Across hand at thumb web space 18.9  At base of 2nd digit 6.3  (Blank rows = not tested)  LANDMARK LEFT  eval  At axilla  34.8  15 cm proximal to olecranon process 33.0  10 cm proximal to olecranon process 29.8  Olecranon process 26.0  15 cm proximal to ulnar styloid process   10 cm proximal to ulnar styloid process 21.5  Just proximal to ulnar styloid process 16.4  Across hand at thumb web space 18.3  At base of 2nd digit 6.05  (Blank rows = not tested)   FUNCTIONAL TESTS: NT; has knee pain    QUICK DASH SURVEY: 70%   TODAY'S TREATMENT:  DATE:   10/26/2022  STM to bilateral UT, Right pectorals, lateral trunk with cocoa butter Gentle manual traction AAROM with hands x3, with wand x 4 flexion and scaption x 4 PROM right shoulder with multiple VC's to relax, long arm distraction Updated HEP with wand exs 10/23/2022 Discussed POC including number of visits,  treatment interventions. Educated in supine AA shoulder flexion and supine stargazer, and sitting or standing scapular retraction to start gently at home to help Right shoulder ROM. Emphasized that pain should not be in shoulder joint. Advised pt to try her compression bra for lateral trunk swelling and to wear in next visit if possible so we can assess. Also discussed a good sports bra if her compression bra is too uncomfortable    PATIENT EDUCATION:  Education  details: Discussed POC including number of visits,  treatment interventions. Educated in supine AA shoulder flexion and supine stargazer, and sitting or standing scapular retraction to start gently at home to help Right shoulder ROM. Emphasized that pain should not be in shoulder joint  Person educated: Patient and Spouse Education method: Explanation and Handouts Education comprehension: verbalized understanding and returned demonstration  HOME EXERCISE PROGRAM: Post op exercises; AA shoulder flexion, stargazer, sitting scapular retraction  ASSESSMENT:  CLINICAL IMPRESSION:  Pt was relieved after therapy that it wasn't too hard as she was very anxious. She is very tight with multiple shortened areas in the pectorals, UT and lateral trunk. She felt some relief with manual traction. She has been compliant with HEP and she has visibly improved shoulder ROM  OBJECTIVE IMPAIRMENTS: decreased activity tolerance, decreased knowledge of condition, decreased ROM, decreased strength, increased edema, increased fascial restrictions, impaired sensation, impaired UE functional use, postural dysfunction, and pain.   ACTIVITY LIMITATIONS: carrying, lifting, standing, squatting, sleeping, stairs, reach over head, and hygiene/grooming  PARTICIPATION LIMITATIONS: cleaning, laundry, driving, shopping, and usual household chores  PERSONAL FACTORS: 1-2 comorbidities: Right breast Cancer s/p radiation  are also affecting patient's functional outcome.   REHAB POTENTIAL: Good  CLINICAL DECISION MAKING: Stable/uncomplicated  EVALUATION COMPLEXITY: Low  GOALS: Goals reviewed with patient? Yes  SHORT TERM GOALS: Target date: 11/13/2022  Pt will be independent in a HEP for ROM/strength of cervical and right UE regions Baseline: Goal status: INITIAL  2.  Pt will have decreased overall pain by 25% Baseline:  Goal status: INITIAL  3.  Pt will improve Right shoulder ROM by 20 degrees for shoulder abd, and  10 degrees for flexion for improved reaching Baseline:  Goal status: INITIAL  4.  Pt will have decreased c/o tingling in the right hand by 30% Baseline:  Goal status: INITIAL  5.  Quick dash will be improved by 20% to demonstrate improved function Baseline:  Goal status: INITIAL   LONG TERM GOALS: Target date: 12/04/2022  Pt will have decreased overall pain by 50% or greater Baseline:  Goal status: INITIAL  2.  Pts quick dash will improve to no greater than 20% to demonstrate improved function Baseline:  Goal status: INITIAL  3.  Pt will have right shoulder ROM Within 10 degrees of left for improved function Baseline:  Goal status: INITIAL  4.  Pts cervical ROM will be [facility] without increased pain Baseline:  Goal status: INITIAL  5.  Pt will be able to sleep with minimal disturbance from pain Baseline:  Goal status: INITIAL  6.  Pt will noted improved ability to grasp objects, and write with right hand Baseline:  Goal status: INITIAL  PLAN:  PT FREQUENCY: 2x/week  PT DURATION: 6 weeks  PLANNED INTERVENTIONS: Therapeutic exercises, Therapeutic activity, Neuromuscular re-education, Patient/Family education, Self Care, Orthotic/Fit training, Dry Needling, Moist heat, Manual lymph drainage, Manual therapy, Re-evaluation, and    PLAN FOR NEXT SESSION: Wall walk next,Postural education,STM bilateral UT, Right pectorals, lateral trunk, check compression bra if she wears, Review AAROM exs from eval and progress to wall walk for abd, manual traction, progress HEP as able and progress to strength when feeling better, median nerve glides,  Waynette Buttery, PT 10/26/2022, 3:57 PM

## 2022-10-26 NOTE — Patient Instructions (Signed)
SHOULDER: Flexion - Supine (Cane)        Cancer Rehab [phone]    Hold cane in both hands. Raise arms up overhead. Do not allow back to arch. Hold _5__ seconds. Do __5__ times; __1-2__ times a day.  Hands shoulder width  2. hands slightly wider than shoulder width : Y position

## 2022-11-01 ENCOUNTER — Ambulatory Visit: Payer: Medicare Other | Attending: Pain Medicine

## 2022-11-01 DIAGNOSIS — R600 Localized edema: Secondary | ICD-10-CM | POA: Insufficient documentation

## 2022-11-01 DIAGNOSIS — M25611 Stiffness of right shoulder, not elsewhere classified: Secondary | ICD-10-CM | POA: Insufficient documentation

## 2022-11-01 DIAGNOSIS — Z923 Personal history of irradiation: Secondary | ICD-10-CM | POA: Diagnosis present

## 2022-11-01 DIAGNOSIS — C50411 Malignant neoplasm of upper-outer quadrant of right female breast: Secondary | ICD-10-CM | POA: Insufficient documentation

## 2022-11-01 DIAGNOSIS — M542 Cervicalgia: Secondary | ICD-10-CM | POA: Insufficient documentation

## 2022-11-01 DIAGNOSIS — Z170 Estrogen receptor positive status [ER+]: Secondary | ICD-10-CM | POA: Insufficient documentation

## 2022-11-01 DIAGNOSIS — R293 Abnormal posture: Secondary | ICD-10-CM | POA: Insufficient documentation

## 2022-11-01 NOTE — Therapy (Signed)
OUTPATIENT PHYSICAL THERAPY  UPPER EXTREMITY ONCOLOGY TREATMENT  Patient Name: Christie Fox MRN: [id number] DOB:Dec 29, 1955, 67 y.o., female Today's Date: 11/01/2022  END OF SESSION:  PT End of Session - 11/01/22 1021     Visit Number 3    Number of Visits 12    Date for PT Re-Evaluation 12/04/22    PT Start Time 1006    PT Stop Time 1101    PT Time Calculation (min) 55 min    Activity Tolerance Patient tolerated treatment well    Behavior During Therapy WFL for tasks assessed/performed             Past Medical History:  Diagnosis Date   Anxiety    Arthritis    knees   At risk for sleep apnea    STOP-BANG= 4    SENT TO PCP 07-31-2013   Borderline diabetes    diet controlled   Breast cancer (HCC)    Dysuria    History of kidney stones    Hyperlipidemia    Hypertension    Hypothyroidism    Migraines    PONV (postoperative nausea and vomiting)    "please call me Quanita to wake me up , not Mrs Cappa"    Renal calculus, left    Urgency of urination    Past Surgical History:  Procedure Laterality Date   BREAST LUMPECTOMY WITH RADIOACTIVE SEED LOCALIZATION Right 02/27/2022   Procedure: RIGHT BREAST LUMPECTOMY WITH RADIOACTIVE SEED LOCALIZATION X3;  Surgeon: Emelia Loron, MD;  Location: [facility];  Service: General;  Laterality: Right;   CESAREAN SECTION  x2   CYSTOSCOPY WITH RETROGRADE PYELOGRAM, URETEROSCOPY AND STENT PLACEMENT Left 08/04/2013   Procedure: 1ST STAGE CYSTOSCOPY WITH RETROGRADE PYELOGRAM, URETEROSCOPY AND STENT PLACEMENT;  Surgeon: Sebastian Ache, MD;  Location: [facility];  Service: Urology;  Laterality: Left;   CYSTOSCOPY WITH RETROGRADE PYELOGRAM, URETEROSCOPY AND STENT PLACEMENT Left 08/20/2013   Procedure: 2ND STAGE CYSTOSCOPY WITH RETROGRADE PYELOGRAM, URETEROSCOPY BASKET STONES AND STENT EXCHANGE, BLOOD MOP;  Surgeon: Sebastian Ache, MD;  Location: [facility];  Service: Urology;  Laterality: Left;   EXPLORATORY  LAPAROTOMY W/ BILATERAL SALPINOOPHORECTOMY  11-27-2001   HOLMIUM LASER APPLICATION Left 08/04/2013   Procedure: HOLMIUM LASER APPLICATION;  Surgeon: Sebastian Ache, MD;  Location: [facility];  Service: Urology;  Laterality: Left;   LAPAROSCOPIC GASTRIC SLEEVE RESECTION N/A 03/27/2017   Procedure: LAPAROSCOPIC GASTRIC SLEEVE RESECTION WITH UPPER ENDO;  Surgeon: Ovidio Kin, MD;  Location: [facility];  Service: General;  Laterality: N/A;   LEFT URETEROSCOPIC LASER LITHO STONE EXTRACTION AND STENT PLACEMENT  03-25-2005   LUMBAR FUSION  X2   LAST ONE 2000   ORIF LEFT ANKLE FX  03-20-2000   RETAINED HARDWARE   RADIOACTIVE SEED GUIDED EXCISIONAL BREAST BIOPSY Left 02/27/2022   Procedure: RADIOACTIVE SEED GUIDED EXCISIONAL LEFT BREAST BIOPSY;  Surgeon: Emelia Loron, MD;  Location: [facility];  Service: General;  Laterality: Left;   VAGINAL HYSTERECTOMY  1999   WISDOM TOOTH EXTRACTION     Patient Active Problem List   Diagnosis Date Noted   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) 12/30/2021   Hyperlipidemia 08/19/2014   Hypertension 08/19/2014   Hypothyroidism 08/19/2014   Primary osteoarthritis of both knees 08/19/2014    PCP: Brett Fairy, MD  REFERRING PROVIDER: Mancel Parsons, MD  REFERRING DIAG: Cervical and Right UE pain s/p Right breast Cancer with radiation  THERAPY DIAG:  Cervicalgia  Status post radiation  therapy  Malignant neoplasm of upper-outer quadrant of right breast in female, estrogen receptor positive (HCC)  Stiffness of right shoulder, not elsewhere classified  Localized edema  Abnormal posture  ONSET DATE: 05/2022  Rationale for Evaluation and Treatment: Rehabilitation  SUBJECTIVE:                                                                                                                                                                                           SUBJECTIVE STATEMENT:  I've worn the compression bra  some and it's going okay. The most I can tolerate wearing it is 4-5 hours/day. It starts to become painful at that point.  I was a little sore after last visit but not more than I expected.   EVAL Pts noticed pain in her shoulders approximately a month after radiation to the right axillary region post Right breast Cancer Surgery, and then started having throbbing pain in right UE, with tingling in right median nerve distribution. She feels a lot of tightness in the axillary region and notes swelling at the lateral trunk. She doesn't notice swelling in the right breast but the nipple gets chaffed. By 4:00 her bra has to come off. Pt has pain in bilateral UT and neck and difficulty turning. She feels weakness in her hand and has difficulty grasping things to write and carry.  PERTINENT HISTORY:  Pt with pain involving her neck and right shoulder, right arm, and right hand. Patient has a history significant for breast cancer with bilateral Lumpectomies; Right for DCIS, left with a Complex Sclerosing Lesion (Benign) and 0/1 incidental nodes. She had Adjuvant radiation on the right ending on 05/08/2022. Neck pain started 1-3 months after radiation. It is sharp, stabbing, aching and constant. Noted to have Diffuse DDD, Trace anterolisthesis at C3-C4 and C4-C5, and 2-3 mm anterolisthesis at C7-T1. Trace retrolisthesis at C6-C7,. Multilevel degenerative disc changes with disc space height loss most pronounced and moderate to severe at C5-C6 and C6-C7,Multilevel uncovertebral spurring and facet arthropathy throughout cervical spine.  PAIN:  Are you having pain? Yes NPRS scale: 7/10 Pain location: shoulders neck, hand right shoulder joint Pain orientation: Right  PAIN TYPE: burning, sharp, throbbing, tight, and tingling Pain description: constant  Aggravating factors: disturbed sleep, household chores,reaching with right UE,  Relieving factors: muscle relaxers some help  PRECAUTIONS: Prior foot ,ankle (rods and  pins) Left LE swelling,, back surgery, gastric sleeve, hysterectomy, pain in bilateral knees  RED FLAGS: Bowel or bladder incontinence: Yes:      WEIGHT BEARING RESTRICTIONS: No  FALLS:  Has patient fallen in last 6 months? No  LIVING ENVIRONMENT: Lives with:  lives with their spouse Lives in: House/apartment   OCCUPATION: Retired  LEISURE: Chief Financial Officer [place]  HAND DOMINANCE: right   PRIOR LEVEL OF FUNCTION: Independent  PATIENT GOALS: Be able to function better   OBJECTIVE:  COGNITION: Overall cognitive status: Within functional limits for tasks assessed   PALPATION: Very tender bilateral UT, cervical region, Bilateral pectorals and scapula greatest on the right and  right lateral trunk, medial arm, forearm  OBSERVATIONS / OTHER ASSESSMENTS: bilateral scapular protraction, swelling observed inferior to Right axilla at lateral trunk  SENSATION: Light touch:   POSTURE: forward head, rounded shoulders  UPPER EXTREMITY AROM/PROM:  A/PROM RIGHT   eval   Shoulder extension 29, pain anterior shoulder  Shoulder flexion 127 pain under arm and upper arm  Shoulder abduction 108 posterior arm  Shoulder internal rotation 60  Shoulder external rotation 83 shoulder joint    (Blank rows = not tested)  A/PROM LEFT   eval  Shoulder extension 33  Shoulder flexion 147  Shoulder abduction 143  Shoulder internal rotation 64  Shoulder external rotation 90    (Blank rows = not tested)  CERVICAL AROM: slight right All within normal limits:    Percent limited  Flexion WNL pain  post neck and bilateral UT  Extension Dec 20%, pain in R shoulder blade  Right lateral flexion Dec 15%, pain right UT/shoulderblade  Left lateral flexion WNL,Tight Right UT, pain left UT  Right rotation Decreased 50%,, pain right neck, SB   Left rotation Decreased 20% bilateral neck, SB    UPPER EXTREMITY STRENGTH:   LYMPHEDEMA ASSESSMENTS:   SURGERY TYPE/DATE: Bilateral  breast lumpectomies;  NUMBER OF LYMPH NODES REMOVED: Left 0/1 incidental LN Right 0 LN's  CHEMOTHERAPY: No  RADIATION:Adjuvant ended 05/08/2022  HORMONE TREATMENT: recommended to take Anastrozole  INFECTIONS: NO   LYMPHEDEMA ASSESSMENTS:   LANDMARK RIGHT  eval  At axilla  35.1  15 cm proximal to olecranon process 33.7  10 cm proximal to olecranon process 32.6  Olecranon process 27.3  15 cm proximal to ulnar styloid process   10 cm proximal to ulnar styloid process 22.6  Just proximal to ulnar styloid process 16.8  Across hand at thumb web space 18.9  At base of 2nd digit 6.3  (Blank rows = not tested)  LANDMARK LEFT  eval  At axilla  34.8  15 cm proximal to olecranon process 33.0  10 cm proximal to olecranon process 29.8  Olecranon process 26.0  15 cm proximal to ulnar styloid process   10 cm proximal to ulnar styloid process 21.5  Just proximal to ulnar styloid process 16.4  Across hand at thumb web space 18.3  At base of 2nd digit 6.05  (Blank rows = not tested)   FUNCTIONAL TESTS: NT; has knee pain    QUICK DASH SURVEY: 70%   TODAY'S TREATMENT:  DATE:   11/01/22: Therapeutic Exercises Ball roll up wall into flex x 10 returning therapist demo Pulleys: Flex x 2 mins and abd x 1 min Manual Therapy STM to bilateral UT, Right pectorals, then in Lt S/L to lateral trunk with cocoa butter, also to medial scap border where tightness and trigger points palpable Scap Mobs in Lt S/L into protraction and retraction, limited scap mobility initially but this improved some during session  Gentle manual cervical traction and suboccipital release PROM right shoulder into flex, abd and D2 with scapular depression throughout and with multiple VC's to relax, long arm distraction with oscillations for relaxation during  stretches  10/26/2022  STM to bilateral UT, Right pectorals, lateral trunk with cocoa butter Gentle manual traction AAROM with hands x3, with wand x 4 flexion and scaption x 4 PROM right shoulder with multiple VC's to relax, long arm distraction Updated HEP with wand exs 10/23/2022 Discussed POC including number of visits,  treatment interventions. Educated in supine AA shoulder flexion and supine stargazer, and sitting or standing scapular retraction to start gently at home to help Right shoulder ROM. Emphasized that pain should not be in shoulder joint. Advised pt to try her compression bra for lateral trunk swelling and to wear in next visit if possible so we can assess. Also discussed a good sports bra if her compression bra is too uncomfortable    PATIENT EDUCATION:  Education details: Discussed POC including number of visits,  treatment interventions. Educated in supine AA shoulder flexion and supine stargazer, and sitting or standing scapular retraction to start gently at home to help Right shoulder ROM. Emphasized that pain should not be in shoulder joint  Person educated: Patient and Spouse Education method: Explanation and Handouts Education comprehension: verbalized understanding and returned demonstration  HOME EXERCISE PROGRAM: Post op exercises; AA shoulder flexion, stargazer, sitting scapular retraction  ASSESSMENT:  CLINICAL IMPRESSION: Progressed pt to include AA/ROM exercises with ball roll up wall and pulleys. She tolerated these well, reporting not feeling much stretch with pulleys but did benefit from muscle re ed training to work on decreasing scapular compensation. Then continued with manual therapy working to further improve her Rt shoulder ROM and decrease Rt lateral trunk tightness. This seemed improved today as pt did not have any c/o tenderness during STM. Also included her Rt medial scapula border where she reports new tenderness since working on new HEP  stretches and using those muscles more.   OBJECTIVE IMPAIRMENTS: decreased activity tolerance, decreased knowledge of condition, decreased ROM, decreased strength, increased edema, increased fascial restrictions, impaired sensation, impaired UE functional use, postural dysfunction, and pain.   ACTIVITY LIMITATIONS: carrying, lifting, standing, squatting, sleeping, stairs, reach over head, and hygiene/grooming  PARTICIPATION LIMITATIONS: cleaning, laundry, driving, shopping, and usual household chores  PERSONAL FACTORS: 1-2 comorbidities: Right breast Cancer s/p radiation  are also affecting patient's functional outcome.   REHAB POTENTIAL: Good  CLINICAL DECISION MAKING: Stable/uncomplicated  EVALUATION COMPLEXITY: Low  GOALS: Goals reviewed with patient? Yes  SHORT TERM GOALS: Target date: 11/13/2022  Pt will be independent in a HEP for ROM/strength of cervical and right UE regions Baseline: Goal status: INITIAL  2.  Pt will have decreased overall pain by 25% Baseline:  Goal status: INITIAL  3.  Pt will improve Right shoulder ROM by 20 degrees for shoulder abd, and 10 degrees for flexion for improved reaching Baseline:  Goal status: INITIAL  4.  Pt will have decreased c/o tingling in the right hand by 30%  Baseline:  Goal status: INITIAL  5.  Quick dash will be improved by 20% to demonstrate improved function Baseline:  Goal status: INITIAL   LONG TERM GOALS: Target date: 12/04/2022  Pt will have decreased overall pain by 50% or greater Baseline:  Goal status: INITIAL  2.  Pts quick dash will improve to no greater than 20% to demonstrate improved function Baseline:  Goal status: INITIAL  3.  Pt will have right shoulder ROM Within 10 degrees of left for improved function Baseline:  Goal status: INITIAL  4.  Pts cervical ROM will be [facility] without increased pain Baseline:  Goal status: INITIAL  5.  Pt will be able to sleep with minimal disturbance from  pain Baseline:  Goal status: INITIAL  6.  Pt will noted improved ability to grasp objects, and write with right hand Baseline:  Goal status: INITIAL  PLAN:  PT FREQUENCY: 2x/week  PT DURATION: 6 weeks  PLANNED INTERVENTIONS: Therapeutic exercises, Therapeutic activity, Neuromuscular re-education, Patient/Family education, Self Care, Orthotic/Fit training, Dry Needling, Moist heat, Manual lymph drainage, Manual therapy, Re-evaluation, and    PLAN FOR NEXT SESSION: Wall walk next, Postural education, STM bilateral UT, Right pectorals, lateral trunk, check compression bra if she wears, Review AAROM exs from eval and progress to wall walk for abd, manual traction, progress HEP as able and progress to strength when feeling better, median nerve glides  Hermenia Bers, PTA 11/01/2022, 11:13 AM

## 2022-11-07 ENCOUNTER — Ambulatory Visit: Payer: Medicare Other

## 2022-11-07 DIAGNOSIS — R293 Abnormal posture: Secondary | ICD-10-CM

## 2022-11-07 DIAGNOSIS — M25611 Stiffness of right shoulder, not elsewhere classified: Secondary | ICD-10-CM

## 2022-11-07 DIAGNOSIS — Z923 Personal history of irradiation: Secondary | ICD-10-CM

## 2022-11-07 DIAGNOSIS — Z170 Estrogen receptor positive status [ER+]: Secondary | ICD-10-CM

## 2022-11-07 DIAGNOSIS — M542 Cervicalgia: Secondary | ICD-10-CM | POA: Diagnosis not present

## 2022-11-07 DIAGNOSIS — R600 Localized edema: Secondary | ICD-10-CM

## 2022-11-07 NOTE — Therapy (Signed)
OUTPATIENT PHYSICAL THERAPY  UPPER EXTREMITY ONCOLOGY TREATMENT  Patient Name: Christie Fox MRN: [id number] DOB:1955-09-02, 67 y.o., female Today's Date: 11/07/2022  END OF SESSION:  PT End of Session - 11/07/22 0750     Visit Number 4    Number of Visits 12    Date for PT Re-Evaluation 12/04/22    PT Start Time 0800    PT Stop Time 0854    PT Time Calculation (min) 54 min    Activity Tolerance Patient tolerated treatment well    Behavior During Therapy [facility] for tasks assessed/performed             Past Medical History:  Diagnosis Date   Anxiety    Arthritis    knees   At risk for sleep apnea    STOP-BANG= 4    SENT TO PCP 07-31-2013   Borderline diabetes    diet controlled   Breast cancer (HCC)    Dysuria    History of kidney stones    Hyperlipidemia    Hypertension    Hypothyroidism    Migraines    PONV (postoperative nausea and vomiting)    "please call me Christie Fox to wake me up , not Mrs Goodnow"    Renal calculus, left    Urgency of urination    Past Surgical History:  Procedure Laterality Date   BREAST LUMPECTOMY WITH RADIOACTIVE SEED LOCALIZATION Right 02/27/2022   Procedure: RIGHT BREAST LUMPECTOMY WITH RADIOACTIVE SEED LOCALIZATION X3;  Surgeon: Emelia Loron, MD;  Location: [facility];  Service: General;  Laterality: Right;   CESAREAN SECTION  x2   CYSTOSCOPY WITH RETROGRADE PYELOGRAM, URETEROSCOPY AND STENT PLACEMENT Left 08/04/2013   Procedure: 1ST STAGE CYSTOSCOPY WITH RETROGRADE PYELOGRAM, URETEROSCOPY AND STENT PLACEMENT;  Surgeon: Sebastian Ache, MD;  Location: [facility];  Service: Urology;  Laterality: Left;   CYSTOSCOPY WITH RETROGRADE PYELOGRAM, URETEROSCOPY AND STENT PLACEMENT Left 08/20/2013   Procedure: 2ND STAGE CYSTOSCOPY WITH RETROGRADE PYELOGRAM, URETEROSCOPY BASKET STONES AND STENT EXCHANGE, BLOOD MOP;  Surgeon: Sebastian Ache, MD;  Location: [facility];  Service: Urology;  Laterality: Left;   EXPLORATORY  LAPAROTOMY W/ BILATERAL SALPINOOPHORECTOMY  11-27-2001   HOLMIUM LASER APPLICATION Left 08/04/2013   Procedure: HOLMIUM LASER APPLICATION;  Surgeon: Sebastian Ache, MD;  Location: [facility];  Service: Urology;  Laterality: Left;   LAPAROSCOPIC GASTRIC SLEEVE RESECTION N/A 03/27/2017   Procedure: LAPAROSCOPIC GASTRIC SLEEVE RESECTION WITH UPPER ENDO;  Surgeon: Ovidio Kin, MD;  Location: [facility];  Service: General;  Laterality: N/A;   LEFT URETEROSCOPIC LASER LITHO STONE EXTRACTION AND STENT PLACEMENT  03-25-2005   LUMBAR FUSION  X2   LAST ONE 2000   ORIF LEFT ANKLE FX  03-20-2000   RETAINED HARDWARE   RADIOACTIVE SEED GUIDED EXCISIONAL BREAST BIOPSY Left 02/27/2022   Procedure: RADIOACTIVE SEED GUIDED EXCISIONAL LEFT BREAST BIOPSY;  Surgeon: Emelia Loron, MD;  Location: [facility];  Service: General;  Laterality: Left;   VAGINAL HYSTERECTOMY  1999   WISDOM TOOTH EXTRACTION     Patient Active Problem List   Diagnosis Date Noted   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) 12/30/2021   Hyperlipidemia 08/19/2014   Hypertension 08/19/2014   Hypothyroidism 08/19/2014   Primary osteoarthritis of both knees 08/19/2014    PCP: Brett Fairy, MD  REFERRING PROVIDER: Mancel Parsons, MD  REFERRING DIAG: Cervical and Right UE pain s/p Right breast Cancer with radiation  THERAPY DIAG:  Cervicalgia  Status post radiation  therapy  Malignant neoplasm of upper-outer quadrant of right breast in female, estrogen receptor positive (HCC)  Stiffness of right shoulder, not elsewhere classified  Localized edema  Abnormal posture  ONSET DATE: 05/2022  Rationale for Evaluation and Treatment: Rehabilitation  SUBJECTIVE:                                                                                                                                                                                           SUBJECTIVE STATEMENT:  11/07/2022 My neck and shoulder  feels tight, and shoulder blades. I think its because I am doing my exercises. I feel like my ROM is improving.  My fingers 2-4 still feel like the blood isn't circulating. The arm throbbing is improved. I have only had 2 episodes of that. I had a mammogram last week and now I have to go for a Biopsy on my left breast tomorrow. I can blow dry my hair now. I can sit with my grandchild in my lap and look at a book now and trying to sit tall.  EVAL Pts noticed pain in her shoulders approximately a month after radiation to the right axillary region post Right breast Cancer Surgery, and then started having throbbing pain in right UE, with tingling in right median nerve distribution. She feels a lot of tightness in the axillary region and notes swelling at the lateral trunk. She doesn't notice swelling in the right breast but the nipple gets chaffed. By 4:00 her bra has to come off. Pt has pain in bilateral UT and neck and difficulty turning. She feels weakness in her hand and has difficulty grasping things to write and carry.  PERTINENT HISTORY:  Pt with pain involving her neck and right shoulder, right arm, and right hand. Patient has a history significant for breast cancer with bilateral Lumpectomies; Right for DCIS, left with a Complex Sclerosing Lesion (Benign) and 0/1 incidental nodes. She had Adjuvant radiation on the right ending on 05/08/2022. Neck pain started 1-3 months after radiation. It is sharp, stabbing, aching and constant. Noted to have Diffuse DDD, Trace anterolisthesis at C3-C4 and C4-C5, and 2-3 mm anterolisthesis at C7-T1. Trace retrolisthesis at C6-C7,. Multilevel degenerative disc changes with disc space height loss most pronounced and moderate to severe at C5-C6 and C6-C7,Multilevel uncovertebral spurring and facet arthropathy throughout cervical spine.  PAIN:  Are you having pain? Yes NPRS scale: 0- 7/10 intermittent with activity Pain location: shoulders neck, hand right shoulder  joint Pain orientation: Right  PAIN TYPE: burning, sharp, throbbing, tight, and tingling Pain description:intermittent Aggravating factors: disturbed sleep, household chores,reaching with right UE,  Relieving factors: muscle relaxers  some help  PRECAUTIONS: Prior foot ,ankle (rods and pins) Left LE swelling,, back surgery, gastric sleeve, hysterectomy, pain in bilateral knees  RED FLAGS: Bowel or bladder incontinence: Yes:      WEIGHT BEARING RESTRICTIONS: No  FALLS:  Has patient fallen in last 6 months? 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[place]  HAND DOMINANCE: right   PRIOR LEVEL OF FUNCTION: Independent  PATIENT GOALS: Be able to function better   OBJECTIVE:  COGNITION: Overall cognitive status: Within functional limits for tasks assessed   PALPATION: Very tender bilateral UT, cervical region, Bilateral pectorals and scapula greatest on the right and  right lateral trunk, medial arm, forearm  OBSERVATIONS / OTHER ASSESSMENTS: bilateral scapular protraction, swelling observed inferior to Right axilla at lateral trunk  SENSATION: Light touch:   POSTURE: forward head, rounded shoulders  UPPER EXTREMITY AROM/PROM:  A/PROM RIGHT   eval  RIGHT 11/07/2022  Shoulder extension 29, pain anterior shoulder 50  Shoulder flexion 127 pain under arm and upper arm 152  Shoulder abduction 108 posterior arm 155  Shoulder internal rotation 60   Shoulder external rotation 83 shoulder joint     (Blank rows = not tested)  A/PROM LEFT   eval  Shoulder extension 33  Shoulder flexion 147  Shoulder abduction 143  Shoulder internal rotation 64  Shoulder external rotation 90    (Blank rows = not tested)  CERVICAL AROM: slight right All within normal limits:    Percent limited  Flexion WNL pain  post neck and bilateral UT  Extension Dec 20%, pain in R shoulder  blade  Right lateral flexion Dec 15%, pain right UT/shoulderblade  Left lateral flexion WNL,Tight Right UT, pain left UT  Right rotation Decreased 50%,, pain right neck, SB   Left rotation Decreased 20% bilateral neck, SB    UPPER EXTREMITY STRENGTH:   LYMPHEDEMA ASSESSMENTS:   SURGERY TYPE/DATE: Bilateral breast lumpectomies;  NUMBER OF LYMPH NODES REMOVED: Left 0/1 incidental LN Right 0 LN's  CHEMOTHERAPY: No  RADIATION:Adjuvant ended 05/08/2022  HORMONE TREATMENT: recommended to take Anastrozole  INFECTIONS: NO   LYMPHEDEMA ASSESSMENTS:   LANDMARK RIGHT  eval  At axilla  35.1  15 cm proximal to olecranon process 33.7  10 cm proximal to olecranon process 32.6  Olecranon process 27.3  15 cm proximal to ulnar styloid process   10 cm proximal to ulnar styloid process 22.6  Just proximal to ulnar styloid process 16.8  Across hand at thumb web space 18.9  At base of 2nd digit 6.3  (Blank rows = not tested)  LANDMARK LEFT  eval  At axilla  34.8  15 cm proximal to olecranon process 33.0  10 cm proximal to olecranon process 29.8  Olecranon process 26.0  15 cm proximal to ulnar styloid process   10 cm proximal to ulnar styloid process 21.5  Just proximal to ulnar styloid process 16.4  Across hand at thumb web space 18.3  At base of 2nd digit 6.05  (Blank rows = not tested)   FUNCTIONAL TESTS: NT; has knee pain    QUICK DASH SURVEY: 70%   TODAY'S TREATMENT:  DATE:   11/07/2022 Pulleys flexion and abduction x 2 min Wall arc bilaterally x 3 Stargazer and AAflexion x 5 STM to bilateral UT, Right pectorals, then in Lt S/L to lateral trunk with cocoa butter, also to medial scap border where tightness and trigger points palpable Gentle manual cervical traction and suboccipital release PROM right shoulder into flex, abd and D2  with scapular depression throughout   11/01/22: Therapeutic Exercises Ball roll up wall into flex x 10 returning therapist demo Pulleys: Flex x 2 mins and abd x 1 min Manual Therapy STM to bilateral UT, Right pectorals, then in Lt S/L to lateral trunk with cocoa butter, also to medial scap border where tightness and trigger points palpable Scap Mobs in Lt S/L into protraction and retraction, limited scap mobility initially but this improved some during session  Gentle manual cervical traction and suboccipital release PROM right shoulder into flex, abd and D2 with scapular depression throughout and with multiple VC's to relax, long arm distraction with oscillations for relaxation during stretches  10/26/2022  STM to bilateral UT, Right pectorals, lateral trunk with cocoa butter Gentle manual traction AAROM with hands x3, with wand x 4 flexion and scaption x 4 PROM right shoulder with multiple VC's to relax, long arm distraction Updated HEP with wand exs 10/23/2022 Discussed POC including number of visits,  treatment interventions. Educated in supine AA shoulder flexion and supine stargazer, and sitting or standing scapular retraction to start gently at home to help Right shoulder ROM. Emphasized that pain should not be in shoulder joint. Advised pt to try her compression bra for lateral trunk swelling and to wear in next visit if possible so we can assess. Also discussed a good sports bra if her compression bra is too uncomfortable    PATIENT EDUCATION:  Education details: Discussed POC including number of visits,  treatment interventions. Educated in supine AA shoulder flexion and supine stargazer, and sitting or standing scapular retraction to start gently at home to help Right shoulder ROM. Emphasized that pain should not be in shoulder joint  Person educated: Patient and Spouse Education method: Explanation and Handouts Education comprehension: verbalized understanding and returned  demonstration  HOME EXERCISE PROGRAM: Post op exercises; AA shoulder flexion, stargazer, sitting scapular retraction  ASSESSMENT:  CLINICAL IMPRESSION: Overall pain improved and no longer constant. Pt has been compliant with HEP and she has  improved immensely  with AROM.   OBJECTIVE IMPAIRMENTS: decreased activity tolerance, decreased knowledge of condition, decreased ROM, decreased strength, increased edema, increased fascial restrictions, impaired sensation, impaired UE functional use, postural dysfunction, and pain.   ACTIVITY LIMITATIONS: carrying, lifting, standing, squatting, sleeping, stairs, reach over head, and hygiene/grooming  PARTICIPATION LIMITATIONS: cleaning, laundry, driving, shopping, and usual household chores  PERSONAL FACTORS: 1-2 comorbidities: Right breast Cancer s/p radiation  are also affecting patient's functional outcome.   REHAB POTENTIAL: Good  CLINICAL DECISION MAKING: Stable/uncomplicated  EVALUATION COMPLEXITY: Low  GOALS: Goals reviewed with patient? Yes  SHORT TERM GOALS: Target date: 11/13/2022  Pt will be independent in a HEP for ROM/strength of cervical and right UE regions Baseline: Goal status: INITIAL  2.  Pt will have decreased overall pain by 25% Baseline:  Goal status: INITIAL  3.  Pt will improve Right shoulder ROM by 20 degrees for shoulder abd, and 10 degrees for flexion for improved reaching Baseline:  Goal status: INITIAL  4.  Pt will have decreased c/o tingling in the right hand by 30% Baseline:  Goal status: INITIAL  5.  Quick dash will be improved by 20% to demonstrate improved function Baseline:  Goal status: INITIAL   LONG TERM GOALS: Target date: 12/04/2022  Pt will have decreased overall pain by 50% or greater Baseline:  Goal status: INITIAL  2.  Pts quick dash will improve to no greater than 20% to demonstrate improved function Baseline:  Goal status: INITIAL  3.  Pt will have right shoulder ROM Within  10 degrees of left for improved function Baseline:  Goal status: INITIAL  4.  Pts cervical ROM will be [facility] without increased pain Baseline:  Goal status: INITIAL  5.  Pt will be able to sleep with minimal disturbance from pain Baseline:  Goal status: INITIAL  6.  Pt will noted improved ability to grasp objects, and write with right hand Baseline:  Goal status: INITIAL  PLAN:  PT FREQUENCY: 2x/week  PT DURATION: 6 weeks  PLANNED INTERVENTIONS: Therapeutic exercises, Therapeutic activity, Neuromuscular re-education, Patient/Family education, Self Care, Orthotic/Fit training, Dry Needling, Moist heat, Manual lymph drainage, Manual therapy, Re-evaluation, and    PLAN FOR NEXT SESSION: Wall walk next, Postural education, STM bilateral UT, Right pectorals, lateral trunk, check compression bra if she wears, Review AAROM exs from eval and progress to wall walk for abd, manual traction, progress HEP as able and progress to strength when feeling better, median nerve glides  Waynette Buttery, PT 11/07/2022, 8:55 AM

## 2022-11-08 ENCOUNTER — Other Ambulatory Visit: Payer: Self-pay

## 2022-11-09 ENCOUNTER — Ambulatory Visit: Payer: Medicare Other | Admitting: Hematology and Oncology

## 2022-11-09 ENCOUNTER — Ambulatory Visit: Payer: Medicare Other

## 2022-11-09 LAB — SURGICAL PATHOLOGY

## 2022-11-13 ENCOUNTER — Ambulatory Visit: Payer: Medicare Other

## 2022-11-13 DIAGNOSIS — M25611 Stiffness of right shoulder, not elsewhere classified: Secondary | ICD-10-CM

## 2022-11-13 DIAGNOSIS — M542 Cervicalgia: Secondary | ICD-10-CM

## 2022-11-13 DIAGNOSIS — Z923 Personal history of irradiation: Secondary | ICD-10-CM

## 2022-11-13 DIAGNOSIS — R600 Localized edema: Secondary | ICD-10-CM

## 2022-11-13 DIAGNOSIS — R293 Abnormal posture: Secondary | ICD-10-CM

## 2022-11-13 DIAGNOSIS — C50411 Malignant neoplasm of upper-outer quadrant of right female breast: Secondary | ICD-10-CM

## 2022-11-13 NOTE — Therapy (Signed)
OUTPATIENT PHYSICAL THERAPY  UPPER EXTREMITY ONCOLOGY TREATMENT  Patient Name: Christie Fox MRN: [id number] DOB:1955/05/28, 67 y.o., female Today's Date: 11/13/2022  END OF SESSION:  PT End of Session - 11/13/22 1403     Visit Number 5    Number of Visits 12    Date for PT Re-Evaluation 12/04/22    PT Start Time 1404    PT Stop Time 1501    PT Time Calculation (min) 57 min    Activity Tolerance Patient tolerated treatment well             Past Medical History:  Diagnosis Date   Anxiety    Arthritis    knees   At risk for sleep apnea    STOP-BANG= 4    SENT TO PCP 07-31-2013   Borderline diabetes    diet controlled   Breast cancer (HCC)    Dysuria    History of kidney stones    Hyperlipidemia    Hypertension    Hypothyroidism    Migraines    PONV (postoperative nausea and vomiting)    "please call me Larysa to wake me up , not Mrs Kosman"    Renal calculus, left    Urgency of urination    Past Surgical History:  Procedure Laterality Date   BREAST LUMPECTOMY WITH RADIOACTIVE SEED LOCALIZATION Right 02/27/2022   Procedure: RIGHT BREAST LUMPECTOMY WITH RADIOACTIVE SEED LOCALIZATION X3;  Surgeon: Emelia Loron, MD;  Location: [facility];  Service: General;  Laterality: Right;   CESAREAN SECTION  x2   CYSTOSCOPY WITH RETROGRADE PYELOGRAM, URETEROSCOPY AND STENT PLACEMENT Left 08/04/2013   Procedure: 1ST STAGE CYSTOSCOPY WITH RETROGRADE PYELOGRAM, URETEROSCOPY AND STENT PLACEMENT;  Surgeon: Sebastian Ache, MD;  Location: [facility];  Service: Urology;  Laterality: Left;   CYSTOSCOPY WITH RETROGRADE PYELOGRAM, URETEROSCOPY AND STENT PLACEMENT Left 08/20/2013   Procedure: 2ND STAGE CYSTOSCOPY WITH RETROGRADE PYELOGRAM, URETEROSCOPY BASKET STONES AND STENT EXCHANGE, BLOOD MOP;  Surgeon: Sebastian Ache, MD;  Location: [facility];  Service: Urology;  Laterality: Left;   EXPLORATORY LAPAROTOMY W/ BILATERAL SALPINOOPHORECTOMY  11-27-2001    HOLMIUM LASER APPLICATION Left 08/04/2013   Procedure: HOLMIUM LASER APPLICATION;  Surgeon: Sebastian Ache, MD;  Location: [facility];  Service: Urology;  Laterality: Left;   LAPAROSCOPIC GASTRIC SLEEVE RESECTION N/A 03/27/2017   Procedure: LAPAROSCOPIC GASTRIC SLEEVE RESECTION WITH UPPER ENDO;  Surgeon: Ovidio Kin, MD;  Location: [facility];  Service: General;  Laterality: N/A;   LEFT URETEROSCOPIC LASER LITHO STONE EXTRACTION AND STENT PLACEMENT  03-25-2005   LUMBAR FUSION  X2   LAST ONE 2000   ORIF LEFT ANKLE FX  03-20-2000   RETAINED HARDWARE   RADIOACTIVE SEED GUIDED EXCISIONAL BREAST BIOPSY Left 02/27/2022   Procedure: RADIOACTIVE SEED GUIDED EXCISIONAL LEFT BREAST BIOPSY;  Surgeon: Emelia Loron, MD;  Location:  [facility];  Service: General;  Laterality: Left;   VAGINAL HYSTERECTOMY  1999   WISDOM TOOTH EXTRACTION     Patient Active Problem List   Diagnosis Date Noted   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) 12/30/2021   Hyperlipidemia 08/19/2014   Hypertension 08/19/2014   Hypothyroidism 08/19/2014   Primary osteoarthritis of both knees 08/19/2014    PCP: Brett Fairy, MD  REFERRING PROVIDER: Mancel Parsons, MD  REFERRING DIAG: Cervical and Right UE pain s/p Right breast Cancer with radiation  THERAPY DIAG:  Cervicalgia  Status post radiation therapy  Malignant neoplasm of upper-outer quadrant of right breast  in female, estrogen receptor positive (HCC)  Stiffness of right shoulder, not elsewhere classified  Localized edema  Abnormal posture  ONSET DATE: 05/2022  Rationale for Evaluation and Treatment: Rehabilitation  SUBJECTIVE:                                                                                                                                                                                           SUBJECTIVE STATEMENT:  11/07/2022 I had a biopsy last week and I just found out today that everything is OK. You  are going to feel lots of knots today. I was really tight and stressed due to anxiety and now my surgery will have to be later with Dr. Leta Baptist.  EVAL Pts noticed pain in her shoulders approximately a month after radiation to the right axillary region post Right breast Cancer Surgery, and then started having throbbing pain in right UE, with tingling in right median nerve distribution. She feels a lot of tightness in the axillary region and notes swelling at the lateral trunk. She doesn't notice swelling in the right breast but the nipple gets chaffed. By 4:00 her bra has to come off. Pt has pain in bilateral UT and neck and difficulty turning. She feels weakness in her hand and has difficulty grasping things to write and carry.  PERTINENT HISTORY:  Pt with pain involving her neck and right shoulder, right arm, and right hand. Patient has a history significant for breast cancer with bilateral Lumpectomies; Right for DCIS, left with a Complex Sclerosing Lesion (Benign) and 0/1 incidental nodes. She had Adjuvant radiation on the right ending on 05/08/2022. Neck pain started 1-3 months after radiation. It is sharp, stabbing, aching and constant. Noted to have Diffuse DDD, Trace anterolisthesis at C3-C4 and C4-C5, and 2-3 mm anterolisthesis at C7-T1. Trace retrolisthesis at C6-C7,. Multilevel degenerative disc changes with disc space height loss most pronounced and moderate to severe at C5-C6 and C6-C7,Multilevel uncovertebral spurring and facet arthropathy throughout cervical spine.  PAIN:  Are you having pain? Yes NPRS scale: 9/10 due to anxiety Pain location: shoulders neck, hand right shoulder joint Pain orientation: Right  PAIN TYPE: burning, sharp, throbbing, tight, and tingling Pain description:intermittent Aggravating factors: disturbed sleep, household chores,reaching with right UE,  Relieving factors: muscle relaxers some help  PRECAUTIONS: Prior foot ,ankle (rods and pins) Left LE swelling,,  back surgery, gastric sleeve, hysterectomy, pain in bilateral knees  RED FLAGS: Bowel or bladder incontinence: Yes:      WEIGHT BEARING RESTRICTIONS: No  FALLS:  Has patient fallen in last 6 months? No  LIVING ENVIRONMENT: Lives with: lives with their spouse Lives in:  House/apartment   OCCUPATION: Retired  LEISURE: Chief Financial Officer [place]  HAND DOMINANCE: right   PRIOR LEVEL OF FUNCTION: Independent  PATIENT GOALS: Be able to function better   OBJECTIVE:  COGNITION: Overall cognitive status: Within functional limits for tasks assessed   PALPATION: Very tender bilateral UT, cervical region, Bilateral pectorals and scapula greatest on the right and  right lateral trunk, medial arm, forearm  OBSERVATIONS / OTHER ASSESSMENTS: bilateral scapular protraction, swelling observed inferior to Right axilla at lateral trunk  SENSATION: Light touch:   POSTURE: forward head, rounded shoulders  UPPER EXTREMITY AROM/PROM:  A/PROM RIGHT   eval  RIGHT 11/07/2022  Shoulder extension 29, pain anterior shoulder 50  Shoulder flexion 127 pain under arm and upper arm 152  Shoulder abduction 108 posterior arm 155  Shoulder internal rotation 60   Shoulder external rotation 83 shoulder joint     (Blank rows = not tested)  A/PROM LEFT   eval  Shoulder extension 33  Shoulder flexion 147  Shoulder abduction 143  Shoulder internal rotation 64  Shoulder external rotation 90    (Blank rows = not tested)  CERVICAL AROM: slight right All within normal limits:    Percent limited  Flexion WNL pain  post neck and bilateral UT  Extension Dec 20%, pain in R shoulder blade  Right lateral flexion Dec 15%, pain right UT/shoulderblade  Left lateral flexion WNL,Tight Right UT, pain left UT  Right rotation Decreased 50%,, pain right neck, SB   Left rotation Decreased 20% bilateral neck, SB    UPPER EXTREMITY STRENGTH:   LYMPHEDEMA ASSESSMENTS:   SURGERY TYPE/DATE: Bilateral  breast lumpectomies;  NUMBER OF LYMPH NODES REMOVED: Left 0/1 incidental LN Right 0 LN's  CHEMOTHERAPY: No  RADIATION:Adjuvant ended 05/08/2022  HORMONE TREATMENT: recommended to take Anastrozole  INFECTIONS: NO   LYMPHEDEMA ASSESSMENTS:   LANDMARK RIGHT  eval  At axilla  35.1  15 cm proximal to olecranon process 33.7  10 cm proximal to olecranon process 32.6  Olecranon process 27.3  15 cm proximal to ulnar styloid process   10 cm proximal to ulnar styloid process 22.6  Just proximal to ulnar styloid process 16.8  Across hand at thumb web space 18.9  At base of 2nd digit 6.3  (Blank rows = not tested)  LANDMARK LEFT  eval  At axilla  34.8  15 cm proximal to olecranon process 33.0  10 cm proximal to olecranon process 29.8  Olecranon process 26.0  15 cm proximal to ulnar styloid process   10 cm proximal to ulnar styloid process 21.5  Just proximal to ulnar styloid process 16.4  Across hand at thumb web space 18.3  At base of 2nd digit 6.05  (Blank rows = not tested)   FUNCTIONAL TESTS: NT; has knee pain    QUICK DASH SURVEY: 70%   TODAY'S TREATMENT:  DATE:  11/13/2022 STM to bilateral UT, pectorals, lateral trunk with cocoa butter Manual traction, suboccipital release PROM bilateral shoulders flex, abd, D2 flexion, IR and Erwith VC's to relax Supine AROM bilateral shoulder flexion, scaption, horizontal abd x 5  11/07/2022 Pulleys flexion and abduction x 2 min Wall arc bilaterally x 3 Stargazer and AAflexion x 5 STM to bilateral UT, Right pectorals, bilateral lateral trunk PROM right shoulder into flex, abd and D2 with scapular depression throughout   11/01/22: Therapeutic Exercises Ball roll up wall into flex x 10 returning therapist demo Pulleys: Flex x 2 mins and abd x 1 min Manual Therapy STM to bilateral UT, Right  pectorals, then in Lt S/L to lateral trunk with cocoa butter, also to medial scap border where tightness and trigger points palpable Scap Mobs in Lt S/L into protraction and retraction, limited scap mobility initially but this improved some during session  Gentle manual cervical traction and suboccipital release PROM right shoulder into flex, abd and D2 with scapular depression throughout and with multiple VC's to relax, long arm distraction with oscillations for relaxation during stretches  10/26/2022  STM to bilateral UT, Right pectorals, lateral trunk with cocoa butter Gentle manual traction AAROM with hands x3, with wand x 4 flexion and scaption x 4 PROM right shoulder with multiple VC's to relax, long arm distraction Updated HEP with wand exs 10/23/2022 Discussed POC including number of visits,  treatment interventions. Educated in supine AA shoulder flexion and supine stargazer, and sitting or standing scapular retraction to start gently at home to help Right shoulder ROM. Emphasized that pain should not be in shoulder joint. Advised pt to try her compression bra for lateral trunk swelling and to wear in next visit if possible so we can assess. Also discussed a good sports bra if her compression bra is too uncomfortable    PATIENT EDUCATION:  Education details: Discussed POC including number of visits,  treatment interventions. Educated in supine AA shoulder flexion and supine stargazer, and sitting or standing scapular retraction to start gently at home to help Right shoulder ROM. Emphasized that pain should not be in shoulder joint  Person educated: Patient and Spouse Education method: Explanation and Handouts Education comprehension: verbalized understanding and returned demonstration  HOME EXERCISE PROGRAM: Post op exercises; AA shoulder flexion, stargazer, sitting scapular retraction  ASSESSMENT:  CLINICAL IMPRESSION: Pt came in at a 9/10 pain level, and felt better afterward  but still a 7/10 due to stress. Multiple shortened areas in UT, suboccipitals, pectorals and lateral trunk. Pt required VC's to relax for PROM. ROM continues to do well.  OBJECTIVE IMPAIRMENTS: decreased activity tolerance, decreased knowledge of condition, decreased ROM, decreased strength, increased edema, increased fascial restrictions, impaired sensation, impaired UE functional use, postural dysfunction, and pain.   ACTIVITY LIMITATIONS: carrying, lifting, standing, squatting, sleeping, stairs, reach over head, and hygiene/grooming  PARTICIPATION LIMITATIONS: cleaning, laundry, driving, shopping, and usual household chores  PERSONAL FACTORS: 1-2 comorbidities: Right breast Cancer s/p radiation  are also affecting patient's functional outcome.   REHAB POTENTIAL: Good  CLINICAL DECISION MAKING: Stable/uncomplicated  EVALUATION COMPLEXITY: Low  GOALS: Goals reviewed with patient? Yes  SHORT TERM GOALS: Target date: 11/13/2022  Pt will be independent in a HEP for ROM/strength of cervical and right UE regions Baseline: Goal status: INITIAL  2.  Pt will have decreased overall pain by 25% Baseline:  Goal status: INITIAL  3.  Pt will improve Right shoulder ROM by 20 degrees for shoulder abd, and 10 degrees  for flexion for improved reaching Baseline:  Goal status: INITIAL  4.  Pt will have decreased c/o tingling in the right hand by 30% Baseline:  Goal status: INITIAL  5.  Quick dash will be improved by 20% to demonstrate improved function Baseline:  Goal status: INITIAL   LONG TERM GOALS: Target date: 12/04/2022  Pt will have decreased overall pain by 50% or greater Baseline:  Goal status: INITIAL  2.  Pts quick dash will improve to no greater than 20% to demonstrate improved function Baseline:  Goal status: INITIAL  3.  Pt will have right shoulder ROM Within 10 degrees of left for improved function Baseline:  Goal status: INITIAL  4.  Pts cervical ROM will be [facility]  without increased pain Baseline:  Goal status: INITIAL  5.  Pt will be able to sleep with minimal disturbance from pain Baseline:  Goal status: INITIAL  6.  Pt will noted improved ability to grasp objects, and write with right hand Baseline:  Goal status: INITIAL  PLAN:  PT FREQUENCY: 2x/week  PT DURATION: 6 weeks  PLANNED INTERVENTIONS: Therapeutic exercises, Therapeutic activity, Neuromuscular re-education, Patient/Family education, Self Care, Orthotic/Fit training, Dry Needling, Moist heat, Manual lymph drainage, Manual therapy, Re-evaluation, and    PLAN FOR NEXT SESSION: Wall walk next, Postural education, STM bilateral UT, Right pectorals, lateral trunk, check compression bra if she wears, Review AAROM exs from eval and progress to wall walk for abd, manual traction, progress HEP as able and progress to strength when feeling better, median nerve glides  Waynette Buttery, PT 11/13/2022, 3:03 PM

## 2022-11-15 ENCOUNTER — Ambulatory Visit: Payer: Medicare Other

## 2022-11-15 DIAGNOSIS — M542 Cervicalgia: Secondary | ICD-10-CM

## 2022-11-15 DIAGNOSIS — R600 Localized edema: Secondary | ICD-10-CM

## 2022-11-15 DIAGNOSIS — R293 Abnormal posture: Secondary | ICD-10-CM

## 2022-11-15 DIAGNOSIS — Z923 Personal history of irradiation: Secondary | ICD-10-CM

## 2022-11-15 DIAGNOSIS — M25611 Stiffness of right shoulder, not elsewhere classified: Secondary | ICD-10-CM

## 2022-11-15 DIAGNOSIS — C50411 Malignant neoplasm of upper-outer quadrant of right female breast: Secondary | ICD-10-CM

## 2022-11-15 NOTE — Therapy (Addendum)
OUTPATIENT PHYSICAL THERAPY  UPPER EXTREMITY ONCOLOGY TREATMENT  Patient Name: Christie Fox MRN: [id number] DOB:1955/02/18, 67 y.o., female Today's Date: 11/15/2022  END OF SESSION:  PT End of Session - 11/15/22 0852     Visit Number 6    Number of Visits 12    Date for PT Re-Evaluation 12/04/22    PT Start Time 0855    PT Stop Time 1000    PT Time Calculation (min) 65 min    Activity Tolerance Patient tolerated treatment well    Behavior During Therapy [facility] for tasks assessed/performed             Past Medical History:  Diagnosis Date   Anxiety    Arthritis    knees   At risk for sleep apnea    STOP-BANG= 4    SENT TO PCP 07-31-2013   Borderline diabetes    diet controlled   Breast cancer (HCC)    Dysuria    History of kidney stones    Hyperlipidemia    Hypertension    Hypothyroidism    Migraines    PONV (postoperative nausea and vomiting)    "please call me Torri to wake me up , not Mrs Gaier"    Renal calculus, left    Urgency of urination    Past Surgical History:  Procedure Laterality Date   BREAST LUMPECTOMY WITH RADIOACTIVE SEED LOCALIZATION Right 02/27/2022   Procedure: RIGHT BREAST LUMPECTOMY WITH RADIOACTIVE SEED LOCALIZATION X3;  Surgeon: Emelia Loron, MD;  Location: [facility];  Service: General;  Laterality: Right;   CESAREAN SECTION  x2   CYSTOSCOPY WITH RETROGRADE PYELOGRAM, URETEROSCOPY AND STENT PLACEMENT Left 08/04/2013   Procedure: 1ST STAGE CYSTOSCOPY WITH RETROGRADE PYELOGRAM, URETEROSCOPY AND STENT PLACEMENT;  Surgeon: Sebastian Ache, MD;  Location: [facility];  Service: Urology;  Laterality: Left;   CYSTOSCOPY WITH RETROGRADE PYELOGRAM, URETEROSCOPY AND STENT PLACEMENT Left 08/20/2013   Procedure: 2ND STAGE CYSTOSCOPY WITH RETROGRADE PYELOGRAM, URETEROSCOPY BASKET STONES AND STENT EXCHANGE, BLOOD MOP;  Surgeon: Sebastian Ache, MD;  Location[facility];  Service: Urology;  Laterality: Left;   EXPLORATORY  LAPAROTOMY W/ BILATERAL SALPINOOPHORECTOMY  11-27-2001   HOLMIUM LASER APPLICATION Left 08/04/2013   Procedure: HOLMIUM LASER APPLICATION;  Surgeon: Sebastian Ache, MD;  Location: [facility];  Service: Urology;  Laterality: Left;   LAPAROSCOPIC GASTRIC SLEEVE RESECTION N/A 03/27/2017   Procedure: LAPAROSCOPIC GASTRIC SLEEVE RESECTION WITH UPPER ENDO;  Surgeon: Ovidio Kin, MD;  Location: [facility];  Service: General;  Laterality: N/A;   LEFT URETEROSCOPIC LASER LITHO STONE EXTRACTION AND STENT PLACEMENT  03-25-2005   LUMBAR FUSION  X2   LAST ONE 2000   ORIF LEFT ANKLE FX  03-20-2000   RETAINED HARDWARE   RADIOACTIVE SEED GUIDED EXCISIONAL BREAST BIOPSY Left 02/27/2022   Procedure: RADIOACTIVE SEED GUIDED EXCISIONAL LEFT BREAST BIOPSY;  Surgeon: Emelia Loron, MD;  Location: [facility];  Service: General;  Laterality: Left;   VAGINAL HYSTERECTOMY  1999   WISDOM TOOTH EXTRACTION     Patient Active Problem List   Diagnosis Date Noted   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) 12/30/2021   Hyperlipidemia 08/19/2014   Hypertension 08/19/2014   Hypothyroidism 08/19/2014   Primary osteoarthritis of both knees 08/19/2014    PCP: Brett Fairy, MD  REFERRING PROVIDER: Mancel Parsons, MD  REFERRING DIAG: Cervical and Right UE pain s/p Right breast Cancer with radiation  THERAPY DIAG:  Cervicalgia  Status post radiation  therapy  Malignant neoplasm of upper-outer quadrant of right breast in female, estrogen receptor positive (HCC)  Stiffness of right shoulder, not elsewhere classified  Localized edema  Abnormal posture  ONSET DATE: 05/2022  Rationale for Evaluation and Treatment: Rehabilitation  SUBJECTIVE:                                                                                                                                                                                           SUBJECTIVE STATEMENT:  I had a facial yesterday and it  was very relaxing. I was up at 2:00 this am because pain was running down my arm from the shoulder and I couldn't  get my hand to close. I used a heating pad this morning so I think I am a little more relaxed now. I felt good after I left last time.   EVAL Pts noticed pain in her shoulders approximately a month after radiation to the right axillary region post Right breast Cancer Surgery, and then started having throbbing pain in right UE, with tingling in right median nerve distribution. She feels a lot of tightness in the axillary region and notes swelling at the lateral trunk. She doesn't notice swelling in the right breast but the nipple gets chaffed. By 4:00 her bra has to come off. Pt has pain in bilateral UT and neck and difficulty turning. She feels weakness in her hand and has difficulty grasping things to write and carry.  PERTINENT HISTORY:  Pt with pain involving her neck and right shoulder, right arm, and right hand. Patient has a history significant for breast cancer with bilateral Lumpectomies; Right for DCIS, left with a Complex Sclerosing Lesion (Benign) and 0/1 incidental nodes. She had Adjuvant radiation on the right ending on 05/08/2022. Neck pain started 1-3 months after radiation. It is sharp, stabbing, aching and constant. Noted to have Diffuse DDD, Trace anterolisthesis at C3-C4 and C4-C5, and 2-3 mm anterolisthesis at C7-T1. Trace retrolisthesis at C6-C7,. Multilevel degenerative disc changes with disc space height loss most pronounced and moderate to severe at C5-C6 and C6-C7,Multilevel uncovertebral spurring and facet arthropathy throughout cervical spine.  PAIN:  Are you having pain? Yes NPRS scale: 6/10 due to anxiety Pain location:B shoulders neck, hand right shoulder joint Pain orientation: Right  PAIN TYPE: burning, sharp, throbbing, tight, and tingling Pain description:intermittent Aggravating factors: disturbed sleep, household chores,reaching with right UE,  Relieving  factors: muscle relaxers some help  PRECAUTIONS: Prior foot ,ankle (rods and pins) Left LE swelling,, back surgery, gastric sleeve, hysterectomy, pain in bilateral knees  RED FLAGS: Bowel or bladder incontinence: Yes:      WEIGHT  BEARING RESTRICTIONS: No  FALLS:  Has patient fallen in last 6 months? 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[place]  HAND DOMINANCE: right   PRIOR LEVEL OF FUNCTION: Independent  PATIENT GOALS: Be able to function better   OBJECTIVE:  COGNITION: Overall cognitive status: Within functional limits for tasks assessed   PALPATION: Very tender bilateral UT, cervical region, Bilateral pectorals and scapula greatest on the right and  right lateral trunk, medial arm, forearm  OBSERVATIONS / OTHER ASSESSMENTS: bilateral scapular protraction, swelling observed inferior to Right axilla at lateral trunk  SENSATION: Light touch:   POSTURE: forward head, rounded shoulders  UPPER EXTREMITY AROM/PROM:  A/PROM RIGHT   eval  RIGHT 11/07/2022  Shoulder extension 29, pain anterior shoulder 50  Shoulder flexion 127 pain under arm and upper arm 152  Shoulder abduction 108 posterior arm 155  Shoulder internal rotation 60   Shoulder external rotation 83 shoulder joint     (Blank rows = not tested)  A/PROM LEFT   eval  Shoulder extension 33  Shoulder flexion 147  Shoulder abduction 143  Shoulder internal rotation 64  Shoulder external rotation 90    (Blank rows = not tested)  CERVICAL AROM: slight right All within normal limits:    Percent limited  Flexion WNL pain  post neck and bilateral UT  Extension Dec 20%, pain in R shoulder blade  Right lateral flexion Dec 15%, pain right UT/shoulderblade  Left lateral flexion WNL,Tight Right UT, pain left UT  Right rotation Decreased 50%,, pain right neck, SB   Left rotation Decreased 20%  bilateral neck, SB    UPPER EXTREMITY STRENGTH:   LYMPHEDEMA ASSESSMENTS:   SURGERY TYPE/DATE: Bilateral breast lumpectomies;  NUMBER OF LYMPH NODES REMOVED: Left 0/1 incidental LN Right 0 LN's  CHEMOTHERAPY: No  RADIATION:Adjuvant ended 05/08/2022  HORMONE TREATMENT: recommended to take Anastrozole  INFECTIONS: NO   LYMPHEDEMA ASSESSMENTS:   LANDMARK RIGHT  eval  At axilla  35.1  15 cm proximal to olecranon process 33.7  10 cm proximal to olecranon process 32.6  Olecranon process 27.3  15 cm proximal to ulnar styloid process   10 cm proximal to ulnar styloid process 22.6  Just proximal to ulnar styloid process 16.8  Across hand at thumb web space 18.9  At base of 2nd digit 6.3  (Blank rows = not tested)  LANDMARK LEFT  eval  At axilla  34.8  15 cm proximal to olecranon process 33.0  10 cm proximal to olecranon process 29.8  Olecranon process 26.0  15 cm proximal to ulnar styloid process   10 cm proximal to ulnar styloid process 21.5  Just proximal to ulnar styloid process 16.4  Across hand at thumb web space 18.3  At base of 2nd digit 6.05  (Blank rows = not tested)   FUNCTIONAL TESTS: NT; has knee pain    QUICK DASH SURVEY: 70%   TODAY'S TREATMENT:  DATE:  11/15/2022 Pulleys x 2 min ea flexion and abd Bilateral cervical rotation and SB x 5 ea Wall arc x 4 to the left and  right manual traction with head in several different angles ; mild change in hand, no effect on arm pain STM to bilateral UT,  right pectorals, lateral trunk with cocoa butter PROM Right shoulder Flexion, scaption, abd, ER with multiple VC's to relax MLD performed by therapist; Right supraclavicular LN's, 5 breaths, Right inguinal LN's, right axillo-inguinal pathway repeated multiple times for right inferior axillary swelling and ending with right  inguinal  LN's. Had pt try same in sitting and using her left UE. She did well with practice;gave handout Discussed DN with pt; she would like to try  11/13/2022 STM to bilateral UT, pectorals, lateral trunk with cocoa butter Manual traction, suboccipital release PROM bilateral shoulders flex, abd, D2 flexion, IR and Erwith VC's to relax Supine AROM bilateral shoulder flexion, scaption, horizontal abd x 5  11/07/2022 Pulleys flexion and abduction x 2 min Wall arc bilaterally x 3 Stargazer and AAflexion x 5 STM to bilateral UT, Right pectorals, bilateral lateral trunk PROM right shoulder into flex, abd and D2 with scapular depression throughout   11/01/22: Therapeutic Exercises Ball roll up wall into flex x 10 returning therapist demo Pulleys: Flex x 2 mins and abd x 1 min Manual Therapy STM to bilateral UT, Right pectorals, then in Lt S/L to lateral trunk with cocoa butter, also to medial scap border where tightness and trigger points palpable Scap Mobs in Lt S/L into protraction and retraction, limited scap mobility initially but this improved some during session  Gentle manual cervical traction and suboccipital release PROM right shoulder into flex, abd and D2 with scapular depression throughout and with multiple VC's to relax, long arm distraction with oscillations for relaxation during stretches  10/26/2022  STM to bilateral UT, Right pectorals, lateral trunk with cocoa butter Gentle manual traction AAROM with hands x3, with wand x 4 flexion and scaption x 4 PROM right shoulder with multiple VC's to relax, long arm distraction Updated HEP with wand exs 10/23/2022 Discussed POC including number of visits,  treatment interventions. Educated in supine AA shoulder flexion and supine stargazer, and sitting or standing scapular retraction to start gently at home to help Right shoulder ROM. Emphasized that pain should not be in shoulder joint. Advised pt to try her compression bra for  lateral trunk swelling and to wear in next visit if possible so we can assess. Also discussed a good sports bra if her compression bra is too uncomfortable    PATIENT EDUCATION:  Education details: Discussed POC including number of visits,  treatment interventions. Educated in supine AA shoulder flexion and supine stargazer, and sitting or standing scapular retraction to start gently at home to help Right shoulder ROM. Emphasized that pain should not be in shoulder joint  Person educated: Patient and Spouse Education method: Explanation and Handouts Education comprehension: verbalized understanding and returned demonstration  HOME EXERCISE PROGRAM: Post op exercises; AA shoulder flexion, stargazer, sitting scapular retraction  ASSESSMENT:  CLINICAL IMPRESSION: Pts hand felt much better after therapy, however she continued with right arm pain. There was no change in arm pain with trial of manual traction with head in different angles of neck flexion. Very tender throughout Right Upper quarter. Pt does not relax well and requires moderate  VC's. Tp's noted in right suboccipitals.. May benefit from DN and she was going to try and set up a  few visits today.   OBJECTIVE IMPAIRMENTS: decreased activity tolerance, decreased knowledge of condition, decreased ROM, decreased strength, increased edema, increased fascial restrictions, impaired sensation, impaired UE functional use, postural dysfunction, and pain.   ACTIVITY LIMITATIONS: carrying, lifting, standing, squatting, sleeping, stairs, reach over head, and hygiene/grooming  PARTICIPATION LIMITATIONS: cleaning, laundry, driving, shopping, and usual household chores  PERSONAL FACTORS: 1-2 comorbidities: Right breast Cancer s/p radiation  are also affecting patient's functional outcome.   REHAB POTENTIAL: Good  CLINICAL DECISION MAKING: Stable/uncomplicated  EVALUATION COMPLEXITY: Low  GOALS: Goals reviewed with patient? Yes  SHORT TERM  GOALS: Target date: 11/13/2022  Pt will be independent in a HEP for ROM/strength of cervical and right UE regions Baseline: Goal status: INITIAL  2.  Pt will have decreased overall pain by 25% Baseline:  Goal status: INITIAL  3.  Pt will improve Right shoulder ROM by 20 degrees for shoulder abd, and 10 degrees for flexion for improved reaching Baseline:  Goal status: MET 11/07/2022 4.  Pt will have decreased c/o tingling in the right hand by 30% Baseline:  Goal status: INITIAL  5.  Quick dash will be improved by 20% to demonstrate improved function Baseline:  Goal status: INITIAL   LONG TERM GOALS: Target date: 12/04/2022  Pt will have decreased overall pain by 50% or greater Baseline:  Goal status: INITIAL  2.  Pts quick dash will improve to no greater than 20% to demonstrate improved function Baseline:  Goal status: INITIAL  3.  Pt will have right shoulder ROM Within 10 degrees of left for improved function Baseline:  Goal status: INITIAL  4.  Pts cervical ROM will be [facility] without increased pain Baseline:  Goal status: INITIAL  5.  Pt will be able to sleep with minimal disturbance from pain Baseline:  Goal status: INITIAL  6.  Pt will noted improved ability to grasp objects, and write with right hand Baseline:  Goal status: INITIAL  PLAN:  PT FREQUENCY: 2x/week  PT DURATION: 6 weeks  PLANNED INTERVENTIONS: Therapeutic exercises, Therapeutic activity, Neuromuscular re-education, Patient/Family education, Self Care, Orthotic/Fit training, Dry Needling, Moist heat, Manual lymph drainage, Manual therapy, Re-evaluation, and    PLAN FOR NEXT SESSION:  check goals,Review MLD for right inferior axillary swelling,Postural education, STM bilateral UT, Right pectorals, lateral trunk, check compression bra if she wears,  manual traction/?cervical traction,, progress HEP as able and progress to strength when feeling better, median nerve glides,  Waynette Buttery,  PT 11/15/2022, 10:26 AM

## 2022-11-20 ENCOUNTER — Ambulatory Visit: Payer: Medicare Other

## 2022-11-20 DIAGNOSIS — Z923 Personal history of irradiation: Secondary | ICD-10-CM

## 2022-11-20 DIAGNOSIS — M542 Cervicalgia: Secondary | ICD-10-CM

## 2022-11-20 DIAGNOSIS — M25611 Stiffness of right shoulder, not elsewhere classified: Secondary | ICD-10-CM

## 2022-11-20 DIAGNOSIS — Z170 Estrogen receptor positive status [ER+]: Secondary | ICD-10-CM

## 2022-11-20 DIAGNOSIS — R600 Localized edema: Secondary | ICD-10-CM

## 2022-11-20 DIAGNOSIS — R293 Abnormal posture: Secondary | ICD-10-CM

## 2022-11-20 NOTE — Therapy (Signed)
OUTPATIENT PHYSICAL THERAPY  UPPER EXTREMITY ONCOLOGY TREATMENT  Patient Name: Christie Fox MRN: [id number] DOB:[birth year], 67 y.o., female Today's Date: 11/20/2022  END OF SESSION:  PT End of Session - 11/20/22 1059     Visit Number 7    Number of Visits 12    Date for PT Re-Evaluation 12/04/22    Authorization Type humana    Authorization Time Period 10/23/22-12/04/2022    Authorization - Visit Number 7    Authorization - Number of Visits 12    PT Start Time 1102    PT Stop Time 1156    PT Time Calculation (min) 54 min    Activity Tolerance Patient tolerated treatment well    Behavior During Therapy WFL for tasks assessed/performed             Past Medical History:  Diagnosis Date   Anxiety    Arthritis    knees   At risk for sleep apnea    STOP-BANG= 4    SENT TO PCP 07-31-2013   Borderline diabetes    diet controlled   Breast cancer (HCC)    Dysuria    History of kidney stones    Hyperlipidemia    Hypertension    Hypothyroidism    Migraines    PONV (postoperative nausea and vomiting)    "please call me Kanye to wake me up , not Mrs Patman"    Renal calculus, left    Urgency of urination    Past Surgical History:  Procedure Laterality Date   BREAST LUMPECTOMY WITH RADIOACTIVE SEED LOCALIZATION Right 02/27/2022   Procedure: RIGHT BREAST LUMPECTOMY WITH RADIOACTIVE SEED LOCALIZATION X3;  Surgeon: Emelia Loron, MD;  Location: [facility];  Service: General;  Laterality: Right;   CESAREAN SECTION  x2   CYSTOSCOPY WITH RETROGRADE PYELOGRAM, URETEROSCOPY AND STENT PLACEMENT Left 08/04/2013   Procedure: 1ST STAGE CYSTOSCOPY WITH RETROGRADE PYELOGRAM, URETEROSCOPY AND STENT PLACEMENT;  Surgeon: Sebastian Ache, MD;  Location: [facility];  Service: Urology;  Laterality: Left;   CYSTOSCOPY WITH RETROGRADE PYELOGRAM, URETEROSCOPY AND STENT PLACEMENT Left 08/20/2013   Procedure: 2ND STAGE CYSTOSCOPY WITH RETROGRADE PYELOGRAM, URETEROSCOPY BASKET STONES  AND STENT EXCHANGE, BLOOD MOP;  Surgeon: Sebastian Ache, MD;  Location: [facility];  Service: Urology;  Laterality: Left;   EXPLORATORY LAPAROTOMY W/ BILATERAL SALPINOOPHORECTOMY  11-27-2001   HOLMIUM LASER APPLICATION Left 08/04/2013   Procedure: HOLMIUM LASER APPLICATION;  Surgeon: Sebastian Ache, MD;  Location: [facility];  Service: Urology;  Laterality: Left;   LAPAROSCOPIC GASTRIC SLEEVE RESECTION N/A 03/27/2017   Procedure: LAPAROSCOPIC GASTRIC SLEEVE RESECTION WITH UPPER ENDO;  Surgeon: Ovidio Kin, MD;  Location: [facility];  Service: General;  Laterality: N/A;   LEFT URETEROSCOPIC LASER LITHO STONE EXTRACTION AND STENT PLACEMENT  03-25-2005   LUMBAR FUSION  X2   LAST ONE 2000   ORIF LEFT ANKLE FX  03-20-2000   RETAINED HARDWARE   RADIOACTIVE SEED GUIDED EXCISIONAL BREAST BIOPSY Left 02/27/2022   Procedure: RADIOACTIVE SEED GUIDED EXCISIONAL LEFT BREAST BIOPSY;  Surgeon: Emelia Loron, MD;  Location: [facility];  Service: General;  Laterality: Left;   VAGINAL HYSTERECTOMY  1999   WISDOM TOOTH EXTRACTION     Patient Active Problem List   Diagnosis Date Noted   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) 12/30/2021   Hyperlipidemia 08/19/2014   Hypertension 08/19/2014   Hypothyroidism 08/19/2014   Primary osteoarthritis of both knees 08/19/2014    PCP: Brett Fairy,  MD  REFERRING PROVIDER: Mancel Parsons, MD  REFERRING DIAG: Cervical and Right UE pain s/p Right breast Cancer with radiation  THERAPY DIAG:  Cervicalgia  Status post radiation therapy  Malignant neoplasm of upper-outer quadrant of right breast in female, estrogen receptor positive (HCC)  Stiffness of right shoulder, not elsewhere classified  Localized edema  Abnormal posture  ONSET DATE: 05/2022  Rationale for Evaluation and Treatment: Rehabilitation  SUBJECTIVE:                                                                                                                                                                                            SUBJECTIVE STATEMENT:   When I got up pain was really high, 11/10 in UT down the arm into the hand and fingers. It started about 4:00 this am. Its about a 9 now. I wore my compression bra this weekend and I did the MLD on both sides and I felt good at the lateral trunks afterward but my groins were a little painful for some reason,  EVAL Pts noticed pain in her shoulders approximately a month after radiation to the right axillary region post Right breast Cancer Surgery, and then started having throbbing pain in right UE, with tingling in right median nerve distribution. She feels a lot of tightness in the axillary region and notes swelling at the lateral trunk. She doesn't notice swelling in the right breast but the nipple gets chaffed. By 4:00 her bra has to come off. Pt has pain in bilateral UT and neck and difficulty turning. She feels weakness in her hand and has difficulty grasping things to write and carry.  PERTINENT HISTORY:  Pt with pain involving her neck and right shoulder, right arm, and right hand. Patient has a history significant for breast cancer with bilateral Lumpectomies; Right for DCIS, left with a Complex Sclerosing Lesion (Benign) and 0/1 incidental nodes. She had Adjuvant radiation on the right ending on 05/08/2022. Neck pain started 1-3 months after radiation. It is sharp, stabbing, aching and constant. Noted to have Diffuse DDD, Trace anterolisthesis at C3-C4 and C4-C5, and 2-3 mm anterolisthesis at C7-T1. Trace retrolisthesis at C6-C7,. Multilevel degenerative disc changes with disc space height loss most pronounced and moderate to severe at C5-C6 and C6-C7,Multilevel uncovertebral spurring and facet arthropathy throughout cervical spine.  PAIN:  Are you having pain? Yes NPRS scale: 9/10  Pain location:B shoulders neck, hand right shoulder joint, down arm Pain orientation: Right  PAIN  TYPE: burning, sharp, throbbing, tight, and tingling Pain description:intermittent Aggravating factors: disturbed sleep, household chores,reaching with right UE,  Relieving factors: muscle relaxers some help  PRECAUTIONS: Prior foot ,ankle (rods and pins) Left LE swelling,, back surgery, gastric sleeve, hysterectomy, pain in bilateral knees  RED FLAGS: Bowel or bladder incontinence: Yes:      WEIGHT BEARING RESTRICTIONS: No  FALLS:  Has patient fallen in last 6 months? 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[place]  HAND DOMINANCE: right   PRIOR LEVEL OF FUNCTION: Independent  PATIENT GOALS: Be able to function better   OBJECTIVE:  COGNITION: Overall cognitive status: Within functional limits for tasks assessed   PALPATION: Very tender bilateral UT, cervical region, Bilateral pectorals and scapula greatest on the right and  right lateral trunk, medial arm, forearm  OBSERVATIONS / OTHER ASSESSMENTS: bilateral scapular protraction, swelling observed inferior to Right axilla at lateral trunk  SENSATION: Light touch:   POSTURE: forward head, rounded shoulders  UPPER EXTREMITY AROM/PROM:  A/PROM RIGHT   eval  RIGHT 11/07/2022  Shoulder extension 29, pain anterior shoulder 50  Shoulder flexion 127 pain under arm and upper arm 152  Shoulder abduction 108 posterior arm 155  Shoulder internal rotation 60   Shoulder external rotation 83 shoulder joint     (Blank rows = not tested)  A/PROM LEFT   eval  Shoulder extension 33  Shoulder flexion 147  Shoulder abduction 143  Shoulder internal rotation 64  Shoulder external rotation 90    (Blank rows = not tested)  CERVICAL AROM: slight right All within normal limits:    Percent limited  Flexion WNL pain  post neck and bilateral UT  Extension Dec 20%, pain in R shoulder blade  Right lateral flexion Dec 15%,  pain right UT/shoulderblade  Left lateral flexion WNL,Tight Right UT, pain left UT  Right rotation Decreased 50%,, pain right neck, SB   Left rotation Decreased 20% bilateral neck, SB    UPPER EXTREMITY STRENGTH:   LYMPHEDEMA ASSESSMENTS:   SURGERY TYPE/DATE: Bilateral breast lumpectomies;  NUMBER OF LYMPH NODES REMOVED: Left 0/1 incidental LN Right 0 LN's  CHEMOTHERAPY: No  RADIATION:Adjuvant ended 05/08/2022  HORMONE TREATMENT: recommended to take Anastrozole  INFECTIONS: NO   LYMPHEDEMA ASSESSMENTS:   LANDMARK RIGHT  eval  At axilla  35.1  15 cm proximal to olecranon process 33.7  10 cm proximal to olecranon process 32.6  Olecranon process 27.3  15 cm proximal to ulnar styloid process   10 cm proximal to ulnar styloid process 22.6  Just proximal to ulnar styloid process 16.8  Across hand at thumb web space 18.9  At base of 2nd digit 6.3  (Blank rows = not tested)  LANDMARK LEFT  eval  At axilla  34.8  15 cm proximal to olecranon process 33.0  10 cm proximal to olecranon process 29.8  Olecranon process 26.0  15 cm proximal to ulnar styloid process   10 cm proximal to ulnar styloid process 21.5  Just proximal to ulnar styloid process 16.4  Across hand at thumb web space 18.3  At base of 2nd digit 6.05  (Blank rows = not tested)   FUNCTIONAL TESTS: NT; has knee pain    QUICK DASH SURVEY: 70%   TODAY'S TREATMENT:  DATE: 11/20/2022 Scapular retraction and shoulder ext with yellow band x 5 ea with emphasis on posture Seated cervical retraction x 7 increased no worse Supine cervical retractions 2 x 5 produced shoulder blade no worse, no change in UE Shoulder presses x 5 Supine manual traction and suboccipital release; decrease UD pain with manual traction STM to bilateral UT,  right pectorals, lateral trunk with cocoa  butter PROM Right shoulder Flexion, scaption, abd, ER with multiple VC's to relax Supine bilateral AROM :shoulder flexion, scaption, and horizontal abduction MLD performed by therapist; Right supraclavicular LN's, 5 breaths, Right inguinal LN's, right axillo-inguinal pathway repeated multiple times for right inferior axillary swelling and ending with right inguinal  LN's.  11/15/2022 Pulleys x 2 min ea flexion and abd Bilateral cervical rotation and SB x 5 ea Wall arc x 4 to the left and  right manual traction with head in several different angles ; mild change in hand, no effect on arm pain STM to bilateral UT,  right pectorals, lateral trunk with cocoa butter PROM Right shoulder Flexion, scaption, abd, ER with multiple VC's to relax MLD performed by therapist; Right supraclavicular LN's, 5 breaths, Right inguinal LN's, right axillo-inguinal pathway repeated multiple times for right inferior axillary swelling and ending with right inguinal  LN's. Had pt try same in sitting and using her left UE. She did well with practice;gave handout Discussed DN with pt; she would like to try  11/13/2022 STM to bilateral UT, pectorals, lateral trunk with cocoa butter Manual traction, suboccipital release PROM bilateral shoulders flex, abd, D2 flexion, IR and Erwith VC's to relax Supine AROM bilateral shoulder flexion, scaption, horizontal abd x 5  11/07/2022 Pulleys flexion and abduction x 2 min Wall arc bilaterally x 3 Stargazer and AAflexion x 5 STM to bilateral UT, Right pectorals, bilateral lateral trunk PROM right shoulder into flex, abd and D2 with scapular depression throughout   11/01/22: Therapeutic Exercises Ball roll up wall into flex x 10 returning therapist demo Pulleys: Flex x 2 mins and abd x 1 min Manual Therapy STM to bilateral UT, Right pectorals, then in Lt S/L to lateral trunk with cocoa butter, also to medial scap border where tightness and trigger points palpable Scap Mobs in  Lt S/L into protraction and retraction, limited scap mobility initially but this improved some during session  Gentle manual cervical traction and suboccipital release PROM right shoulder into flex, abd and D2 with scapular depression throughout and with multiple VC's to relax, long arm distraction with oscillations for relaxation during stretches  10/26/2022  STM to bilateral UT, Right pectorals, lateral trunk with cocoa butter Gentle manual traction AAROM with hands x3, with wand x 4 flexion and scaption x 4 PROM right shoulder with multiple VC's to relax, long arm distraction Updated HEP with wand exs 10/23/2022 Discussed POC including number of visits,  treatment interventions. Educated in supine AA shoulder flexion and supine stargazer, and sitting or standing scapular retraction to start gently at home to help Right shoulder ROM. Emphasized that pain should not be in shoulder joint. Advised pt to try her compression bra for lateral trunk swelling and to wear in next visit if possible so we can assess. Also discussed a good sports bra if her compression bra is too uncomfortable    PATIENT EDUCATION:  Education details: Discussed POC including number of visits,  treatment interventions. Educated in supine AA shoulder flexion and supine stargazer, and sitting or standing scapular retraction to start gently at home to help  Right shoulder ROM. Emphasized that pain should not be in shoulder joint  Person educated: Patient and Spouse Education method: Explanation and Handouts Education comprehension: verbalized understanding and returned demonstration  HOME EXERCISE PROGRAM: Post op exercises; AA shoulder flexion, stargazer, sitting scapular retraction  ASSESSMENT:  CLINICAL IMPRESSION: Pt continues with significant right UE pain. Right arm pain decreased to 6/10 after manual therapy today.. Had a good response to manual traction today with decreased UE pain. May benefit from [facility]  trac.  OBJECTIVE IMPAIRMENTS: decreased activity tolerance, decreased knowledge of condition, decreased ROM, decreased strength, increased edema, increased fascial restrictions, impaired sensation, impaired UE functional use, postural dysfunction, and pain.   ACTIVITY LIMITATIONS: carrying, lifting, standing, squatting, sleeping, stairs, reach over head, and hygiene/grooming  PARTICIPATION LIMITATIONS: cleaning, laundry, driving, shopping, and usual household chores  PERSONAL FACTORS: 1-2 comorbidities: Right breast Cancer s/p radiation  are also affecting patient's functional outcome.   REHAB POTENTIAL: Good  CLINICAL DECISION MAKING: Stable/uncomplicated  EVALUATION COMPLEXITY: Low  GOALS: Goals reviewed with patient? Yes  SHORT TERM GOALS: Target date: 11/13/2022  Pt will be independent in a HEP for ROM/strength of cervical and right UE regions Baseline: Goal status: MET, UE 2.  Pt will have decreased overall pain by 25% Baseline:  Goal status: INITIAL  3.  Pt will improve Right shoulder ROM by 20 degrees for shoulder abd, and 10 degrees for flexion for improved reaching Baseline:  Goal status: MET 11/07/2022 4.  Pt will have decreased c/o tingling in the right hand by 30% Baseline:  Goal status: INITIAL  5.  Quick dash will be improved by 20% to demonstrate improved function Baseline:  Goal status: INITIAL   LONG TERM GOALS: Target date: 12/04/2022  Pt will have decreased overall pain by 50% or greater Baseline:  Goal status: INITIAL  2.  Pts quick dash will improve to no greater than 20% to demonstrate improved function Baseline:  Goal status: INITIAL  3.  Pt will have right shoulder ROM Within 10 degrees of left for improved function Baseline:  Goal status: INITIAL  4.  Pts cervical ROM will be [facility] without increased pain Baseline:  Goal status: INITIAL  5.  Pt will be able to sleep with minimal disturbance from pain Baseline:  Goal status: INITIAL  6.   Pt will noted improved ability to grasp objects, and write with right hand Baseline:  Goal status: INITIAL  PLAN:  PT FREQUENCY: 2x/week  PT DURATION: 6 weeks  PLANNED INTERVENTIONS: Therapeutic exercises, Therapeutic activity, Neuromuscular re-education, Patient/Family education, Self Care, Orthotic/Fit training, Dry Needling, Moist heat, Manual lymph drainage, Manual therapy, Re-evaluation, and    PLAN FOR NEXT SESSION:  DN,consider [employer], needs review of TB exs before given for HEP,Review MLD for right inferior axillary swelling,Postural education, STM bilateral UT, Right pectorals, lateral trunk, check compression bra if she wears,  manual traction/?cervical traction,, progress HEP as able and progress to strength when feeling better, median nerve glides,  Waynette Buttery, PT 11/20/2022, 11:58 AM

## 2022-11-22 ENCOUNTER — Ambulatory Visit: Payer: Medicare Other | Admitting: Physical Therapy

## 2022-11-22 DIAGNOSIS — Z923 Personal history of irradiation: Secondary | ICD-10-CM

## 2022-11-22 DIAGNOSIS — M542 Cervicalgia: Secondary | ICD-10-CM | POA: Diagnosis not present

## 2022-11-22 DIAGNOSIS — Z170 Estrogen receptor positive status [ER+]: Secondary | ICD-10-CM

## 2022-11-22 NOTE — Therapy (Signed)
OUTPATIENT PHYSICAL THERAPY  UPPER EXTREMITY ONCOLOGY TREATMENT  Patient Name: Christie Fox MRN: [id number] DOB:03-06-1955, 67 y.o., female Today's Date: 11/22/2022  END OF SESSION:  PT End of Session - 11/22/22 0754     Visit Number 8    Number of Visits 12    Date for PT Re-Evaluation 12/04/22    Authorization Type humana    Authorization Time Period 10/23/22-12/04/2022    Authorization - Visit Number 8    Authorization - Number of Visits 12    PT Start Time 0800    PT Stop Time 0838    PT Time Calculation (min) 38 min    Activity Tolerance Patient tolerated treatment well             Past Medical History:  Diagnosis Date   Anxiety    Arthritis    knees   At risk for sleep apnea    STOP-BANG= 4    SENT TO PCP 07-31-2013   Borderline diabetes    diet controlled   Breast cancer (HCC)    Dysuria    History of kidney stones    Hyperlipidemia    Hypertension    Hypothyroidism    Migraines    PONV (postoperative nausea and vomiting)    "please call me [name] to wake me up , not [name]"    Renal calculus, left    Urgency of urination    Past Surgical History:  Procedure Laterality Date   BREAST LUMPECTOMY WITH RADIOACTIVE SEED LOCALIZATION Right 02/27/2022   Procedure: RIGHT BREAST LUMPECTOMY WITH RADIOACTIVE SEED LOCALIZATION X3;  Surgeon: Emelia Loron, MD;  Location: [facility];  Service: General;  Laterality: Right;   CESAREAN SECTION  x2   CYSTOSCOPY WITH RETROGRADE PYELOGRAM, URETEROSCOPY AND STENT PLACEMENT Left 08/04/2013   Procedure: 1ST STAGE CYSTOSCOPY WITH RETROGRADE PYELOGRAM, URETEROSCOPY AND STENT PLACEMENT;  Surgeon: Sebastian Ache, MD;  Location: [facility];  Service: Urology;  Laterality: Left;   CYSTOSCOPY WITH RETROGRADE PYELOGRAM, URETEROSCOPY AND STENT PLACEMENT Left 08/20/2013   Procedure: 2ND STAGE CYSTOSCOPY WITH RETROGRADE PYELOGRAM, URETEROSCOPY BASKET STONES AND STENT EXCHANGE, BLOOD MOP;  Surgeon: Sebastian Ache, MD;   Location: [facility];  Service: Urology;  Laterality: Left;   EXPLORATORY LAPAROTOMY W/ BILATERAL SALPINOOPHORECTOMY  11-27-2001   HOLMIUM LASER APPLICATION Left 08/04/2013   Procedure: HOLMIUM LASER APPLICATION;  Surgeon: Sebastian Ache, MD;  Location: [facility];  Service: Urology;  Laterality: Left;   LAPAROSCOPIC GASTRIC SLEEVE RESECTION N/A 03/27/2017   Procedure: LAPAROSCOPIC GASTRIC SLEEVE RESECTION WITH UPPER ENDO;  Surgeon: Ovidio Kin, MD;  Location: [facility];  Service: General;  Laterality: N/A;   LEFT URETEROSCOPIC LASER LITHO STONE EXTRACTION AND STENT PLACEMENT  03-25-2005   LUMBAR FUSION  X2   LAST ONE 2000   ORIF LEFT ANKLE FX  03-20-2000   RETAINED HARDWARE   RADIOACTIVE SEED GUIDED EXCISIONAL BREAST BIOPSY Left 02/27/2022   Procedure: RADIOACTIVE SEED GUIDED EXCISIONAL LEFT BREAST BIOPSY;  Surgeon: Emelia Loron, MD;  Location:  [facility];  Service: General;  Laterality: Left;   VAGINAL HYSTERECTOMY  1999   WISDOM TOOTH EXTRACTION     Patient Active Problem List   Diagnosis Date Noted   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) 12/30/2021   Hyperlipidemia 08/19/2014   Hypertension 08/19/2014   Hypothyroidism 08/19/2014   Primary osteoarthritis of both knees 08/19/2014    PCP: Brett Fairy, MD  REFERRING PROVIDER: Mancel Parsons, MD  REFERRING DIAG:  Cervical and Right UE pain s/p Right breast Cancer with radiation  THERAPY DIAG:  Cervicalgia  Status post radiation therapy  Malignant neoplasm of upper-outer quadrant of right breast in female, estrogen receptor positive (HCC)  ONSET DATE: 05/2022  Rationale for Evaluation and Treatment: Rehabilitation  SUBJECTIVE:                                                                                                                                                                                           SUBJECTIVE STATEMENT:  Soreness especially with turning to  the right.   Right fingers numb since radiation.  Right upper trap is the worst spot.  Right armpit area also painful.  Right side of neck also.   EVAL Pts noticed pain in her shoulders approximately a month after radiation to the right axillary region post Right breast Cancer Surgery, and then started having throbbing pain in right UE, with tingling in right median nerve distribution. She feels a lot of tightness in the axillary region and notes swelling at the lateral trunk. She doesn't notice swelling in the right breast but the nipple gets chaffed. By 4:00 her bra has to come off. Pt has pain in bilateral UT and neck and difficulty turning. She feels weakness in her hand and has difficulty grasping things to write and carry.  PERTINENT HISTORY:  Pt with pain involving her neck and right shoulder, right arm, and right hand. Patient has a history significant for breast cancer with bilateral Lumpectomies; Right for DCIS, left with a Complex Sclerosing Lesion (Benign) and 0/1 incidental nodes. She had Adjuvant radiation on the right ending on 05/08/2022. Neck pain started 1-3 months after radiation. It is sharp, stabbing, aching and constant. Noted to have Diffuse DDD, Trace anterolisthesis at C3-C4 and C4-C5, and 2-3 mm anterolisthesis at C7-T1. Trace retrolisthesis at C6-C7,. Multilevel degenerative disc changes with disc space height loss most pronounced and moderate to severe at C5-C6 and C6-C7,Multilevel uncovertebral spurring and facet arthropathy throughout cervical spine.  PAIN:  Are you having pain? Yes NPRS scale: 8/10  Pain location:right shoulders neck right lower scapula, right neck Pain orientation: Right  PAIN TYPE: burning, sharp, throbbing, tight, and tingling Pain description:intermittent Aggravating factors: disturbed sleep, household chores,reaching with right UE,  Relieving factors: muscle relaxers some help  PRECAUTIONS: Prior foot ,ankle (rods and pins) Left LE swelling,, back  surgery, gastric sleeve, hysterectomy, pain in bilateral knees  RED FLAGS: Bowel or bladder incontinence: Yes:      WEIGHT BEARING RESTRICTIONS: No  FALLS:  Has patient fallen in last 6 months? No  LIVING ENVIRONMENT: Lives with: lives with their  spouse Lives in: House/apartment   OCCUPATION: Retired  LEISURE: Chief Financial Officer [place]  HAND DOMINANCE: right   PRIOR LEVEL OF FUNCTION: Independent  PATIENT GOALS: Be able to function better   OBJECTIVE:  COGNITION: Overall cognitive status: Within functional limits for tasks assessed   PALPATION: Very tender bilateral UT, cervical region, Bilateral pectorals and scapula greatest on the right and  right lateral trunk, medial arm, forearm  OBSERVATIONS / OTHER ASSESSMENTS: bilateral scapular protraction, swelling observed inferior to Right axilla at lateral trunk  SENSATION: Light touch:   POSTURE: forward head, rounded shoulders  UPPER EXTREMITY AROM/PROM:  A/PROM RIGHT   eval  RIGHT 11/07/2022  Shoulder extension 29, pain anterior shoulder 50  Shoulder flexion 127 pain under arm and upper arm 152  Shoulder abduction 108 posterior arm 155  Shoulder internal rotation 60   Shoulder external rotation 83 shoulder joint     (Blank rows = not tested)  A/PROM LEFT   eval  Shoulder extension 33  Shoulder flexion 147  Shoulder abduction 143  Shoulder internal rotation 64  Shoulder external rotation 90    (Blank rows = not tested)  CERVICAL AROM: slight right All within normal limits:    Percent limited  Flexion WNL pain  post neck and bilateral UT  Extension Dec 20%, pain in R shoulder blade  Right lateral flexion Dec 15%, pain right UT/shoulderblade  Left lateral flexion WNL,Tight Right UT, pain left UT  Right rotation Decreased 50%,, pain right neck, SB   Left rotation Decreased 20% bilateral neck, SB    UPPER EXTREMITY STRENGTH:   LYMPHEDEMA ASSESSMENTS:   SURGERY TYPE/DATE: Bilateral  breast lumpectomies;  NUMBER OF LYMPH NODES REMOVED: Left 0/1 incidental LN Right 0 LN's  CHEMOTHERAPY: No  RADIATION:Adjuvant ended 05/08/2022  HORMONE TREATMENT: recommended to take Anastrozole  INFECTIONS: NO   LYMPHEDEMA ASSESSMENTS:   LANDMARK RIGHT  eval  At axilla  35.1  15 cm proximal to olecranon process 33.7  10 cm proximal to olecranon process 32.6  Olecranon process 27.3  15 cm proximal to ulnar styloid process   10 cm proximal to ulnar styloid process 22.6  Just proximal to ulnar styloid process 16.8  Across hand at thumb web space 18.9  At base of 2nd digit 6.3  (Blank rows = not tested)  LANDMARK LEFT  eval  At axilla  34.8  15 cm proximal to olecranon process 33.0  10 cm proximal to olecranon process 29.8  Olecranon process 26.0  15 cm proximal to ulnar styloid process   10 cm proximal to ulnar styloid process 21.5  Just proximal to ulnar styloid process 16.4  Across hand at thumb web space 18.3  At base of 2nd digit 6.05  (Blank rows = not tested)   FUNCTIONAL TESTS: NT; has knee pain    QUICK DASH SURVEY: 70%   TODAY'S TREATMENT:        DATE: 11/22/2022   Pre-DN cervical rotation: right 12, left 60 degrees Post DN right rotation 28 degrees, left 62 Review of HEP: cervical retractions (to lengthen suboccipitals), cervical rotation combined with a little flexion to lengthen levator scap  Manual therapy: right only upper traps, levator scap, periscapular muscles, cervical paraspinals Trigger Point Dry-Needling  Treatment instructions: Expect mild to moderate muscle soreness. S/S of pneumothorax if dry needled over a lung field, and to seek immediate medical attention should they occur. Patient verbalized understanding of these instructions and education.  Patient Consent Given: Yes Education handout  provided: Yes Muscles treated:  right only upper traps, levator scap, teres,  cervical multifidi, suboccipitals Electrical stimulation  performed: No Parameters: N/A Treatment response/outcome: decreased tender point size and number, improved soft tissue mobility                                                                               DATE: 11/20/2022 Scapular retraction and shoulder ext with yellow band x 5 ea with emphasis on posture Seated cervical retraction x 7 increased no worse Supine cervical retractions 2 x 5 produced shoulder blade no worse, no change in UE Shoulder presses x 5 Supine manual traction and suboccipital release; decrease UD pain with manual traction STM to bilateral UT,  right pectorals, lateral trunk with cocoa butter PROM Right shoulder Flexion, scaption, abd, ER with multiple VC's to relax Supine bilateral AROM :shoulder flexion, scaption, and horizontal abduction MLD performed by therapist; Right supraclavicular LN's, 5 breaths, Right inguinal LN's, right axillo-inguinal pathway repeated multiple times for right inferior axillary swelling and ending with right inguinal  LN's.  11/15/2022 Pulleys x 2 min ea flexion and abd Bilateral cervical rotation and SB x 5 ea Wall arc x 4 to the left and  right manual traction with head in several different angles ; mild change in hand, no effect on arm pain STM to bilateral UT,  right pectorals, lateral trunk with cocoa butter PROM Right shoulder Flexion, scaption, abd, ER with multiple VC's to relax MLD performed by therapist; Right supraclavicular LN's, 5 breaths, Right inguinal LN's, right axillo-inguinal pathway repeated multiple times for right inferior axillary swelling and ending with right inguinal  LN's. Had pt try same in sitting and using her left UE. She did well with practice;gave handout Discussed DN with pt; she would like to try  11/13/2022 STM to bilateral UT, pectorals, lateral trunk with cocoa butter Manual traction, suboccipital release PROM bilateral shoulders flex, abd, D2 flexion, IR and Erwith VC's to relax Supine AROM  bilateral shoulder flexion, scaption, horizontal abd x 5  11/07/2022 Pulleys flexion and abduction x 2 min Wall arc bilaterally x 3 Stargazer and AAflexion x 5 STM to bilateral UT, Right pectorals, bilateral lateral trunk PROM right shoulder into flex, abd and D2 with scapular depression throughout   11/01/22: Therapeutic Exercises Ball roll up wall into flex x 10 returning therapist demo Pulleys: Flex x 2 mins and abd x 1 min Manual Therapy STM to bilateral UT, Right pectorals, then in Lt S/L to lateral trunk with cocoa butter, also to medial scap border where tightness and trigger points palpable Scap Mobs in Lt S/L into protraction and retraction, limited scap mobility initially but this improved some during session  Gentle manual cervical traction and suboccipital release PROM right shoulder into flex, abd and D2 with scapular depression throughout and with multiple VC's to relax, long arm distraction with oscillations for relaxation during stretches  10/26/2022  STM to bilateral UT, Right pectorals, lateral trunk with cocoa butter Gentle manual traction AAROM with hands x3, with wand x 4 flexion and scaption x 4 PROM right shoulder with multiple VC's to relax, long arm distraction Updated HEP with wand exs 10/23/2022 Discussed POC including number of visits,  treatment interventions. Educated in supine AA shoulder flexion and supine stargazer, and sitting or standing scapular retraction to start gently at home to help Right shoulder ROM. Emphasized that pain should not be in shoulder joint. Advised pt to try her compression bra for lateral trunk swelling and to wear in next visit if possible so we can assess. Also discussed a good sports bra if her compression bra is too uncomfortable    PATIENT EDUCATION:  Education details: Discussed POC including number of visits,  treatment interventions. Educated in supine AA shoulder flexion and supine stargazer, and sitting or standing  scapular retraction to start gently at home to help Right shoulder ROM. Emphasized that pain should not be in shoulder joint Dry needling handout Person educated: Patient and Spouse Education method: Explanation and Handouts Education comprehension: verbalized understanding and returned demonstration  HOME EXERCISE PROGRAM: Post op exercises; AA shoulder flexion, stargazer, sitting scapular retraction  ASSESSMENT:  CLINICAL IMPRESSION: Pt states she has not tried lying prone since her diagnosis but overall tolerated the position well.  The patient had a positive initial response to DN with much improved soft tissue mobility and decreased size and number of tender points.  Therapist monitoring response and educating patient on what to expect following DN and how to optimize benefit with specific exercise.  Anticipate pain intensity and mobility will continue to improve over the next few days.  Her rotation ROM improved significantly pre and post session but pain level remains 8/10 pain post session.    OBJECTIVE IMPAIRMENTS: decreased activity tolerance, decreased knowledge of condition, decreased ROM, decreased strength, increased edema, increased fascial restrictions, impaired sensation, impaired UE functional use, postural dysfunction, and pain.   ACTIVITY LIMITATIONS: carrying, lifting, standing, squatting, sleeping, stairs, reach over head, and hygiene/grooming  PARTICIPATION LIMITATIONS: cleaning, laundry, driving, shopping, and usual household chores  PERSONAL FACTORS: 1-2 comorbidities: Right breast Cancer s/p radiation  are also affecting patient's functional outcome.   REHAB POTENTIAL: Good  CLINICAL DECISION MAKING: Stable/uncomplicated  EVALUATION COMPLEXITY: Low  GOALS: Goals reviewed with patient? Yes  SHORT TERM GOALS: Target date: 11/13/2022  Pt will be independent in a HEP for ROM/strength of cervical and right UE regions Baseline: Goal status: MET, UE 2.  Pt will  have decreased overall pain by 25% Baseline:  Goal status: INITIAL  3.  Pt will improve Right shoulder ROM by 20 degrees for shoulder abd, and 10 degrees for flexion for improved reaching Baseline:  Goal status: MET 11/07/2022 4.  Pt will have decreased c/o tingling in the right hand by 30% Baseline:  Goal status: INITIAL  5.  Quick dash will be improved by 20% to demonstrate improved function Baseline:  Goal status: INITIAL   LONG TERM GOALS: Target date: 12/04/2022  Pt will have decreased overall pain by 50% or greater Baseline:  Goal status: INITIAL  2.  Pts quick dash will improve to no greater than 20% to demonstrate improved function Baseline:  Goal status: INITIAL  3.  Pt will have right shoulder ROM Within 10 degrees of left for improved function Baseline:  Goal status: INITIAL  4.  Pts cervical ROM will be [facility] without increased pain Baseline:  Goal status: INITIAL  5.  Pt will be able to sleep with minimal disturbance from pain Baseline:  Goal status: INITIAL  6.  Pt will noted improved ability to grasp objects, and write with right hand Baseline:  Goal status: INITIAL  PLAN:  PT FREQUENCY: 2x/week  PT DURATION: 6 weeks  PLANNED INTERVENTIONS: Therapeutic exercises, Therapeutic activity, Neuromuscular re-education, Patient/Family education, Self Care, Orthotic/Fit training, Dry Needling, Moist heat, Manual lymph drainage, Manual therapy, Re-evaluation, and    PLAN FOR NEXT SESSION:  assess response to DN #1, consider [employer], needs review of TB exs before given for HEP,Review MLD for right inferior axillary swelling,Postural education, STM bilateral UT, Right pectorals, lateral trunk, check compression bra if she wears,  manual traction/?cervical traction,, progress HEP as able and progress to strength when feeling better, median nerve glides  Lavinia Sharps, PT 11/22/22 8:48 AM Phone: [phone] Fax: [phone]

## 2022-11-22 NOTE — Patient Instructions (Signed)

## 2022-11-24 NOTE — Therapy (Signed)
OUTPATIENT PHYSICAL THERAPY  UPPER EXTREMITY ONCOLOGY TREATMENT  Patient Name: Christie Fox MRN: [id number] DOB:Apr 26, 1955, 67 y.o., female Today's Date: 11/27/2022  END OF SESSION:  PT End of Session - 11/27/22 0839     Visit Number 9    Number of Visits 12    Date for PT Re-Evaluation 12/04/22    Authorization Type humana    Authorization Time Period 10/23/22-12/04/2022    Authorization - Visit Number 9    Authorization - Number of Visits 12    PT Start Time 0846    PT Stop Time 0935    PT Time Calculation (min) 49 min    Activity Tolerance Patient tolerated treatment well    Behavior During Therapy WFL for tasks assessed/performed              Past Medical History:  Diagnosis Date   Anxiety    Arthritis    knees   At risk for sleep apnea    STOP-BANG= 4    SENT TO PCP 07-31-2013   Borderline diabetes    diet controlled   Breast cancer (HCC)    Dysuria    History of kidney stones    Hyperlipidemia    Hypertension    Hypothyroidism    Migraines    PONV (postoperative nausea and vomiting)    "please call me Noehmi to wake me up , not Mrs Younge"    Renal calculus, left    Urgency of urination    Past Surgical History:  Procedure Laterality Date   BREAST LUMPECTOMY WITH RADIOACTIVE SEED LOCALIZATION Right 02/27/2022   Procedure: RIGHT BREAST LUMPECTOMY WITH RADIOACTIVE SEED LOCALIZATION X3;  Surgeon: Emelia Loron, MD;  Location: [facility];  Service: General;  Laterality: Right;   CESAREAN SECTION  x2   CYSTOSCOPY WITH RETROGRADE PYELOGRAM, URETEROSCOPY AND STENT PLACEMENT Left 08/04/2013   Procedure: 1ST STAGE CYSTOSCOPY WITH RETROGRADE PYELOGRAM, URETEROSCOPY AND STENT PLACEMENT;  Surgeon: Sebastian Ache, MD;  Location: [facility];  Service: Urology;  Laterality: Left;   CYSTOSCOPY WITH RETROGRADE PYELOGRAM, URETEROSCOPY AND STENT PLACEMENT Left 08/20/2013   Procedure: 2ND STAGE CYSTOSCOPY WITH RETROGRADE PYELOGRAM, URETEROSCOPY BASKET STONES  AND STENT EXCHANGE, BLOOD MOP;  Surgeon: Sebastian Ache, MD;  Location[facility];  Service: Urology;  Laterality: Left;   EXPLORATORY LAPAROTOMY W/ BILATERAL SALPINOOPHORECTOMY  11-27-2001   HOLMIUM LASER APPLICATION Left 08/04/2013   Procedure: HOLMIUM LASER APPLICATION;  Surgeon: Sebastian Ache, MD;  Location: [facility];  Service: Urology;  Laterality: Left;   LAPAROSCOPIC GASTRIC SLEEVE RESECTION N/A 03/27/2017   Procedure: LAPAROSCOPIC GASTRIC SLEEVE RESECTION WITH UPPER ENDO;  Surgeon: Ovidio Kin, MD;  Location: [facility];  Service: General;  Laterality: N/A;   LEFT URETEROSCOPIC LASER LITHO STONE EXTRACTION AND STENT PLACEMENT  03-25-2005   LUMBAR FUSION  X2   LAST ONE 2000   ORIF LEFT ANKLE FX  03-20-2000   RETAINED HARDWARE   RADIOACTIVE SEED GUIDED EXCISIONAL BREAST BIOPSY Left 02/27/2022   Procedure: RADIOACTIVE SEED GUIDED EXCISIONAL LEFT BREAST BIOPSY;  Surgeon: Emelia Loron, MD;  Location: [facility];  Service: General;  Laterality: Left;   VAGINAL HYSTERECTOMY  1999   WISDOM TOOTH EXTRACTION     Patient Active Problem List   Diagnosis Date Noted   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) 12/30/2021   Hyperlipidemia 08/19/2014   Hypertension 08/19/2014   Hypothyroidism 08/19/2014   Primary osteoarthritis of both knees 08/19/2014    PCP: Lelon Mast  Rene Kocher, MD  REFERRING PROVIDER: Mancel Parsons, MD  REFERRING DIAG: Cervical and Right UE pain s/p Right breast Cancer with radiation  THERAPY DIAG:  Cervicalgia  Stiffness of right shoulder, not elsewhere classified  Abnormal posture  ONSET DATE: 05/2022  Rationale for Evaluation and Treatment: Rehabilitation  SUBJECTIVE:                                                                                                                                                                                           SUBJECTIVE STATEMENT:  I was a little sore the day of  treatment. The next day I was in pain.  Then Friday was better.  EVAL Pts noticed pain in her shoulders approximately a month after radiation to the right axillary region post Right breast Cancer Surgery, and then started having throbbing pain in right UE, with tingling in right median nerve distribution. She feels a lot of tightness in the axillary region and notes swelling at the lateral trunk. She doesn't notice swelling in the right breast but the nipple gets chaffed. By 4:00 her bra has to come off. Pt has pain in bilateral UT and neck and difficulty turning. She feels weakness in her hand and has difficulty grasping things to write and carry.  PERTINENT HISTORY:  Pt with pain involving her neck and right shoulder, right arm, and right hand. Patient has a history significant for breast cancer with bilateral Lumpectomies; Right for DCIS, left with a Complex Sclerosing Lesion (Benign) and 0/1 incidental nodes. She had Adjuvant radiation on the right ending on 05/08/2022. Neck pain started 1-3 months after radiation. It is sharp, stabbing, aching and constant. Noted to have Diffuse DDD, Trace anterolisthesis at C3-C4 and C4-C5, and 2-3 mm anterolisthesis at C7-T1. Trace retrolisthesis at C6-C7,. Multilevel degenerative disc changes with disc space height loss most pronounced and moderate to severe at C5-C6 and C6-C7,Multilevel uncovertebral spurring and facet arthropathy throughout cervical spine.  PAIN:  Are you having pain? Yes NPRS scale: 7/10  Pain location:right shoulders neck right lower scapula, right neck Pain orientation: Right  PAIN TYPE: burning, sharp, throbbing, tight, and tingling Pain description:intermittent Aggravating factors: disturbed sleep, household chores,reaching with right UE,  Relieving factors: muscle relaxers some help  PRECAUTIONS: Prior foot ,ankle (rods and pins) Left LE swelling,, back surgery, gastric sleeve, hysterectomy, pain in bilateral knees  RED FLAGS: Bowel  or bladder incontinence: Yes:      WEIGHT BEARING RESTRICTIONS: No  FALLS:  Has patient fallen in last 6 months? 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[place]  HAND DOMINANCE: right   PRIOR LEVEL OF FUNCTION: Independent  PATIENT GOALS: Be able to function better   OBJECTIVE:  COGNITION: Overall cognitive status: Within functional limits for tasks assessed   PALPATION: Very tender bilateral UT, cervical region, Bilateral pectorals and scapula greatest on the right and  right lateral trunk, medial arm, forearm  OBSERVATIONS / OTHER ASSESSMENTS: bilateral scapular protraction, swelling observed inferior to Right axilla at lateral trunk  SENSATION: Light touch:   POSTURE: forward head, rounded shoulders  UPPER EXTREMITY AROM/PROM:  A/PROM RIGHT   eval  RIGHT 11/07/2022  Shoulder extension 29, pain anterior shoulder 50  Shoulder flexion 127 pain under arm and upper arm 152  Shoulder abduction 108 posterior arm 155  Shoulder internal rotation 60   Shoulder external rotation 83 shoulder joint     (Blank rows = not tested)  A/PROM LEFT   eval  Shoulder extension 33  Shoulder flexion 147  Shoulder abduction 143  Shoulder internal rotation 64  Shoulder external rotation 90    (Blank rows = not tested)  CERVICAL AROM: slight right All within normal limits:    Percent limited  Flexion WNL pain  post neck and bilateral UT  Extension Dec 20%, pain in R shoulder blade  Right lateral flexion Dec 15%, pain right UT/shoulderblade  Left lateral flexion WNL,Tight Right UT, pain left UT  Right rotation Decreased 50%,, pain right neck, SB   Left rotation Decreased 20% bilateral neck, SB    UPPER EXTREMITY STRENGTH:   LYMPHEDEMA ASSESSMENTS:   SURGERY TYPE/DATE: Bilateral breast lumpectomies;  NUMBER OF LYMPH NODES REMOVED: Left 0/1 incidental LN Right 0  LN's  CHEMOTHERAPY: No  RADIATION:Adjuvant ended 05/08/2022  HORMONE TREATMENT: recommended to take Anastrozole  INFECTIONS: NO   LYMPHEDEMA ASSESSMENTS:   LANDMARK RIGHT  eval  At axilla  35.1  15 cm proximal to olecranon process 33.7  10 cm proximal to olecranon process 32.6  Olecranon process 27.3  15 cm proximal to ulnar styloid process   10 cm proximal to ulnar styloid process 22.6  Just proximal to ulnar styloid process 16.8  Across hand at thumb web space 18.9  At base of 2nd digit 6.3  (Blank rows = not tested)  LANDMARK LEFT  eval  At axilla  34.8  15 cm proximal to olecranon process 33.0  10 cm proximal to olecranon process 29.8  Olecranon process 26.0  15 cm proximal to ulnar styloid process   10 cm proximal to ulnar styloid process 21.5  Just proximal to ulnar styloid process 16.4  Across hand at thumb web space 18.3  At base of 2nd digit 6.05  (Blank rows = not tested)   FUNCTIONAL TESTS: NT; has knee pain    QUICK DASH SURVEY: 70%   TODAY'S TREATMENT:        DATE: 11/27/2022  Cervical retraction x 10  Scapular retraction 2# 2x5 Pre-DN cervical rotation: right 42, left 60 degrees Post DN right rotation 42 degrees, left 62 R SCM stretch 2x30 sec Manual therapy: STM to right only upper traps, levator scap, periscapular muscles, cervical paraspinals and scalenes. Supine cervical traction; seated cervical traction with rotation x 5 B SNAGS into extension x 10 Skilled palpation and monitoring of soft tissues during DN. Trigger Point Dry-Needling  Treatment instructions: Expect mild to moderate muscle soreness. S/S of pneumothorax if dry needled over a lung field, and to seek immediate medical attention should they occur. Patient verbalized understanding of these  instructions and education.  Patient Consent Given: Yes Education handout provided: Yes Muscles treated:  right only upper traps, levator scap, teres,  cervical multifidi,  suboccipitals Electrical stimulation performed: No Parameters: N/A Treatment response/outcome: decreased tender point size and number, improved soft tissue mobility   11/22/2022   Pre-DN cervical rotation: right 12, left 60 degrees Post DN right rotation 28 degrees, left 62 Review of HEP: cervical retractions (to lengthen suboccipitals), cervical rotation combined with a little flexion to lengthen levator scap  Manual therapy: right only upper traps, levator scap, periscapular muscles, cervical paraspinals Trigger Point Dry-Needling  Treatment instructions: Expect mild to moderate muscle soreness. S/S of pneumothorax if dry needled over a lung field, and to seek immediate medical attention should they occur. Patient verbalized understanding of these instructions and education.  Patient Consent Given: Yes Education handout provided: Yes Muscles treated:  right only upper traps, levator scap, teres,  cervical multifidi, suboccipitals Electrical stimulation performed: No Parameters: N/A Treatment response/outcome: decreased tender point size and number, improved soft tissue mobility                                                                               DATE: 11/20/2022 Scapular retraction and shoulder ext with yellow band x 5 ea with emphasis on posture Seated cervical retraction x 7 increased no worse Supine cervical retractions 2 x 5 produced shoulder blade no worse, no change in UE Shoulder presses x 5 Supine manual traction and suboccipital release; decrease UD pain with manual traction STM to bilateral UT,  right pectorals, lateral trunk with cocoa butter PROM Right shoulder Flexion, scaption, abd, ER with multiple VC's to relax Supine bilateral AROM :shoulder flexion, scaption, and horizontal abduction MLD performed by therapist; Right supraclavicular LN's, 5 breaths, Right inguinal LN's, right axillo-inguinal pathway repeated multiple times for right inferior axillary  swelling and ending with right inguinal  LN's.  11/15/2022 Pulleys x 2 min ea flexion and abd Bilateral cervical rotation and SB x 5 ea Wall arc x 4 to the left and  right manual traction with head in several different angles ; mild change in hand, no effect on arm pain STM to bilateral UT,  right pectorals, lateral trunk with cocoa butter PROM Right shoulder Flexion, scaption, abd, ER with multiple VC's to relax MLD performed by therapist; Right supraclavicular LN's, 5 breaths, Right inguinal LN's, right axillo-inguinal pathway repeated multiple times for right inferior axillary swelling and ending with right inguinal  LN's. Had pt try same in sitting and using her left UE. She did well with practice;gave handout Discussed DN with pt; she would like to try  11/13/2022 STM to bilateral UT, pectorals, lateral trunk with cocoa butter Manual traction, suboccipital release PROM bilateral shoulders flex, abd, D2 flexion, IR and Erwith VC's to relax Supine AROM bilateral shoulder flexion, scaption, horizontal abd x 5  11/07/2022 Pulleys flexion and abduction x 2 min Wall arc bilaterally x 3 Stargazer and AAflexion x 5 STM to bilateral UT, Right pectorals, bilateral lateral trunk PROM right shoulder into flex, abd and D2 with scapular depression throughout   11/01/22: Therapeutic Exercises Ball roll up wall into flex x 10 returning therapist demo Pulleys: Flex x  2 mins and abd x 1 min Manual Therapy STM to bilateral UT, Right pectorals, then in Lt S/L to lateral trunk with cocoa butter, also to medial scap border where tightness and trigger points palpable Scap Mobs in Lt S/L into protraction and retraction, limited scap mobility initially but this improved some during session  Gentle manual cervical traction and suboccipital release PROM right shoulder into flex, abd and D2 with scapular depression throughout and with multiple VC's to relax, long arm distraction with oscillations for  relaxation during stretches    PATIENT EDUCATION:  Education details: Discussed POC including number of visits,  treatment interventions. Educated in supine AA shoulder flexion and supine stargazer, and sitting or standing scapular retraction to start gently at home to help Right shoulder ROM. Emphasized that pain should not be in shoulder joint Dry needling handout Person educated: Patient and Spouse Education method: Explanation and Handouts Education comprehension: verbalized understanding and returned demonstration  HOME EXERCISE PROGRAM: Post op exercises; AA shoulder flexion, stargazer, sitting scapular retraction Access Code: [email or web address] URL: [email or web address] Date: 11/27/2022 Prepared by: Raynelle Fanning  Exercises - Sternocleidomastoid Stretch  - 2 x daily - 7 x weekly - 1 sets - 3 reps - 30 hold  ASSESSMENT:  CLINICAL IMPRESSION: Shenette reports she had a lot of pain on day 2 after DN, but then it improved. She is still very tender and tight in he R suboccipitals, upper traps and teres in addition to SCM and scalenes. SCM stretch issued for HEP and she may benefit from DN here. No significant change in pre/post neck rotation today, but R rotation was significantly improved since last visit. No change in R UT pain with traction or SNAGS. She did have less pain when she did slight retraction with rotation. She may benefit from a trial of mechanical traction. Adryanna continues to demonstrate potential for improvement and would benefit from continued skilled therapy to address impairments.    OBJECTIVE IMPAIRMENTS: decreased activity tolerance, decreased knowledge of condition, decreased ROM, decreased strength, increased edema, increased fascial restrictions, impaired sensation, impaired UE functional use, postural dysfunction, and pain.   ACTIVITY LIMITATIONS: carrying, lifting, standing, squatting, sleeping, stairs, reach over head, and hygiene/grooming  PARTICIPATION  LIMITATIONS: cleaning, laundry, driving, shopping, and usual household chores  PERSONAL FACTORS: 1-2 comorbidities: Right breast Cancer s/p radiation  are also affecting patient's functional outcome.   REHAB POTENTIAL: Good  CLINICAL DECISION MAKING: Stable/uncomplicated  EVALUATION COMPLEXITY: Low  GOALS: Goals reviewed with patient? Yes  SHORT TERM GOALS: Target date: 11/13/2022  Pt will be independent in a HEP for ROM/strength of cervical and right UE regions Baseline: Goal status: MET, UE 2.  Pt will have decreased overall pain by 25% Baseline:  Goal status: INITIAL  3.  Pt will improve Right shoulder ROM by 20 degrees for shoulder abd, and 10 degrees for flexion for improved reaching Baseline:  Goal status: MET 11/07/2022 4.  Pt will have decreased c/o tingling in the right hand by 30% Baseline:  Goal status: INITIAL  5.  Quick dash will be improved by 20% to demonstrate improved function Baseline:  Goal status: INITIAL   LONG TERM GOALS: Target date: 12/04/2022  Pt will have decreased overall pain by 50% or greater Baseline:  Goal status: INITIAL  2.  Pts quick dash will improve to no greater than 20% to demonstrate improved function Baseline:  Goal status: INITIAL  3.  Pt will have right shoulder ROM Within 10 degrees of left for improved function Baseline:  Goal status: INITIAL  4.  Pts cervical ROM will be [facility] without increased pain Baseline:  Goal status: INITIAL  5.  Pt will be able to sleep with minimal disturbance from pain Baseline:  Goal status: INITIAL  6.  Pt will noted improved ability to grasp objects, and write with right hand Baseline:  Goal status: INITIAL  PLAN:  PT FREQUENCY: 2x/week  PT DURATION: 6 weeks  PLANNED INTERVENTIONS: Therapeutic exercises, Therapeutic activity, Neuromuscular re-education, Patient/Family education, Self Care, Orthotic/Fit training, Dry Needling, Moist heat, Manual lymph drainage, Manual therapy,  Re-evaluation, and    PLAN FOR NEXT SESSION:  assess response to DN #2, consider [employer], needs review of TB exs before given for HEP,Review MLD for right inferior axillary swelling,Postural education, STM bilateral UT, Right pectorals, lateral trunk, check compression bra if she wears,  manual traction/?cervical traction,, progress HEP as able and progress to strength when feeling better, median nerve glides  Solon Palm, PT  11/27/22 9:52 AM Phone: [phone] Fax: [phone]

## 2022-11-27 ENCOUNTER — Encounter: Payer: Self-pay | Admitting: Physical Therapy

## 2022-11-27 ENCOUNTER — Ambulatory Visit: Payer: Medicare Other | Admitting: Physical Therapy

## 2022-11-27 DIAGNOSIS — M25611 Stiffness of right shoulder, not elsewhere classified: Secondary | ICD-10-CM

## 2022-11-27 DIAGNOSIS — R293 Abnormal posture: Secondary | ICD-10-CM

## 2022-11-27 DIAGNOSIS — M542 Cervicalgia: Secondary | ICD-10-CM

## 2022-11-29 ENCOUNTER — Ambulatory Visit: Payer: Medicare Other

## 2022-11-29 DIAGNOSIS — R600 Localized edema: Secondary | ICD-10-CM

## 2022-11-29 DIAGNOSIS — M542 Cervicalgia: Secondary | ICD-10-CM

## 2022-11-29 DIAGNOSIS — M25611 Stiffness of right shoulder, not elsewhere classified: Secondary | ICD-10-CM

## 2022-11-29 DIAGNOSIS — R293 Abnormal posture: Secondary | ICD-10-CM

## 2022-11-29 DIAGNOSIS — Z923 Personal history of irradiation: Secondary | ICD-10-CM

## 2022-11-29 DIAGNOSIS — Z170 Estrogen receptor positive status [ER+]: Secondary | ICD-10-CM

## 2022-11-29 NOTE — Therapy (Signed)
OUTPATIENT PHYSICAL THERAPY  UPPER EXTREMITY ONCOLOGY TREATMENT  Patient Name: Christie Fox MRN: [id number] DOB:07/28/1955, 67 y.o., female Today's Date: 11/29/2022  END OF SESSION:  PT End of Session - 11/29/22 0753     Visit Number 10    Number of Visits 18    Date for PT Re-Evaluation 12/27/22    Authorization Type [employer]    Authorization Time Period 10/23/22-12/04/2022    Authorization - Visit Number 10    Authorization - Number of Visits 12    PT Start Time 0757    PT Stop Time 0900    PT Time Calculation (min) 63 min    Activity Tolerance Patient tolerated treatment well    Behavior During Therapy WFL for tasks assessed/performed              Past Medical History:  Diagnosis Date   Anxiety    Arthritis    knees   At risk for sleep apnea    STOP-BANG= 4    SENT TO PCP 07-31-2013   Borderline diabetes    diet controlled   Breast cancer (HCC)    Dysuria    History of kidney stones    Hyperlipidemia    Hypertension    Hypothyroidism    Migraines    PONV (postoperative nausea and vomiting)    "please call me Alayia to wake me up , not Mrs Choquette"    Renal calculus, left    Urgency of urination    Past Surgical History:  Procedure Laterality Date   BREAST LUMPECTOMY WITH RADIOACTIVE SEED LOCALIZATION Right 02/27/2022   Procedure: RIGHT BREAST LUMPECTOMY WITH RADIOACTIVE SEED LOCALIZATION X3;  Surgeon: Emelia Loron, MD;  Location: [facility];  Service: General;  Laterality: Right;   CESAREAN SECTION  x2   CYSTOSCOPY WITH RETROGRADE PYELOGRAM, URETEROSCOPY AND STENT PLACEMENT Left 08/04/2013   Procedure: 1ST STAGE CYSTOSCOPY WITH RETROGRADE PYELOGRAM, URETEROSCOPY AND STENT PLACEMENT;  Surgeon: Sebastian Ache, MD;  Location: [facility];  Service: Urology;  Laterality: Left;   CYSTOSCOPY WITH RETROGRADE PYELOGRAM, URETEROSCOPY AND STENT PLACEMENT Left 08/20/2013   Procedure: 2ND STAGE CYSTOSCOPY WITH RETROGRADE PYELOGRAM, URETEROSCOPY BASKET STONES  AND STENT EXCHANGE, BLOOD MOP;  Surgeon: Sebastian Ache, MD;  Location: [facility];  Service: Urology;  Laterality: Left;   EXPLORATORY LAPAROTOMY W/ BILATERAL SALPINOOPHORECTOMY  11-27-2001   HOLMIUM LASER APPLICATION Left 08/04/2013   Procedure: HOLMIUM LASER APPLICATION;  Surgeon: Sebastian Ache, MD;  Location: [facility];  Service: Urology;  Laterality: Left;   LAPAROSCOPIC GASTRIC SLEEVE RESECTION N/A 03/27/2017   Procedure: LAPAROSCOPIC GASTRIC SLEEVE RESECTION WITH UPPER ENDO;  Surgeon: Ovidio Kin, MD;  Location: [facility];  Service: General;  Laterality: N/A;   LEFT URETEROSCOPIC LASER LITHO STONE EXTRACTION AND STENT PLACEMENT  03-25-2005   LUMBAR FUSION  X2   LAST ONE 2000   ORIF LEFT ANKLE FX  03-20-2000   RETAINED HARDWARE   RADIOACTIVE SEED GUIDED EXCISIONAL BREAST BIOPSY Left 02/27/2022   Procedure: RADIOACTIVE SEED GUIDED EXCISIONAL LEFT BREAST BIOPSY;  Surgeon: Emelia Loron, MD;  Location:  [facility];  Service: General;  Laterality: Left;   VAGINAL HYSTERECTOMY  1999   WISDOM TOOTH EXTRACTION     Patient Active Problem List   Diagnosis Date Noted   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) 12/30/2021   Hyperlipidemia 08/19/2014   Hypertension 08/19/2014   Hypothyroidism 08/19/2014   Primary osteoarthritis of both knees 08/19/2014    PCP: Lelon Mast  Rene Kocher, MD  REFERRING PROVIDER: Mancel Parsons, MD  REFERRING DIAG: Cervical and Right UE pain s/p Right breast Cancer with radiation  THERAPY DIAG:  Cervicalgia  Stiffness of right shoulder, not elsewhere classified  Abnormal posture  Status post radiation therapy  Malignant neoplasm of upper-outer quadrant of right breast in female, estrogen receptor positive (HCC)  Localized edema  ONSET DATE: 05/2022  Rationale for Evaluation and Treatment: Rehabilitation  SUBJECTIVE:                                                                                                                                                                                            SUBJECTIVE STATEMENT:  I am really sore after the DN on Monday.  I was in more pain Monday after I left than when I got there. I could barely turn my head after the DN. I have much better mobility in my arms. My neck and shoulders are still very tight and I feel pulling in my chest with scapular retraction. My right arm still hurts in my shoulder and under the arm, but the pain that was in the forearm and hand is gone. I still have numbness in my3rd and 4th finger. Pt continues to have sleep disturbances and is unable to rwrite or grip things with her right hand.  EVAL Pts noticed pain in her shoulders approximately a month after radiation to the right axillary region post Right breast Cancer Surgery, and then started having throbbing pain in right UE, with tingling in right median nerve distribution. She feels a lot of tightness in the axillary region and notes swelling at the lateral trunk. She doesn't notice swelling in the right breast but the nipple gets chaffed. By 4:00 her bra has to come off. Pt has pain in bilateral UT and neck and difficulty turning. She feels weakness in her hand and has difficulty grasping things to write and carry.  PERTINENT HISTORY:  Pt with pain involving her neck and right shoulder, right arm, and right hand. Patient has a history significant for breast cancer with bilateral Lumpectomies; Right for DCIS, left with a Complex Sclerosing Lesion (Benign) and 0/1 incidental nodes. She had Adjuvant radiation on the right ending on 05/08/2022. Neck pain started 1-3 months after radiation. It is sharp, stabbing, aching and constant. Noted to have Diffuse DDD, Trace anterolisthesis at C3-C4 and C4-C5, and 2-3 mm anterolisthesis at C7-T1. Trace retrolisthesis at C6-C7,. Multilevel degenerative disc changes with disc space height loss most pronounced and moderate to severe at C5-C6 and  C6-C7,Multilevel uncovertebral spurring and facet arthropathy throughout cervical spine.  PAIN:  Are you having pain?  Yes NPRS scale: 7/10  Pain location:right shoulder neck right lower scapula, right neck Pain orientation: Right  PAIN TYPE: burning, sharp, throbbing, tight, and tingling Pain description:intermittent Aggravating factors: disturbed sleep, household chores,reaching with right UE,  Relieving factors: muscle relaxers some help  PRECAUTIONS: Prior foot ,ankle (rods and pins) Left LE swelling,, back surgery, gastric sleeve, hysterectomy, pain in bilateral knees  RED FLAGS: Bowel or bladder incontinence: Yes:      WEIGHT BEARING RESTRICTIONS: No  FALLS:  Has patient fallen in last 6 months? 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[place]  HAND DOMINANCE: right   PRIOR LEVEL OF FUNCTION: Independent  PATIENT GOALS: Be able to function better   OBJECTIVE:  COGNITION: Overall cognitive status: Within functional limits for tasks assessed   PALPATION: Very tender bilateral UT, cervical region, Bilateral pectorals and scapula greatest on the right and  right lateral trunk, medial arm, forearm  OBSERVATIONS / OTHER ASSESSMENTS: bilateral scapular protraction, swelling observed inferior to Right axilla at lateral trunk  SENSATION: Light touch:   POSTURE: forward head, rounded shoulders  UPPER EXTREMITY AROM/PROM:  A/PROM RIGHT   eval  RIGHT 11/07/2022 RIGHT 11/29/2022  Shoulder extension 29, pain anterior shoulder 50 46 pain in shoulder blade  Shoulder flexion 127 pain under arm and upper arm 152 152  Shoulder abduction 108 posterior arm 155 154 pain at right lateral trunk  Shoulder internal rotation 60    Shoulder external rotation 83 shoulder joint      (Blank rows = not tested)  A/PROM LEFT   eval  Shoulder extension 33  Shoulder flexion 147   Shoulder abduction 143  Shoulder internal rotation 64  Shoulder external rotation 90    (Blank rows = not tested)  CERVICAL AROM: slight right All within normal limits:    Percent limited 11/29/2022  Flexion WNL pain  post neck and bilateral UT WdNL no pain  Extension Dec 20%, pain in R shoulder blade Dec 20% crepitus  Right lateral flexion Dec 15%, pain right UT/shoulderblade Decreased 60% crepitus/pain  Left lateral flexion WNL,Tight Right UT, pain left UT Decreased 40%, pain  Right rotation Decreased 50%,, pain right neck, SB  Decreased 40% tight, pain into neck  Left rotation Decreased 20% bilateral neck, SB Decreased 10%    UPPER EXTREMITY STRENGTH:   LYMPHEDEMA ASSESSMENTS:   SURGERY TYPE/DATE: Bilateral breast lumpectomies;  NUMBER OF LYMPH NODES REMOVED: Left 0/1 incidental LN Right 0 LN's  CHEMOTHERAPY: No  RADIATION:Adjuvant ended 05/08/2022  HORMONE TREATMENT: recommended to take Anastrozole  INFECTIONS: NO   LYMPHEDEMA ASSESSMENTS:   LANDMARK RIGHT  eval  At axilla  35.1  15 cm proximal to olecranon process 33.7  10 cm proximal to olecranon process 32.6  Olecranon process 27.3  15 cm proximal to ulnar styloid process   10 cm proximal to ulnar styloid process 22.6  Just proximal to ulnar styloid process 16.8  Across hand at thumb web space 18.9  At base of 2nd digit 6.3  (Blank rows = not tested)  LANDMARK LEFT  eval  At axilla  34.8  15 cm proximal to olecranon process 33.0  10 cm proximal to olecranon process 29.8  Olecranon process 26.0  15 cm proximal to ulnar styloid process   10 cm proximal to ulnar styloid process 21.5  Just proximal to ulnar styloid process 16.4  Across hand at thumb web space 18.3  At base of 2nd digit 6.05  (Blank rows = not tested)   FUNCTIONAL TESTS: NT; has knee pain    QUICK DASH SURVEY: 70%   TODAY'S TREATMENT:        DATE: 11/29/2022 Reviewed goals and assessed shoulder/neck ROM Cervical rotation x  5 B STW bilateral UT, levator, upper scapular region and posterior cervicals with cocoa butter Manual traction and suboccipital release [facility] traction with pt positioned several times to get comfortable started with 10 lb pull, but able to increase to 15# static hold x 7 min then complained of intermittent shooting pain into head and was discontinued. Despite discontinuing pt liked the traction and would like to try it again.    11/27/2022  Cervical retraction x 10  Scapular retraction 2# 2x5 Pre-DN cervical rotation: right 42, left 60 degrees Post DN right rotation 42 degrees, left 62 R SCM stretch 2x30 sec Manual therapy: STM to right only upper traps, levator scap, periscapular muscles, cervical paraspinals and scalenes. Supine cervical traction; seated cervical traction with rotation x 5 B SNAGS into extension x 10 Skilled palpation and monitoring of soft tissues during DN. Trigger Point Dry-Needling  Treatment instructions: Expect mild to moderate muscle soreness. S/S of pneumothorax if dry needled over a lung field, and to seek immediate medical attention should they occur. Patient verbalized understanding of these instructions and education.  Patient Consent Given: Yes Education handout provided: Yes Muscles treated:  right only upper traps, levator scap, teres,  cervical multifidi, suboccipitals Electrical stimulation performed: No Parameters: N/A Treatment response/outcome: decreased tender point size and number, improved soft tissue mobility   11/22/2022   Pre-DN cervical rotation: right 12, left 60 degrees Post DN right rotation 28 degrees, left 62 Review of HEP: cervical retractions (to lengthen suboccipitals), cervical rotation combined with a little flexion to lengthen levator scap  Manual therapy: right only upper traps, levator scap, periscapular muscles, cervical paraspinals Trigger Point Dry-Needling  Treatment instructions: Expect mild to moderate muscle  soreness. S/S of pneumothorax if dry needled over a lung field, and to seek immediate medical attention should they occur. Patient verbalized understanding of these instructions and education.  Patient Consent Given: Yes Education handout provided: Yes Muscles treated:  right only upper traps, levator scap, teres,  cervical multifidi, suboccipitals Electrical stimulation performed: No Parameters: N/A Treatment response/outcome: decreased tender point size and number, improved soft tissue mobility                                                                               DATE: 11/20/2022 Scapular retraction and shoulder ext with yellow band x 5 ea with emphasis on posture Seated cervical retraction x 7 increased no worse Supine cervical retractions 2 x 5 produced shoulder blade no worse, no change in UE Shoulder presses x 5 Supine manual traction and suboccipital release; decrease UD pain with manual traction STM to bilateral UT,  right pectorals, lateral trunk with cocoa butter PROM Right shoulder Flexion, scaption, abd, ER with multiple VC's to relax Supine bilateral AROM :shoulder flexion, scaption, and horizontal abduction MLD performed by therapist; Right supraclavicular LN's, 5 breaths, Right inguinal LN's, right axillo-inguinal pathway repeated multiple times for right inferior axillary  swelling and ending with right inguinal  LN's.  11/15/2022 Pulleys x 2 min ea flexion and abd Bilateral cervical rotation and SB x 5 ea Wall arc x 4 to the left and  right manual traction with head in several different angles ; mild change in hand, no effect on arm pain STM to bilateral UT,  right pectorals, lateral trunk with cocoa butter PROM Right shoulder Flexion, scaption, abd, ER with multiple VC's to relax MLD performed by therapist; Right supraclavicular LN's, 5 breaths, Right inguinal LN's, right axillo-inguinal pathway repeated multiple times for right inferior axillary swelling and  ending with right inguinal  LN's. Had pt try same in sitting and using her left UE. She did well with practice;gave handout Discussed DN with pt; she would like to try  11/13/2022 STM to bilateral UT, pectorals, lateral trunk with cocoa butter Manual traction, suboccipital release PROM bilateral shoulders flex, abd, D2 flexion, IR and Erwith VC's to relax Supine AROM bilateral shoulder flexion, scaption, horizontal abd x 5     PATIENT EDUCATION:  Education details: Discussed POC including number of visits,  treatment interventions. Educated in supine AA shoulder flexion and supine stargazer, and sitting or standing scapular retraction to start gently at home to help Right shoulder ROM. Emphasized that pain should not be in shoulder joint Dry needling handout Person educated: Patient and Spouse Education method: Explanation and Handouts Education comprehension: verbalized understanding and returned demonstration  HOME EXERCISE PROGRAM: Post op exercises; AA shoulder flexion, stargazer, sitting scapular retraction Access Code: [email or web address] URL: [email or web address] Date: 11/27/2022 Prepared by: Raynelle Fanning  Exercises - Sternocleidomastoid Stretch  - 2 x daily - 7 x weekly - 1 sets - 3 reps - 30 hold  ASSESSMENT:  CLINICAL IMPRESSION: Pt has achieved all STG's except number 5 which was not assessed and LTG number 3 for Shoulder ROM. Pt is no longer having pain down the right UE, but does still have complaints of tingling in the 3rd and 4th finger and concerns of weakness and dropping things. She is working on hand strength at home. Her overall pain has improved 25% and tingling 30% since starting therapy. She does still have significant UT, posterior cervical and suboccipital restriction with trigger points noted especially in suboccipitals. She did feel the trial of cervical traction was helpful despite intermittent shooting pain into the head and is interested in trying it several  more times to see if it is beneficial. She will benefit from continued skilled PT to determine benefit of DN after 3rd visit, and traction, which may be a good modality for her to have at home if helpful. If no continued improvement pt will be discharged and advised to see MD.  OBJECTIVE IMPAIRMENTS: decreased activity tolerance, decreased knowledge of condition, decreased ROM, decreased strength, increased edema, increased fascial restrictions, impaired sensation, impaired UE functional use, postural dysfunction, and pain.   ACTIVITY LIMITATIONS: carrying, lifting, standing, squatting, sleeping, stairs, reach over head, and hygiene/grooming  PARTICIPATION LIMITATIONS: cleaning, laundry, driving, shopping, and usual household chores  PERSONAL FACTORS: 1-2 comorbidities: Right breast Cancer s/p radiation  are also affecting patient's functional outcome.   REHAB POTENTIAL: Good  CLINICAL DECISION MAKING: Stable/uncomplicated  EVALUATION COMPLEXITY: Low  GOALS: Goals reviewed with patient? Yes  SHORT TERM GOALS: Target date: 11/13/2022  Pt will be independent in a HEP for ROM/strength of cervical and right UE regions Baseline: Goal status: MET, 11/29/2022  2.  Pt will have decreased overall pain by 25% Baseline:  Goal status: MET 11/29/2022  3.  Pt will improve Right shoulder ROM by 20 degrees for shoulder abd, and 10 degrees for flexion for improved reaching Baseline:  Goal status: MET 11/07/2022 4.  Pt will have decreased c/o tingling in the right hand by 30% Baseline:  Goal status: MET 11/29/2022  5.  Quick dash will be improved by 20% to demonstrate improved function Baseline:  Goal status: INITIAL   LONG TERM GOALS: Target date: 12/04/2022  Pt will have decreased overall pain by 50% or greater Baseline:  Goal status: IN progress 2.  Pts quick dash will improve to no greater than 20% to demonstrate improved function Baseline:  Goal status: INITIAL  3.  Pt will have  right shoulder ROM Within 10 degrees of left for improved function Baseline:  Goal status: MET 4.  Pts cervical ROM will be [facility] without increased pain Baseline:  Goal status: In Progress  5.  Pt will be able to sleep with minimal disturbance from pain Baseline:  Goal status: In Progress 6.  Pt will noted improved ability to grasp objects, and write with right hand Baseline:  Goal status: In Progress  PLAN:  PT FREQUENCY: 2x/week  PT DURATION:4 weeks PLANNED INTERVENTIONS: Therapeutic exercises, Therapeutic activity, Neuromuscular re-education, Patient/Family education, Self Care, Orthotic/Fit training, Dry Needling, Moist heat, Manual lymph drainage, Manual therapy, Re-evaluation, and    PLAN FOR NEXT SESSION:  assess response to DN #2, conntinue [facility] Trac,c, needs review of TB exs before given for HEP,Review MLD for right inferior axillary swelling,Postural education, STM bilateral UT, Right pectorals, lateral trunk,  , progress HEP as able and progress to strength when feeling better, median nerve glides  Alvira Monday, PT  11/29/22 1:12 PM Phone: [phone] Fax: [phone]

## 2022-12-03 NOTE — Therapy (Signed)
OUTPATIENT PHYSICAL THERAPY  UPPER EXTREMITY ONCOLOGY TREATMENT  Patient Name: Christie Fox MRN: [id number] DOB:01/31/1955, 67 y.o., female Today's Date: 12/04/2022  END OF SESSION:  PT End of Session - 12/04/22 1104     Visit Number 11    Number of Visits 18    Date for PT Re-Evaluation 12/27/22    Authorization Type [employer]    Authorization Time Period 10/23/22-12/04/2022    Authorization - Visit Number 11    Authorization - Number of Visits 12    PT Start Time 1104    PT Stop Time 1150    PT Time Calculation (min) 46 min    Activity Tolerance Patient tolerated treatment well    Behavior During Therapy WFL for tasks assessed/performed               Past Medical History:  Diagnosis Date   Anxiety    Arthritis    knees   At risk for sleep apnea    STOP-BANG= 4    SENT TO PCP 07-31-2013   Borderline diabetes    diet controlled   Breast cancer (HCC)    Dysuria    History of kidney stones    Hyperlipidemia    Hypertension    Hypothyroidism    Migraines    PONV (postoperative nausea and vomiting)    "please call me Jull to wake me up , not Mrs Haber"    Renal calculus, left    Urgency of urination    Past Surgical History:  Procedure Laterality Date   BREAST LUMPECTOMY WITH RADIOACTIVE SEED LOCALIZATION Right 02/27/2022   Procedure: RIGHT BREAST LUMPECTOMY WITH RADIOACTIVE SEED LOCALIZATION X3;  Surgeon: Emelia Loron, MD;  Location: [facility];  Service: General;  Laterality: Right;   CESAREAN SECTION  x2   CYSTOSCOPY WITH RETROGRADE PYELOGRAM, URETEROSCOPY AND STENT PLACEMENT Left 08/04/2013   Procedure: 1ST STAGE CYSTOSCOPY WITH RETROGRADE PYELOGRAM, URETEROSCOPY AND STENT PLACEMENT;  Surgeon: Sebastian Ache, MD;  Location: [facility];  Service: Urology;  Laterality: Left;   CYSTOSCOPY WITH RETROGRADE PYELOGRAM, URETEROSCOPY AND STENT PLACEMENT Left 08/20/2013   Procedure: 2ND STAGE CYSTOSCOPY WITH RETROGRADE PYELOGRAM, URETEROSCOPY BASKET STONES  AND STENT EXCHANGE, BLOOD MOP;  Surgeon: Sebastian Ache, MD;  Location: [facility];  Service: Urology;  Laterality: Left;   EXPLORATORY LAPAROTOMY W/ BILATERAL SALPINOOPHORECTOMY  11-27-2001   HOLMIUM LASER APPLICATION Left 08/04/2013   Procedure: HOLMIUM LASER APPLICATION;  Surgeon: Sebastian Ache, MD;  Location: [facility];  Service: Urology;  Laterality: Left;   LAPAROSCOPIC GASTRIC SLEEVE RESECTION N/A 03/27/2017   Procedure: LAPAROSCOPIC GASTRIC SLEEVE RESECTION WITH UPPER ENDO;  Surgeon: Ovidio Kin, MD;  Location: [facility];  Service: General;  Laterality: N/A;   LEFT URETEROSCOPIC LASER LITHO STONE EXTRACTION AND STENT PLACEMENT  03-25-2005   LUMBAR FUSION  X2   LAST ONE 2000   ORIF LEFT ANKLE FX  03-20-2000   RETAINED HARDWARE   RADIOACTIVE SEED GUIDED EXCISIONAL BREAST BIOPSY Left 02/27/2022   Procedure: RADIOACTIVE SEED GUIDED EXCISIONAL LEFT BREAST BIOPSY;  Surgeon: Emelia Loron, MD;  Location:  [facility];  Service: General;  Laterality: Left;   VAGINAL HYSTERECTOMY  1999   WISDOM TOOTH EXTRACTION     Patient Active Problem List   Diagnosis Date Noted   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) 12/30/2021   Hyperlipidemia 08/19/2014   Hypertension 08/19/2014   Hypothyroidism 08/19/2014   Primary osteoarthritis of both knees 08/19/2014    PCP:  Brett Fairy, MD  REFERRING PROVIDER: Mancel Parsons, MD  REFERRING DIAG: Cervical and Right UE pain s/p Right breast Cancer with radiation  THERAPY DIAG:  Cervicalgia  Stiffness of right shoulder, not elsewhere classified  Abnormal posture  Status post radiation therapy  Malignant neoplasm of upper-outer quadrant of right breast in female, estrogen receptor positive (HCC)  ONSET DATE: 05/2022  Rationale for Evaluation and Treatment: Rehabilitation  SUBJECTIVE:                                                                                                                                                                                            SUBJECTIVE STATEMENT:  I am doing my homework. My hand is better than it has been in a while. The traction may be why my spine is hurting. My reconstructive surgery will start on 12/25/22.   EVAL Pts noticed pain in her shoulders approximately a month after radiation to the right axillary region post Right breast Cancer Surgery, and then started having throbbing pain in right UE, with tingling in right median nerve distribution. She feels a lot of tightness in the axillary region and notes swelling at the lateral trunk. She doesn't notice swelling in the right breast but the nipple gets chaffed. By 4:00 her bra has to come off. Pt has pain in bilateral UT and neck and difficulty turning. She feels weakness in her hand and has difficulty grasping things to write and carry.  PERTINENT HISTORY:  Pt with pain involving her neck and right shoulder, right arm, and right hand. Patient has a history significant for breast cancer with bilateral Lumpectomies; Right for DCIS, left with a Complex Sclerosing Lesion (Benign) and 0/1 incidental nodes. She had Adjuvant radiation on the right ending on 05/08/2022. Neck pain started 1-3 months after radiation. It is sharp, stabbing, aching and constant. Noted to have Diffuse DDD, Trace anterolisthesis at C3-C4 and C4-C5, and 2-3 mm anterolisthesis at C7-T1. Trace retrolisthesis at C6-C7,. Multilevel degenerative disc changes with disc space height loss most pronounced and moderate to severe at C5-C6 and C6-C7,Multilevel uncovertebral spurring and facet arthropathy throughout cervical spine.  PAIN:  Are you having pain? Yes NPRS scale: 7/10  Pain location:right shoulder neck right lower scapula, right neck Pain orientation: Right  PAIN TYPE: burning, sharp, throbbing, tight, and tingling Pain description:intermittent Aggravating factors: disturbed sleep, household chores,reaching with right UE,   Relieving factors: muscle relaxers some help  PRECAUTIONS: Prior foot ,ankle (rods and pins) Left LE swelling,, back surgery, gastric sleeve, hysterectomy, pain in bilateral knees  RED FLAGS: Bowel or bladder incontinence: Yes:      WEIGHT BEARING  RESTRICTIONS: No  FALLS:  Has patient fallen in last 6 months? 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[place]  HAND DOMINANCE: right   PRIOR LEVEL OF FUNCTION: Independent  PATIENT GOALS: Be able to function better   OBJECTIVE:  COGNITION: Overall cognitive status: Within functional limits for tasks assessed   PALPATION: Very tender bilateral UT, cervical region, Bilateral pectorals and scapula greatest on the right and  right lateral trunk, medial arm, forearm  OBSERVATIONS / OTHER ASSESSMENTS: bilateral scapular protraction, swelling observed inferior to Right axilla at lateral trunk  SENSATION: Light touch:   POSTURE: forward head, rounded shoulders  UPPER EXTREMITY AROM/PROM:  A/PROM RIGHT   eval  RIGHT 11/07/2022 RIGHT 11/29/2022  Shoulder extension 29, pain anterior shoulder 50 46 pain in shoulder blade  Shoulder flexion 127 pain under arm and upper arm 152 152  Shoulder abduction 108 posterior arm 155 154 pain at right lateral trunk  Shoulder internal rotation 60    Shoulder external rotation 83 shoulder joint      (Blank rows = not tested)  A/PROM LEFT   eval  Shoulder extension 33  Shoulder flexion 147  Shoulder abduction 143  Shoulder internal rotation 64  Shoulder external rotation 90    (Blank rows = not tested)  CERVICAL AROM: slight right All within normal limits:    Percent limited 11/29/2022  Flexion WNL pain  post neck and bilateral UT WdNL no pain  Extension Dec 20%, pain in R shoulder blade Dec 20% crepitus  Right lateral flexion Dec 15%, pain right UT/shoulderblade Decreased 60%  crepitus/pain  Left lateral flexion WNL,Tight Right UT, pain left UT Decreased 40%, pain  Right rotation Decreased 50%,, pain right neck, SB  Decreased 40% tight, pain into neck  Left rotation Decreased 20% bilateral neck, SB Decreased 10%    UPPER EXTREMITY STRENGTH:   LYMPHEDEMA ASSESSMENTS:   SURGERY TYPE/DATE: Bilateral breast lumpectomies;  NUMBER OF LYMPH NODES REMOVED: Left 0/1 incidental LN Right 0 LN's  CHEMOTHERAPY: No  RADIATION:Adjuvant ended 05/08/2022  HORMONE TREATMENT: recommended to take Anastrozole  INFECTIONS: NO   LYMPHEDEMA ASSESSMENTS:   LANDMARK RIGHT  eval  At axilla  35.1  15 cm proximal to olecranon process 33.7  10 cm proximal to olecranon process 32.6  Olecranon process 27.3  15 cm proximal to ulnar styloid process   10 cm proximal to ulnar styloid process 22.6  Just proximal to ulnar styloid process 16.8  Across hand at thumb web space 18.9  At base of 2nd digit 6.3  (Blank rows = not tested)  LANDMARK LEFT  eval  At axilla  34.8  15 cm proximal to olecranon process 33.0  10 cm proximal to olecranon process 29.8  Olecranon process 26.0  15 cm proximal to ulnar styloid process   10 cm proximal to ulnar styloid process 21.5  Just proximal to ulnar styloid process 16.4  Across hand at thumb web space 18.3  At base of 2nd digit 6.05  (Blank rows = not tested)   FUNCTIONAL TESTS: NT; has knee pain    QUICK DASH SURVEY: 70%   TODAY'S TREATMENT:        DATE: 12/04/22 Seated nerve glide in prayer position x 10 Standing radial nerve glide with head tilt x 10 R SCM stretch 2 x 30 sec R Manual: Skilled palpation and monitoring of soft tissues during DN  Trigger Point  Dry-Needling  Treatment instructions: Expect mild to moderate muscle soreness. S/S of pneumothorax if dry needled over a lung field, and to seek immediate medical attention should they occur. Patient verbalized understanding of these instructions and  education.  Patient Consent Given: Yes Education handout provided: Yes Muscles treated:  R levator scap and B suboccipitals Electrical stimulation performed: No Parameters: N/A Treatment response/outcome: decreased tissue tension  STM to bilateral UT, levator, upper scapular region and posterior cervicals  [facility] traction 13-15 lb pull static hold x  10 min  11/29/2022 Reviewed goals and assessed shoulder/neck ROM Cervical rotation x 5 B STW bilateral UT, levator, upper scapular region and posterior cervicals with cocoa butter Manual traction and suboccipital release [facility] traction with pt positioned several times to get comfortable started with 10 lb pull, but able to increase to 15# static hold x 7 min then complained of intermittent shooting pain into head and was discontinued. Despite discontinuing pt liked the traction and would like to try it again.    11/27/2022  Cervical retraction x 10  Scapular retraction 2# 2x5 Pre-DN cervical rotation: right 42, left 60 degrees Post DN right rotation 42 degrees, left 62 R SCM stretch 2x30 sec Manual therapy: STM to right only upper traps, levator scap, periscapular muscles, cervical paraspinals and scalenes. Supine cervical traction; seated cervical traction with rotation x 5 B SNAGS into extension x 10 Skilled palpation and monitoring of soft tissues during DN. Trigger Point Dry-Needling  Treatment instructions: Expect mild to moderate muscle soreness. S/S of pneumothorax if dry needled over a lung field, and to seek immediate medical attention should they occur. Patient verbalized understanding of these instructions and education.  Patient Consent Given: Yes Education handout provided: Yes Muscles treated:  right only upper traps, levator scap, teres,  cervical multifidi, suboccipitals Electrical stimulation performed: No Parameters: N/A Treatment response/outcome: decreased tender point size and number, improved  soft tissue mobility   11/22/2022   Pre-DN cervical rotation: right 12, left 60 degrees Post DN right rotation 28 degrees, left 62 Review of HEP: cervical retractions (to lengthen suboccipitals), cervical rotation combined with a little flexion to lengthen levator scap  Manual therapy: right only upper traps, levator scap, periscapular muscles, cervical paraspinals Trigger Point Dry-Needling  Treatment instructions: Expect mild to moderate muscle soreness. S/S of pneumothorax if dry needled over a lung field, and to seek immediate medical attention should they occur. Patient verbalized understanding of these instructions and education.  Patient Consent Given: Yes Education handout provided: Yes Muscles treated:  right only upper traps, levator scap, teres,  cervical multifidi, suboccipitals Electrical stimulation performed: No Parameters: N/A Treatment response/outcome: decreased tender point size and number, improved soft tissue mobility                                                                               DATE:   11/20/2022 Scapular retraction and shoulder ext with yellow band x 5 ea with emphasis on posture Seated cervical retraction x 7 increased no worse Supine cervical retractions 2 x 5 produced shoulder blade no worse, no change in UE Shoulder presses x 5 Supine manual traction and suboccipital release; decrease UD pain with manual traction  STM to bilateral UT,  right pectorals, lateral trunk with cocoa butter PROM Right shoulder Flexion, scaption, abd, ER with multiple VC's to relax Supine bilateral AROM :shoulder flexion, scaption, and horizontal abduction MLD performed by therapist; Right supraclavicular LN's, 5 breaths, Right inguinal LN's, right axillo-inguinal pathway repeated multiple times for right inferior axillary swelling and ending with right inguinal  LN's.  11/15/2022 Pulleys x 2 min ea flexion and abd Bilateral cervical rotation and SB x 5 ea Wall arc  x 4 to the left and  right manual traction with head in several different angles ; mild change in hand, no effect on arm pain STM to bilateral UT,  right pectorals, lateral trunk with cocoa butter PROM Right shoulder Flexion, scaption, abd, ER with multiple VC's to relax MLD performed by therapist; Right supraclavicular LN's, 5 breaths, Right inguinal LN's, right axillo-inguinal pathway repeated multiple times for right inferior axillary swelling and ending with right inguinal  LN's. Had pt try same in sitting and using her left UE. She did well with practice;gave handout Discussed DN with pt; she would like to try  11/13/2022 STM to bilateral UT, pectorals, lateral trunk with cocoa butter Manual traction, suboccipital release PROM bilateral shoulders flex, abd, D2 flexion, IR and Erwith VC's to relax Supine AROM bilateral shoulder flexion, scaption, horizontal abd x 5     PATIENT EDUCATION:  Education details: Discussed POC including number of visits,  treatment interventions. Educated in supine AA shoulder flexion and supine stargazer, and sitting or standing scapular retraction to start gently at home to help Right shoulder ROM. Emphasized that pain should not be in shoulder joint Dry needling handout Person educated: Patient and Spouse Education method: Explanation and Handouts Education comprehension: verbalized understanding and returned demonstration  HOME EXERCISE PROGRAM: Post op exercises; AA shoulder flexion, stargazer, sitting scapular retraction Access Code: [email or web address] URL: [email or web address] Date: 11/27/2022 Prepared by: Raynelle Fanning  Exercises - Sternocleidomastoid Stretch  - 2 x daily - 7 x weekly - 1 sets - 3 reps - 30 hold  ASSESSMENT:  CLINICAL IMPRESSION: KNOX HOLDMAN continues to report 7/10 pain level in the R neck and upper traps area. She demonstrates close to equal cervical rotation today, but has pain on the R. She is still very tight in the R  SCM and pt encouraged to continue stretching here. Manual therapy revealed patient is more tender than painful in her muscles. She did still have trigger points in the R levator and B suboccipitals and responded well to TPDN and manual therapy. Second trial of mechanical traction was also performed with good tolerance. Patient is still interested in obtaining a home unit.    OBJECTIVE IMPAIRMENTS: decreased activity tolerance, decreased knowledge of condition, decreased ROM, decreased strength, increased edema, increased fascial restrictions, impaired sensation, impaired UE functional use, postural dysfunction, and pain.   ACTIVITY LIMITATIONS: carrying, lifting, standing, squatting, sleeping, stairs, reach over head, and hygiene/grooming  PARTICIPATION LIMITATIONS: cleaning, laundry, driving, shopping, and usual household chores  PERSONAL FACTORS: 1-2 comorbidities: Right breast Cancer s/p radiation  are also affecting patient's functional outcome.   REHAB POTENTIAL: Good  CLINICAL DECISION MAKING: Stable/uncomplicated  EVALUATION COMPLEXITY: Low  GOALS: Goals reviewed with patient? Yes  SHORT TERM GOALS: Target date: 11/13/2022  Pt will be independent in a HEP for ROM/strength of cervical and right UE regions Baseline: Goal status: MET, 11/29/2022  2.  Pt will have decreased overall pain by 25% Baseline:  Goal status: MET 11/29/2022 3.  Pt will improve Right  shoulder ROM by 20 degrees for shoulder abd, and 10 degrees for flexion for improved reaching Baseline:  Goal status: MET 11/07/2022 4.  Pt will have decreased c/o tingling in the right hand by 30% Baseline:  Goal status: MET 11/29/2022  5.  Quick dash will be improved by 20% to demonstrate improved function Baseline:  Goal status: INITIAL   LONG TERM GOALS: Target date: 12/04/2022  Pt will have decreased overall pain by 50% or greater Baseline:  Goal status: IN progress 2.  Pts quick dash will improve to no greater  than 20% to demonstrate improved function Baseline:  Goal status: INITIAL  3.  Pt will have right shoulder ROM Within 10 degrees of left for improved function Baseline:  Goal status: MET 4.  Pts cervical ROM will be [facility] without increased pain Baseline:  Goal status: In Progress  5.  Pt will be able to sleep with minimal disturbance from pain Baseline:  Goal status: In Progress 6.  Pt will noted improved ability to grasp objects, and write with right hand Baseline:  Goal status: In Progress  PLAN:  PT FREQUENCY: 2x/week  PT DURATION:4 weeks PLANNED INTERVENTIONS: Therapeutic exercises, Therapeutic activity, Neuromuscular re-education, Patient/Family education, Self Care, Orthotic/Fit training, Dry Needling, Moist heat, Manual lymph drainage, Manual therapy, Re-evaluation, and    PLAN FOR NEXT SESSION:  assess response to DN #3 and traction, conntinue [facility] home Trac,c, needs review of TB exs before given for HEP,Review MLD for right inferior axillary swelling,Postural education, STM bilateral UT, Right pectorals, lateral trunk,  , progress HEP as able and progress to strength when feeling better, median nerve glides  Solon Palm, PT  12/04/22 1:26 PM Phone: [phone] Fax: [phone]

## 2022-12-04 ENCOUNTER — Encounter: Payer: Self-pay | Admitting: Physical Therapy

## 2022-12-04 ENCOUNTER — Ambulatory Visit: Payer: Medicare Other | Attending: Pain Medicine | Admitting: Physical Therapy

## 2022-12-04 DIAGNOSIS — M542 Cervicalgia: Secondary | ICD-10-CM | POA: Insufficient documentation

## 2022-12-04 DIAGNOSIS — R293 Abnormal posture: Secondary | ICD-10-CM | POA: Insufficient documentation

## 2022-12-04 DIAGNOSIS — Z170 Estrogen receptor positive status [ER+]: Secondary | ICD-10-CM | POA: Diagnosis present

## 2022-12-04 DIAGNOSIS — R600 Localized edema: Secondary | ICD-10-CM | POA: Diagnosis present

## 2022-12-04 DIAGNOSIS — Z923 Personal history of irradiation: Secondary | ICD-10-CM | POA: Insufficient documentation

## 2022-12-04 DIAGNOSIS — M25611 Stiffness of right shoulder, not elsewhere classified: Secondary | ICD-10-CM | POA: Diagnosis present

## 2022-12-04 DIAGNOSIS — C50411 Malignant neoplasm of upper-outer quadrant of right female breast: Secondary | ICD-10-CM | POA: Insufficient documentation

## 2022-12-07 ENCOUNTER — Ambulatory Visit: Payer: Medicare Other

## 2022-12-07 DIAGNOSIS — M542 Cervicalgia: Secondary | ICD-10-CM | POA: Diagnosis not present

## 2022-12-07 DIAGNOSIS — R600 Localized edema: Secondary | ICD-10-CM

## 2022-12-07 DIAGNOSIS — C50411 Malignant neoplasm of upper-outer quadrant of right female breast: Secondary | ICD-10-CM

## 2022-12-07 DIAGNOSIS — R293 Abnormal posture: Secondary | ICD-10-CM

## 2022-12-07 DIAGNOSIS — M25611 Stiffness of right shoulder, not elsewhere classified: Secondary | ICD-10-CM

## 2022-12-07 DIAGNOSIS — Z923 Personal history of irradiation: Secondary | ICD-10-CM

## 2022-12-07 NOTE — Therapy (Addendum)
OUTPATIENT PHYSICAL THERAPY  UPPER EXTREMITY ONCOLOGY TREATMENT  Patient Name: Christie Fox MRN: [id number] DOB:1956/01/12, 67 y.o., female Today's Date: 12/07/2022  END OF SESSION:  PT End of Session - 12/07/22 0755     Visit Number 12    Number of Visits 18    Date for PT Re-Evaluation 12/27/22    Authorization Type [employer]    Authorization Time Period 12/05/2022-01/02/2023    Authorization - Visit Number 1    Authorization - Number of Visits 8    PT Start Time 0800    PT Stop Time 0850    PT Time Calculation (min) 50 min    Activity Tolerance Patient tolerated treatment well    Behavior During Therapy WFL for tasks assessed/performed               Past Medical History:  Diagnosis Date   Anxiety    Arthritis    knees   At risk for sleep apnea    STOP-BANG= 4    SENT TO PCP 07-31-2013   Borderline diabetes    diet controlled   Breast cancer (HCC)    Dysuria    History of kidney stones    Hyperlipidemia    Hypertension    Hypothyroidism    Migraines    PONV (postoperative nausea and vomiting)    "please call me Prestina to wake me up , not Mrs Clute"    Renal calculus, left    Urgency of urination    Past Surgical History:  Procedure Laterality Date   BREAST LUMPECTOMY WITH RADIOACTIVE SEED LOCALIZATION Right 02/27/2022   Procedure: RIGHT BREAST LUMPECTOMY WITH RADIOACTIVE SEED LOCALIZATION X3;  Surgeon: Emelia Loron, MD;  Location: [facility];  Service: General;  Laterality: Right;   CESAREAN SECTION  x2   CYSTOSCOPY WITH RETROGRADE PYELOGRAM, URETEROSCOPY AND STENT PLACEMENT Left 08/04/2013   Procedure: 1ST STAGE CYSTOSCOPY WITH RETROGRADE PYELOGRAM, URETEROSCOPY AND STENT PLACEMENT;  Surgeon: Sebastian Ache, MD;  Location: [facility];  Service: Urology;  Laterality: Left;   CYSTOSCOPY WITH RETROGRADE PYELOGRAM, URETEROSCOPY AND STENT PLACEMENT Left 08/20/2013   Procedure: 2ND STAGE CYSTOSCOPY WITH RETROGRADE PYELOGRAM, URETEROSCOPY BASKET STONES  AND STENT EXCHANGE, BLOOD MOP;  Surgeon: Sebastian Ache, MD;  Location: [facility];  Service: Urology;  Laterality: Left;   EXPLORATORY LAPAROTOMY W/ BILATERAL SALPINOOPHORECTOMY  11-27-2001   HOLMIUM LASER APPLICATION Left 08/04/2013   Procedure: HOLMIUM LASER APPLICATION;  Surgeon: Sebastian Ache, MD;  Location: [facility];  Service: Urology;  Laterality: Left;   LAPAROSCOPIC GASTRIC SLEEVE RESECTION N/A 03/27/2017   Procedure: LAPAROSCOPIC GASTRIC SLEEVE RESECTION WITH UPPER ENDO;  Surgeon: Ovidio Kin, MD;  Location: [facility];  Service: General;  Laterality: N/A;   LEFT URETEROSCOPIC LASER LITHO STONE EXTRACTION AND STENT PLACEMENT  03-25-2005   LUMBAR FUSION  X2   LAST ONE 2000   ORIF LEFT ANKLE FX  03-20-2000   RETAINED HARDWARE   RADIOACTIVE SEED GUIDED EXCISIONAL BREAST BIOPSY Left 02/27/2022   Procedure: RADIOACTIVE SEED GUIDED EXCISIONAL LEFT BREAST BIOPSY;  Surgeon: Emelia Loron, MD;  Location: [facility];  Service: General;  Laterality: Left;   VAGINAL HYSTERECTOMY  1999   WISDOM TOOTH EXTRACTION     Patient Active Problem List   Diagnosis Date Noted   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) 12/30/2021   Hyperlipidemia 08/19/2014   Hypertension 08/19/2014   Hypothyroidism 08/19/2014   Primary osteoarthritis of both knees 08/19/2014    PCP:  Brett Fairy, MD  REFERRING PROVIDER: Mancel Parsons, MD  REFERRING DIAG: Cervical and Right UE pain s/p Right breast Cancer with radiation  THERAPY DIAG:  Cervicalgia  Stiffness of right shoulder, not elsewhere classified  Abnormal posture  Status post radiation therapy  Malignant neoplasm of upper-outer quadrant of right breast in female, estrogen receptor positive (HCC)  Localized edema  ONSET DATE: 05/2022  Rationale for Evaluation and Treatment: Rehabilitation  SUBJECTIVE:                                                                                                                                                                                            SUBJECTIVE STATEMENT:  I had my last DN. It was very helpful and I think I got some lasting benefit from it. I feel better today than I have in months.  The traction really helps.I can turn my head better. My arm and hand is better. It is just the tip of my middle finger that tingles a little now. I would like to have you see if my insurance will cover the traction. I am very aware of my posture when I get up now. Pt has reconstruction surgery on 12/25/2022  EVAL Pts noticed pain in her shoulders approximately a month after radiation to the right axillary region post Right breast Cancer Surgery, and then started having throbbing pain in right UE, with tingling in right median nerve distribution. She feels a lot of tightness in the axillary region and notes swelling at the lateral trunk. She doesn't notice swelling in the right breast but the nipple gets chaffed. By 4:00 her bra has to come off. Pt has pain in bilateral UT and neck and difficulty turning. She feels weakness in her hand and has difficulty grasping things to write and carry.  PERTINENT HISTORY:  Pt with pain involving her neck and right shoulder, right arm, and right hand. Patient has a history significant for breast cancer with bilateral Lumpectomies; Right for DCIS, left with a Complex Sclerosing Lesion (Benign) and 0/1 incidental nodes. She had Adjuvant radiation on the right ending on 05/08/2022. Neck pain started 1-3 months after radiation. It is sharp, stabbing, aching and constant. Noted to have Diffuse DDD, Trace anterolisthesis at C3-C4 and C4-C5, and 2-3 mm anterolisthesis at C7-T1. Trace retrolisthesis at C6-C7,. Multilevel degenerative disc changes with disc space height loss most pronounced and moderate to severe at C5-C6 and C6-C7,Multilevel uncovertebral spurring and facet arthropathy throughout cervical spine.  PAIN:  Are you having  pain? Yes NPRS scale: 4/10  Pain location:spine of neck Pain orientation: Right  PAIN TYPE: throbbing if moving too  fast, sometimes sharp Pain description:intermittent Aggravating factors: disturbed sleep, household chores,reaching with right UE,  Relieving factors: muscle relaxers some help, cervical traction  PRECAUTIONS: Prior foot ,ankle (rods and pins) Left LE swelling,, back surgery, gastric sleeve, hysterectomy, pain in bilateral knees  RED FLAGS: Bowel or bladder incontinence: Yes:      WEIGHT BEARING RESTRICTIONS: No  FALLS:  Has patient fallen in last 6 months? 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[place]  HAND DOMINANCE: right   PRIOR LEVEL OF FUNCTION: Independent  PATIENT GOALS: Be able to function better   OBJECTIVE:  COGNITION: Overall cognitive status: Within functional limits for tasks assessed   PALPATION: Very tender bilateral UT, cervical region, Bilateral pectorals and scapula greatest on the right and  right lateral trunk, medial arm, forearm  OBSERVATIONS / OTHER ASSESSMENTS: bilateral scapular protraction, swelling observed inferior to Right axilla at lateral trunk  SENSATION: Light touch:   POSTURE: forward head, rounded shoulders  UPPER EXTREMITY AROM/PROM:  A/PROM RIGHT   eval  RIGHT 11/07/2022 RIGHT 11/29/2022  Shoulder extension 29, pain anterior shoulder 50 46 pain in shoulder blade  Shoulder flexion 127 pain under arm and upper arm 152 152  Shoulder abduction 108 posterior arm 155 154 pain at right lateral trunk  Shoulder internal rotation 60    Shoulder external rotation 83 shoulder joint      (Blank rows = not tested)  A/PROM LEFT   eval  Shoulder extension 33  Shoulder flexion 147  Shoulder abduction 143  Shoulder internal rotation 64  Shoulder external rotation 90    (Blank rows = not tested)  CERVICAL  AROM: slight right All within normal limits:    Percent limited 11/29/2022  Flexion WNL pain  post neck and bilateral UT WdNL no pain  Extension Dec 20%, pain in R shoulder blade Dec 20% crepitus  Right lateral flexion Dec 15%, pain right UT/shoulderblade Decreased 60% crepitus/pain  Left lateral flexion WNL,Tight Right UT, pain left UT Decreased 40%, pain  Right rotation Decreased 50%,, pain right neck, SB  Decreased 40% tight, pain into neck  Left rotation Decreased 20% bilateral neck, SB Decreased 10%    UPPER EXTREMITY STRENGTH:   LYMPHEDEMA ASSESSMENTS:   SURGERY TYPE/DATE: Bilateral breast lumpectomies;  NUMBER OF LYMPH NODES REMOVED: Left 0/1 incidental LN Right 0 LN's  CHEMOTHERAPY: No  RADIATION:Adjuvant ended 05/08/2022  HORMONE TREATMENT: recommended to take Anastrozole  INFECTIONS: NO   LYMPHEDEMA ASSESSMENTS:   LANDMARK RIGHT  eval  At axilla  35.1  15 cm proximal to olecranon process 33.7  10 cm proximal to olecranon process 32.6  Olecranon process 27.3  15 cm proximal to ulnar styloid process   10 cm proximal to ulnar styloid process 22.6  Just proximal to ulnar styloid process 16.8  Across hand at thumb web space 18.9  At base of 2nd digit 6.3  (Blank rows = not tested)  LANDMARK LEFT  eval  At axilla  34.8  15 cm proximal to olecranon process 33.0  10 cm proximal to olecranon process 29.8  Olecranon process 26.0  15 cm proximal to ulnar styloid process   10 cm proximal to ulnar styloid process 21.5  Just proximal to ulnar styloid process 16.4  Across hand at thumb web space 18.3  At base of 2nd digit 6.05  (Blank rows = not tested)   FUNCTIONAL TESTS: NT; has knee pain  QUICK DASH SURVEY: 70%   TODAY'S TREATMENT:        DATE:  12/07/2022 Bilateral rotation and SB x 5 Cervical retractions x 10 STM to bilateral UT, levator, upper scapular region and posterior cervicals AROM bilateral shoulder flexion, scaption, horizontal  abduction x 5 Saunders Cervical Traction 3rd level, 4 min at 13#static, 7 min at 15# static     12/04/22 Seated nerve glide in prayer position x 10 Standing radial nerve glide with head tilt x 10 R SCM stretch 2 x 30 sec R Manual: Skilled palpation and monitoring of soft tissues during DN  Trigger Point Dry-Needling  Treatment instructions: Expect mild to moderate muscle soreness. S/S of pneumothorax if dry needled over a lung field, and to seek immediate medical attention should they occur. Patient verbalized understanding of these instructions and education.  Patient Consent Given: Yes Education handout provided: Yes Muscles treated:  R levator scap and B suboccipitals Electrical stimulation performed: No Parameters: N/A Treatment response/outcome: decreased tissue tension  STM to bilateral UT, levator, upper scapular region and posterior cervicals  Saunders Home traction 13-15 lb pull static hold x  10 min  11/29/2022 Reviewed goals and assessed shoulder/neck ROM Cervical rotation x 5 B STW bilateral UT, levator, upper scapular region and posterior cervicals with cocoa butter Manual traction and suboccipital release Saunders Home traction with pt positioned several times to get comfortable started with 10 lb pull, but able to increase to 15# static hold x 7 min then complained of intermittent shooting pain into head and was discontinued. Despite discontinuing pt liked the traction and would like to try it again.    11/27/2022  Cervical retraction x 10  Scapular retraction 2# 2x5 Pre-DN cervical rotation: right 42, left 60 degrees Post DN right rotation 42 degrees, left 62 R SCM stretch 2x30 sec Manual therapy: STM to right only upper traps, levator scap, periscapular muscles, cervical paraspinals and scalenes. Supine cervical traction; seated cervical traction with rotation x 5 B SNAGS into extension x 10 Skilled palpation and monitoring of soft tissues during DN. Trigger  Point Dry-Needling  Treatment instructions: Expect mild to moderate muscle soreness. S/S of pneumothorax if dry needled over a lung field, and to seek immediate medical attention should they occur. Patient verbalized understanding of these instructions and education.  Patient Consent Given: Yes Education handout provided: Yes Muscles treated:  right only upper traps, levator scap, teres,  cervical multifidi, suboccipitals Electrical stimulation performed: No Parameters: N/A Treatment response/outcome: decreased tender point size and number, improved soft tissue mobility   11/22/2022   Pre-DN cervical rotation: right 12, left 60 degrees Post DN right rotation 28 degrees, left 62 Review of HEP: cervical retractions (to lengthen suboccipitals), cervical rotation combined with a little flexion to lengthen levator scap  Manual therapy: right only upper traps, levator scap, periscapular muscles, cervical paraspinals Trigger Point Dry-Needling  Treatment instructions: Expect mild to moderate muscle soreness. S/S of pneumothorax if dry needled over a lung field, and to seek immediate medical attention should they occur. Patient verbalized understanding of these instructions and education.  Patient Consent Given: Yes Education handout provided: Yes Muscles treated:  right only upper traps, levator scap, teres,  cervical multifidi, suboccipitals Electrical stimulation performed: No Parameters: N/A Treatment response/outcome: decreased tender point size and number, improved soft tissue mobility  11/20/2022 Scapular retraction and shoulder ext with yellow band x 5 ea with emphasis on posture Seated cervical retraction x 7 increased no worse Supine cervical retractions 2 x 5 produced shoulder blade no worse, no change in UE Shoulder presses x 5 Supine manual traction and suboccipital release; decrease UD pain with  manual traction STM to bilateral UT,  right pectorals, lateral trunk with cocoa butter PROM Right shoulder Flexion, scaption, abd, ER with multiple VC's to relax Supine bilateral AROM :shoulder flexion, scaption, and horizontal abduction MLD performed by therapist; Right supraclavicular LN's, 5 breaths, Right inguinal LN's, right axillo-inguinal pathway repeated multiple times for right inferior axillary swelling and ending with right inguinal  LN's.  11/15/2022 Pulleys x 2 min ea flexion and abd Bilateral cervical rotation and SB x 5 ea Wall arc x 4 to the left and  right manual traction with head in several different angles ; mild change in hand, no effect on arm pain STM to bilateral UT,  right pectorals, lateral trunk with cocoa butter PROM Right shoulder Flexion, scaption, abd, ER with multiple VC's to relax MLD performed by therapist; Right supraclavicular LN's, 5 breaths, Right inguinal LN's, right axillo-inguinal pathway repeated multiple times for right inferior axillary swelling and ending with right inguinal  LN's. Had pt try same in sitting and using her left UE. She did well with practice;gave handout Discussed DN with pt; she would like to try  11/13/2022 STM to bilateral UT, pectorals, lateral trunk with cocoa butter Manual traction, suboccipital release PROM bilateral shoulders flex, abd, D2 flexion, IR and Erwith VC's to relax Supine AROM bilateral shoulder flexion, scaption, horizontal abd x 5     PATIENT EDUCATION:  Education details: Discussed POC including number of visits,  treatment interventions. Educated in supine AA shoulder flexion and supine stargazer, and sitting or standing scapular retraction to start gently at home to help Right shoulder ROM. Emphasized that pain should not be in shoulder joint Dry needling handout Person educated: Patient and Spouse Education method: Explanation and Handouts Education comprehension: verbalized understanding and returned  demonstration  HOME EXERCISE PROGRAM: Post op exercises; AA shoulder flexion, stargazer, sitting scapular retraction Access Code: [email or web address] URL: [email or web address] Date: 11/27/2022 Prepared by: Raynelle Fanning  Exercises - Sternocleidomastoid Stretch  - 2 x daily - 7 x weekly - 1 sets - 3 reps - 30 hold  ASSESSMENT:  CLINICAL IMPRESSION:  Pt has had excellent benefit from traction and DN. Arm and hand pain has resolved except for mild tingling in the tip of her middle finger . Her cervical ROM is also greatly improved. Pt gave permission for information to be sent to DME company regarding home traction OBJECTIVE IMPAIRMENTS: decreased activity tolerance, decreased knowledge of condition, decreased ROM, decreased strength, increased edema, increased fascial restrictions, impaired sensation, impaired UE functional use, postural dysfunction, and pain.   ACTIVITY LIMITATIONS: carrying, lifting, standing, squatting, sleeping, stairs, reach over head, and hygiene/grooming  PARTICIPATION LIMITATIONS: cleaning, laundry, driving, shopping, and usual household chores  PERSONAL FACTORS: 1-2 comorbidities: Right breast Cancer s/p radiation  are also affecting patient's functional outcome.   REHAB POTENTIAL: Good  CLINICAL DECISION MAKING: Stable/uncomplicated  EVALUATION COMPLEXITY: Low  GOALS: Goals reviewed with patient? Yes  SHORT TERM GOALS: Target date: 11/13/2022  Pt will be independent in a HEP for ROM/strength of cervical and right UE regions Baseline: Goal status: MET, 11/29/2022  2.  Pt will have decreased overall pain by 25% Baseline:  Goal status: MET 11/29/2022 3.  Pt will improve  Right shoulder ROM by 20 degrees for shoulder abd, and 10 degrees for flexion for improved reaching Baseline:  Goal status: MET 11/07/2022 4.  Pt will have decreased c/o tingling in the right hand by 30% Baseline:  Goal status: MET 11/29/2022  5.  Quick dash will be improved by 20% to  demonstrate improved function Baseline:  Goal status: INITIAL   LONG TERM GOALS: Target date: 12/04/2022  Pt will have decreased overall pain by 50% or greater Baseline:  Goal status: MET,  12/07/2022 2.  Pts quick dash will improve to no greater than 20% to demonstrate improved function Baseline:  Goal status: INITIAL  3.  Pt will have right shoulder ROM Within 10 degrees of left for improved function Baseline:  Goal status: MET 4.  Pts cervical ROM will be [facility] without increased pain Baseline:  Goal status: In Progress  5.  Pt will be able to sleep with minimal disturbance from pain Baseline:  Goal status: In progress 6.  Pt will noted improved ability to grasp objects, and write with right hand Baseline:  Goal status: MET 12/06/2022 PLAN:  PT FREQUENCY: 2x/week  PT DURATION:4 weeks PLANNED INTERVENTIONS: Therapeutic exercises, Therapeutic activity, Neuromuscular re-education, Patient/Family education, Self Care, Orthotic/Fit training, Dry Needling, Moist heat, Manual lymph drainage, Manual therapy, Re-evaluation, and    PLAN FOR NEXT SESSION:  assess response to DN #3 and traction, conntinue Saunders home Trac,c, needs review of TB exs before given for HEP,Review MLD for right inferior axillary swelling,Postural education, STM bilateral UT, Right pectorals, lateral trunk,  , progress HEP as able and progress to strength when feeling better, median nerve glides  Alvira Monday, PT  12/07/22 8:52 AM Phone: [phone] Fax: [phone]

## 2022-12-11 ENCOUNTER — Ambulatory Visit: Payer: Medicare Other

## 2022-12-11 DIAGNOSIS — C50411 Malignant neoplasm of upper-outer quadrant of right female breast: Secondary | ICD-10-CM

## 2022-12-11 DIAGNOSIS — R293 Abnormal posture: Secondary | ICD-10-CM

## 2022-12-11 DIAGNOSIS — R600 Localized edema: Secondary | ICD-10-CM

## 2022-12-11 DIAGNOSIS — Z923 Personal history of irradiation: Secondary | ICD-10-CM

## 2022-12-11 DIAGNOSIS — M542 Cervicalgia: Secondary | ICD-10-CM | POA: Diagnosis not present

## 2022-12-11 DIAGNOSIS — M25611 Stiffness of right shoulder, not elsewhere classified: Secondary | ICD-10-CM

## 2022-12-11 NOTE — Therapy (Signed)
OUTPATIENT PHYSICAL THERAPY  UPPER EXTREMITY ONCOLOGY TREATMENT  Patient Name: Christie Fox MRN: [id number] DOB:[birth year], 67 y.o., female Today's Date: 12/11/2022  END OF SESSION:  PT End of Session - 12/11/22 1106     Visit Number 13    Number of Visits 18    Date for PT Re-Evaluation 12/27/22    Authorization Type [employer]    Authorization Time Period 12/05/2022-01/02/2023    Authorization - Visit Number 2    Authorization - Number of Visits 8    PT Start Time 1105    PT Stop Time 1155    PT Time Calculation (min) 50 min    Activity Tolerance Patient tolerated treatment well    Behavior During Therapy WFL for tasks assessed/performed               Past Medical History:  Diagnosis Date   Anxiety    Arthritis    knees   At risk for sleep apnea    STOP-BANG= 4    SENT TO PCP 07-31-2013   Borderline diabetes    diet controlled   Breast cancer (HCC)    Dysuria    History of kidney stones    Hyperlipidemia    Hypertension    Hypothyroidism    Migraines    PONV (postoperative nausea and vomiting)    "please call me Larysa to wake me up , not Mrs Garfield"    Renal calculus, left    Urgency of urination    Past Surgical History:  Procedure Laterality Date   BREAST LUMPECTOMY WITH RADIOACTIVE SEED LOCALIZATION Right 02/27/2022   Procedure: RIGHT BREAST LUMPECTOMY WITH RADIOACTIVE SEED LOCALIZATION X3;  Surgeon: Emelia Loron, MD;  Location: [facility];  Service: General;  Laterality: Right;   CESAREAN SECTION  x2   CYSTOSCOPY WITH RETROGRADE PYELOGRAM, URETEROSCOPY AND STENT PLACEMENT Left 08/04/2013   Procedure: 1ST STAGE CYSTOSCOPY WITH RETROGRADE PYELOGRAM, URETEROSCOPY AND STENT PLACEMENT;  Surgeon: Sebastian Ache, MD;  Location: [facility];  Service: Urology;  Laterality: Left;   CYSTOSCOPY WITH RETROGRADE PYELOGRAM, URETEROSCOPY AND STENT PLACEMENT Left 08/20/2013   Procedure: 2ND STAGE CYSTOSCOPY WITH RETROGRADE PYELOGRAM, URETEROSCOPY BASKET  STONES AND STENT EXCHANGE, BLOOD MOP;  Surgeon: Sebastian Ache, MD;  Location: [facility];  Service: Urology;  Laterality: Left;   EXPLORATORY LAPAROTOMY W/ BILATERAL SALPINOOPHORECTOMY  11-27-2001   HOLMIUM LASER APPLICATION Left 08/04/2013   Procedure: HOLMIUM LASER APPLICATION;  Surgeon: Sebastian Ache, MD;  Location: [facility];  Service: Urology;  Laterality: Left;   LAPAROSCOPIC GASTRIC SLEEVE RESECTION N/A 03/27/2017   Procedure: LAPAROSCOPIC GASTRIC SLEEVE RESECTION WITH UPPER ENDO;  Surgeon: Ovidio Kin, MD;  Location: [facility];  Service: General;  Laterality: N/A;   LEFT URETEROSCOPIC LASER LITHO STONE EXTRACTION AND STENT PLACEMENT  03-25-2005   LUMBAR FUSION  X2   LAST ONE 2000   ORIF LEFT ANKLE FX  03-20-2000   RETAINED HARDWARE   RADIOACTIVE SEED GUIDED EXCISIONAL BREAST BIOPSY Left 02/27/2022   Procedure: RADIOACTIVE SEED GUIDED EXCISIONAL LEFT BREAST BIOPSY;  Surgeon: Emelia Loron, MD;  Location: [facility];  Service: General;  Laterality: Left;   VAGINAL HYSTERECTOMY  1999   WISDOM TOOTH EXTRACTION     Patient Active Problem List   Diagnosis Date Noted   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) 12/30/2021   Hyperlipidemia 08/19/2014   Hypertension 08/19/2014   Hypothyroidism 08/19/2014   Primary osteoarthritis of both knees 08/19/2014    PCP:  Brett Fairy, MD  REFERRING PROVIDER: Mancel Parsons, MD  REFERRING DIAG: Cervical and Right UE pain s/p Right breast Cancer with radiation  THERAPY DIAG:  Cervicalgia  Stiffness of right shoulder, not elsewhere classified  Abnormal posture  Status post radiation therapy  Malignant neoplasm of upper-outer quadrant of right breast in female, estrogen receptor positive (HCC)  Localized edema  ONSET DATE: 05/2022  Rationale for Evaluation and Treatment: Rehabilitation  SUBJECTIVE:                                                                                                                                                                                            SUBJECTIVE STATEMENT:  I do well for a couple of days  but then my neck and shoulder blade on the right gets tight. The 2-4 fingers still hurt , but if I breathe and relax everything seems to improve and I feel the circulation come back in my hand. EVAL Pts noticed pain in her shoulders approximately a month after radiation to the right axillary region post Right breast Cancer Surgery, and then started having throbbing pain in right UE, with tingling in right median nerve distribution. She feels a lot of tightness in the axillary region and notes swelling at the lateral trunk. She doesn't notice swelling in the right breast but the nipple gets chaffed. By 4:00 her bra has to come off. Pt has pain in bilateral UT and neck and difficulty turning. She feels weakness in her hand and has difficulty grasping things to write and carry.  PERTINENT HISTORY:  Pt with pain involving her neck and right shoulder, right arm, and right hand. Patient has a history significant for breast cancer with bilateral Lumpectomies; Right for DCIS, left with a Complex Sclerosing Lesion (Benign) and 0/1 incidental nodes. She had Adjuvant radiation on the right ending on 05/08/2022. Neck pain started 1-3 months after radiation. It is sharp, stabbing, aching and constant. Noted to have Diffuse DDD, Trace anterolisthesis at C3-C4 and C4-C5, and 2-3 mm anterolisthesis at C7-T1. Trace retrolisthesis at C6-C7,. Multilevel degenerative disc changes with disc space height loss most pronounced and moderate to severe at C5-C6 and C6-C7,Multilevel uncovertebral spurring and facet arthropathy throughout cervical spine.  PAIN:  Are you having pain? Yes NPRS scale:4- 5/10  Pain location:UT and right scapular area Pain orientation: Right  PAIN TYPE: throbbing if moving too fast, sometimes sharp Pain description:intermittent Aggravating factors:  disturbed sleep, household chores,reaching with right UE,  Relieving factors: muscle relaxers some help, cervical traction  PRECAUTIONS: Prior foot ,ankle (rods and pins) Left LE swelling,, back surgery, gastric sleeve, hysterectomy, pain in bilateral  knees  RED FLAGS: Bowel or bladder incontinence: Yes:      WEIGHT BEARING RESTRICTIONS: No  FALLS:  Has patient fallen in last 6 months? 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[place]  HAND DOMINANCE: right   PRIOR LEVEL OF FUNCTION: Independent  PATIENT GOALS: Be able to function better   OBJECTIVE:  COGNITION: Overall cognitive status: Within functional limits for tasks assessed   PALPATION: Very tender bilateral UT, cervical region, Bilateral pectorals and scapula greatest on the right and  right lateral trunk, medial arm, forearm  OBSERVATIONS / OTHER ASSESSMENTS: bilateral scapular protraction, swelling observed inferior to Right axilla at lateral trunk  SENSATION: Light touch:   POSTURE: forward head, rounded shoulders  UPPER EXTREMITY AROM/PROM:  A/PROM RIGHT   eval  RIGHT 11/07/2022 RIGHT 11/29/2022  Shoulder extension 29, pain anterior shoulder 50 46 pain in shoulder blade  Shoulder flexion 127 pain under arm and upper arm 152 152  Shoulder abduction 108 posterior arm 155 154 pain at right lateral trunk  Shoulder internal rotation 60    Shoulder external rotation 83 shoulder joint      (Blank rows = not tested)  A/PROM LEFT   eval  Shoulder extension 33  Shoulder flexion 147  Shoulder abduction 143  Shoulder internal rotation 64  Shoulder external rotation 90    (Blank rows = not tested)  CERVICAL AROM: slight right All within normal limits:    Percent limited 11/29/2022  Flexion WNL pain  post neck and bilateral UT WdNL no pain  Extension Dec 20%, pain in R shoulder blade Dec 20%  crepitus  Right lateral flexion Dec 15%, pain right UT/shoulderblade Decreased 60% crepitus/pain  Left lateral flexion WNL,Tight Right UT, pain left UT Decreased 40%, pain  Right rotation Decreased 50%,, pain right neck, SB  Decreased 40% tight, pain into neck  Left rotation Decreased 20% bilateral neck, SB Decreased 10%    UPPER EXTREMITY STRENGTH:   LYMPHEDEMA ASSESSMENTS:   SURGERY TYPE/DATE: Bilateral breast lumpectomies;  NUMBER OF LYMPH NODES REMOVED: Left 0/1 incidental LN Right 0 LN's  CHEMOTHERAPY: No  RADIATION:Adjuvant ended 05/08/2022  HORMONE TREATMENT: recommended to take Anastrozole  INFECTIONS: NO   LYMPHEDEMA ASSESSMENTS:   LANDMARK RIGHT  eval  At axilla  35.1  15 cm proximal to olecranon process 33.7  10 cm proximal to olecranon process 32.6  Olecranon process 27.3  15 cm proximal to ulnar styloid process   10 cm proximal to ulnar styloid process 22.6  Just proximal to ulnar styloid process 16.8  Across hand at thumb web space 18.9  At base of 2nd digit 6.3  (Blank rows = not tested)  LANDMARK LEFT  eval  At axilla  34.8  15 cm proximal to olecranon process 33.0  10 cm proximal to olecranon process 29.8  Olecranon process 26.0  15 cm proximal to ulnar styloid process   10 cm proximal to ulnar styloid process 21.5  Just proximal to ulnar styloid process 16.4  Across hand at thumb web space 18.3  At base of 2nd digit 6.05  (Blank rows = not tested)   FUNCTIONAL TESTS: NT; has knee pain    QUICK DASH SURVEY: 70%   TODAY'S TREATMENT:        DATE: 12/11/2022 STM to bilateral UT, levator, upper scapular region and posterior cervicals Manual traction, suboccipital release Supine scapular series x 5 ea except  horizontal abd x 10 with yellow and updated HEP and gave pt yellow band Saunders Cervical Traction 3rd level: 15 # x 12 min static  12/07/2022 Bilateral rotation and SB x 5 Cervical retractions x 10 STM to bilateral UT, levator,  upper scapular region and posterior cervicals AROM bilateral shoulder flexion, scaption, horizontal abduction x 5 Saunders Cervical Traction 3rd level, 4 min at 13#static, 7 min at 15# static  Pt has had excellent benefit from traction and DN. Arm and hand pain has resolved except for mild tingling in the tip of her middle finger . Her cervical ROM is also greatly improved. Pt gave permission for information to be sent to DME company regarding home traction   12/04/22 Seated nerve glide in prayer position x 10 Standing radial nerve glide with head tilt x 10 R SCM stretch 2 x 30 sec R Manual: Skilled palpation and monitoring of soft tissues during DN  Trigger Point Dry-Needling  Treatment instructions: Expect mild to moderate muscle soreness. S/S of pneumothorax if dry needled over a lung field, and to seek immediate medical attention should they occur. Patient verbalized understanding of these instructions and education.  Patient Consent Given: Yes Education handout provided: Yes Muscles treated:  R levator scap and B suboccipitals Electrical stimulation performed: No Parameters: N/A Treatment response/outcome: decreased tissue tension  STM to bilateral UT, levator, upper scapular region and posterior cervicals  Saunders Home traction 13-15 lb pull static hold x  10 min  11/29/2022 Reviewed goals and assessed shoulder/neck ROM Cervical rotation x 5 B STW bilateral UT, levator, upper scapular region and posterior cervicals with cocoa butter Manual traction and suboccipital release Saunders Home traction with pt positioned several times to get comfortable started with 10 lb pull, but able to increase to 15# static hold x 7 min then complained of intermittent shooting pain into head and was discontinued. Despite discontinuing pt liked the traction and would like to try it again.    11/27/2022  Cervical retraction x 10  Scapular retraction 2# 2x5 Pre-DN cervical rotation: right 42,  left 60 degrees Post DN right rotation 42 degrees, left 62 R SCM stretch 2x30 sec Manual therapy: STM to right only upper traps, levator scap, periscapular muscles, cervical paraspinals and scalenes. Supine cervical traction; seated cervical traction with rotation x 5 B SNAGS into extension x 10 Skilled palpation and monitoring of soft tissues during DN. Trigger Point Dry-Needling  Treatment instructions: Expect mild to moderate muscle soreness. S/S of pneumothorax if dry needled over a lung field, and to seek immediate medical attention should they occur. Patient verbalized understanding of these instructions and education.  Patient Consent Given: Yes Education handout provided: Yes Muscles treated:  right only upper traps, levator scap, teres,  cervical multifidi, suboccipitals Electrical stimulation performed: No Parameters: N/A Treatment response/outcome: decreased tender point size and number, improved soft tissue mobility   11/22/2022   Pre-DN cervical rotation: right 12, left 60 degrees Post DN right rotation 28 degrees, left 62 Review of HEP: cervical retractions (to lengthen suboccipitals), cervical rotation combined with a little flexion to lengthen levator scap  Manual therapy: right only upper traps, levator scap, periscapular muscles, cervical paraspinals Trigger Point Dry-Needling  Treatment instructions: Expect mild to moderate muscle soreness. S/S of pneumothorax if dry needled over a lung field, and to seek immediate medical attention should they occur. Patient verbalized understanding of these instructions and education.  Patient Consent Given: Yes Education handout provided: Yes Muscles treated:  right only upper traps,  levator scap, teres,  cervical multifidi, suboccipitals Electrical stimulation performed: No Parameters: N/A Treatment response/outcome: decreased tender point size and number, improved soft tissue mobility                                                                                DATE: Pt has had excellent benefit from traction and DN. Arm and hand pain has resolved except for mild tingling in the tip of her middle finger . Her cervical ROM is also greatly improved. Pt gave permission for information to be sent to DME company regarding home traction   11/20/2022 Scapular retraction and shoulder ext with yellow band x 5 ea with emphasis on posture Seated cervical retraction x 7 increased no worse Supine cervical retractions 2 x 5 produced shoulder blade no worse, no change in UE Shoulder presses x 5 Supine manual traction and suboccipital release; decrease UD pain with manual traction STM to bilateral UT,  right pectorals, lateral trunk with cocoa butter PROM Right shoulder Flexion, scaption, abd, ER with multiple VC's to relax Supine bilateral AROM :shoulder flexion, scaption, and horizontal abduction MLD performed by therapist; Right supraclavicular LN's, 5 breaths, Right inguinal LN's, right axillo-inguinal pathway repeated multiple times for right inferior axillary swelling and ending with right inguinal  LN's.  11/15/2022 Pulleys x 2 min ea flexion and abd Bilateral cervical rotation and SB x 5 ea Wall arc x 4 to the left and  right manual traction with head in several different angles ; mild change in hand, no effect on arm pain STM to bilateral UT,  right pectorals, lateral trunk with cocoa butter PROM Right shoulder Flexion, scaption, abd, ER with multiple VC's to relax MLD performed by therapist; Right supraclavicular LN's, 5 breaths, Right inguinal LN's, right axillo-inguinal pathway repeated multiple times for right inferior axillary swelling and ending with right inguinal  LN's. Had pt try same in sitting and using her left UE. She did well with practice;gave handout Discussed DN with pt; she would like to try  11/13/2022 STM to bilateral UT, pectorals, lateral trunk with cocoa butter Manual traction, suboccipital  release PROM bilateral shoulders flex, abd, D2 flexion, IR and Erwith VC's to relax Supine AROM bilateral shoulder flexion, scaption, horizontal abd x 5     PATIENT EDUCATION:  Education details: Discussed POC including number of visits,  treatment interventions. Educated in supine AA shoulder flexion and supine stargazer, and sitting or standing scapular retraction to start gently at home to help Right shoulder ROM. Emphasized that pain should not be in shoulder joint Dry needling handout Person educated: Patient and Spouse Education method: Explanation and Handouts Education comprehension: verbalized understanding and returned demonstration  HOME EXERCISE PROGRAM  Supine scapular stabs: yellow x 5 Post op exercises; AA shoulder flexion, stargazer, sitting scapular retraction Access Code: [email or web address] URL: [email or web address] Date: 11/27/2022 Prepared by: Raynelle Fanning  Exercises - Sternocleidomastoid Stretch  - 2 x daily - 7 x weekly - 1 sets - 3 reps - 30 hold  ASSESSMENT:  CLINICAL IMPRESSION:  Continued manual work, cervical traction and initiated supine scapular series exercises. Pt continues to get good response with cervical traction, and continues to have a trigger point in right  levator area today. She is learning how to relax better which is also helping her pain  OBJECTIVE IMPAIRMENTS: decreased activity tolerance, decreased knowledge of condition, decreased ROM, decreased strength, increased edema, increased fascial restrictions, impaired sensation, impaired UE functional use, postural dysfunction, and pain.   ACTIVITY LIMITATIONS: carrying, lifting, standing, squatting, sleeping, stairs, reach over head, and hygiene/grooming  PARTICIPATION LIMITATIONS: cleaning, laundry, driving, shopping, and usual household chores  PERSONAL FACTORS: 1-2 comorbidities: Right breast Cancer s/p radiation  are also affecting patient's functional outcome.   REHAB POTENTIAL:  Good  CLINICAL DECISION MAKING: Stable/uncomplicated  EVALUATION COMPLEXITY: Low  GOALS: Goals reviewed with patient? Yes  SHORT TERM GOALS: Target date: 11/13/2022  Pt will be independent in a HEP for ROM/strength of cervical and right UE regions Baseline: Goal status: MET, 11/29/2022  2.  Pt will have decreased overall pain by 25% Baseline:  Goal status: MET 11/29/2022 3.  Pt will improve Right shoulder ROM by 20 degrees for shoulder abd, and 10 degrees for flexion for improved reaching Baseline:  Goal status: MET 11/07/2022 4.  Pt will have decreased c/o tingling in the right hand by 30% Baseline:  Goal status: MET 11/29/2022  5.  Quick dash will be improved by 20% to demonstrate improved function Baseline:  Goal status: INITIAL   LONG TERM GOALS: Target date: 12/04/2022  Pt will have decreased overall pain by 50% or greater Baseline:  Goal status: MET,  12/07/2022 2.  Pts quick dash will improve to no greater than 20% to demonstrate improved function Baseline:  Goal status: INITIAL  3.  Pt will have right shoulder ROM Within 10 degrees of left for improved function Baseline:  Goal status: MET 4.  Pts cervical ROM will be [facility] without increased pain Baseline:  Goal status: In Progress  5.  Pt will be able to sleep with minimal disturbance from pain Baseline:  Goal status: In progress 6.  Pt will noted improved ability to grasp objects, and write with right hand Baseline:  Goal status: MET 12/06/2022 PLAN:  PT FREQUENCY: 2x/week  PT DURATION:4 weeks PLANNED INTERVENTIONS: Therapeutic exercises, Therapeutic activity, Neuromuscular re-education, Patient/Family education, Self Care, Orthotic/Fit training, Dry Needling, Moist heat, Manual lymph drainage, Manual therapy, Re-evaluation, and    PLAN FOR NEXT SESSION:  assess response to DN #3 and traction, conntinue Saunders home Trac,c, needs review of TB exs before given for HEP,Review MLD for right inferior  axillary swelling,Postural education, STM bilateral UT, Right pectorals, lateral trunk,  , progress HEP as able and progress to strength when feeling better, median nerve glides  Alvira Monday, PT  12/11/22 12:06 PM Phone: [phone] Fax: [phone]

## 2022-12-11 NOTE — Patient Instructions (Signed)
Over Head Pull: Narrow and Wide Grip   Cancer Rehab [phone]   On back, knees bent, feet flat, band across thighs, elbows straight but relaxed. Pull hands apart (start). Keeping elbows straight, bring arms up and over head, hands toward floor. Keep pull steady on band. Hold momentarily. Return slowly, keeping pull steady, back to start. Then do same with a wider grip on the band (past shoulder width) Repeat _5-10__ times. Band color __yellow____   Side Pull: Double Arm   On back, knees bent, feet flat. Arms perpendicular to body, shoulder level, elbows straight but relaxed. Pull arms out to sides, elbows straight. Resistance band comes across collarbones, hands toward floor. Hold momentarily. Slowly return to starting position. Repeat _5-10__ times. Band color _yellow____   Sword   On back, knees bent, feet flat, left hand on left hip, right hand above left. Pull right arm DIAGONALLY (hip to shoulder) across chest. Bring right arm along head toward floor. Hold momentarily. Slowly return to starting position. Repeat _5-10__ times. Do with left arm. Band color _yellow_____   Shoulder Rotation: Double Arm   On back, knees bent, feet flat, elbows tucked at sides, bent 90, hands palms up. Pull hands apart and down toward floor, keeping elbows near sides. Hold momentarily. Slowly return to starting position. Repeat _5-10__ times. Band color __yellow____    

## 2022-12-14 ENCOUNTER — Ambulatory Visit: Payer: Medicare Other | Admitting: Physical Therapy

## 2022-12-14 DIAGNOSIS — R293 Abnormal posture: Secondary | ICD-10-CM

## 2022-12-14 DIAGNOSIS — M542 Cervicalgia: Secondary | ICD-10-CM

## 2022-12-14 DIAGNOSIS — M25611 Stiffness of right shoulder, not elsewhere classified: Secondary | ICD-10-CM

## 2022-12-14 NOTE — Therapy (Signed)
OUTPATIENT PHYSICAL THERAPY  UPPER EXTREMITY ONCOLOGY TREATMENT  Patient Name: Christie Fox MRN: [id number] DOB:1955/08/27, 67 y.o., female Today's Date: 12/14/2022  END OF SESSION:  PT End of Session - 12/14/22 0835     Visit Number 14    Number of Visits 18    Date for PT Re-Evaluation 12/27/22    Authorization Type [employer]    Authorization Time Period 12/05/2022-01/02/2023    Authorization - Visit Number 3    Authorization - Number of Visits 8    PT Start Time 0836    PT Stop Time 0923    PT Time Calculation (min) 47 min    Activity Tolerance Patient tolerated treatment well               Past Medical History:  Diagnosis Date   Anxiety    Arthritis    knees   At risk for sleep apnea    STOP-BANG= 4    SENT TO PCP 07-31-2013   Borderline diabetes    diet controlled   Breast cancer (HCC)    Dysuria    History of kidney stones    Hyperlipidemia    Hypertension    Hypothyroidism    Migraines    PONV (postoperative nausea and vomiting)    "please call me Christie Fox to wake me up , not Christie Fox"    Renal calculus, left    Urgency of urination    Past Surgical History:  Procedure Laterality Date   BREAST LUMPECTOMY WITH RADIOACTIVE SEED LOCALIZATION Right 02/27/2022   Procedure: RIGHT BREAST LUMPECTOMY WITH RADIOACTIVE SEED LOCALIZATION X3;  Surgeon: Emelia Loron, MD;  Location: [facility];  Service: General;  Laterality: Right;   CESAREAN SECTION  x2   CYSTOSCOPY WITH RETROGRADE PYELOGRAM, URETEROSCOPY AND STENT PLACEMENT Left 08/04/2013   Procedure: 1ST STAGE CYSTOSCOPY WITH RETROGRADE PYELOGRAM, URETEROSCOPY AND STENT PLACEMENT;  Surgeon: Sebastian Ache, MD;  Location: [facility];  Service: Urology;  Laterality: Left;   CYSTOSCOPY WITH RETROGRADE PYELOGRAM, URETEROSCOPY AND STENT PLACEMENT Left 08/20/2013   Procedure: 2ND STAGE CYSTOSCOPY WITH RETROGRADE PYELOGRAM, URETEROSCOPY BASKET STONES AND STENT EXCHANGE, BLOOD MOP;  Surgeon: Sebastian Ache,  MD;  Location: [facility];  Service: Urology;  Laterality: Left;   EXPLORATORY LAPAROTOMY W/ BILATERAL SALPINOOPHORECTOMY  11-27-2001   HOLMIUM LASER APPLICATION Left 08/04/2013   Procedure: HOLMIUM LASER APPLICATION;  Surgeon: Sebastian Ache, MD;  Location: [facility];  Service: Urology;  Laterality: Left;   LAPAROSCOPIC GASTRIC SLEEVE RESECTION N/A 03/27/2017   Procedure: LAPAROSCOPIC GASTRIC SLEEVE RESECTION WITH UPPER ENDO;  Surgeon: Ovidio Kin, MD;  Location: [facility];  Service: General;  Laterality: N/A;   LEFT URETEROSCOPIC LASER LITHO STONE EXTRACTION AND STENT PLACEMENT  03-25-2005   LUMBAR FUSION  X2   LAST ONE 2000   ORIF LEFT ANKLE FX  03-20-2000   RETAINED HARDWARE   RADIOACTIVE SEED GUIDED EXCISIONAL BREAST BIOPSY Left 02/27/2022   Procedure: RADIOACTIVE SEED GUIDED EXCISIONAL LEFT BREAST BIOPSY;  Surgeon: Emelia Loron, MD;  Location: [facility];  Service: General;  Laterality: Left;   VAGINAL HYSTERECTOMY  1999   WISDOM TOOTH EXTRACTION     Patient Active Problem List   Diagnosis Date Noted   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) 12/30/2021   Hyperlipidemia 08/19/2014   Hypertension 08/19/2014   Hypothyroidism 08/19/2014   Primary osteoarthritis of both knees 08/19/2014    PCP: Brett Fairy, MD  REFERRING PROVIDER: Mancel Parsons, MD  REFERRING DIAG: Cervical and Right UE pain s/p Right breast Cancer with radiation  THERAPY DIAG:  Cervicalgia  Stiffness of right shoulder, not elsewhere classified  Abnormal posture  ONSET DATE: 05/2022  Rationale for Evaluation and Treatment: Rehabilitation  SUBJECTIVE:                                                                                                                                                                                           SUBJECTIVE STATEMENT:  I've got some trigger points right upper neck.  My flexibility is so much better.  I do my  ex's every morning religiously.  Surgery on the 25th.  I got a call from the company about the traction/waiting on insurance.  Sore that afternoon from DN.      EVAL Pts noticed pain in her shoulders approximately a month after radiation to the right axillary region post Right breast Cancer Surgery, and then started having throbbing pain in right UE, with tingling in right median nerve distribution. She feels a lot of tightness in the axillary region and notes swelling at the lateral trunk. She doesn't notice swelling in the right breast but the nipple gets chaffed. By 4:00 her bra has to come off. Pt has pain in bilateral UT and neck and difficulty turning. She feels weakness in her hand and has difficulty grasping things to write and carry.  PERTINENT HISTORY:  Pt with pain involving her neck and right shoulder, right arm, and right hand. Patient has a history significant for breast cancer with bilateral Lumpectomies; Right for DCIS, left with a Complex Sclerosing Lesion (Benign) and 0/1 incidental nodes. She had Adjuvant radiation on the right ending on 05/08/2022. Neck pain started 1-3 months after radiation. It is sharp, stabbing, aching and constant. Noted to have Diffuse DDD, Trace anterolisthesis at C3-C4 and C4-C5, and 2-3 mm anterolisthesis at C7-T1. Trace retrolisthesis at C6-C7,. Multilevel degenerative disc changes with disc space height loss most pronounced and moderate to severe at C5-C6 and C6-C7,Multilevel uncovertebral spurring and facet arthropathy throughout cervical spine.  PAIN:  Are you having pain? Yes NPRS scale:4- 5/10  Pain location:UT and right scapular area Pain orientation: Right  PAIN TYPE: throbbing if moving too fast, sometimes sharp Pain description:intermittent Aggravating factors: disturbed sleep, household chores,reaching with right UE,  Relieving factors: muscle relaxers some help, cervical traction  PRECAUTIONS: Prior foot ,ankle (rods and pins) Left LE  swelling,, back surgery, gastric sleeve, hysterectomy, pain in bilateral knees  RED FLAGS: Bowel or bladder incontinence: Yes:      WEIGHT BEARING RESTRICTIONS: No  FALLS:  Has patient fallen in last 6 months?  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[place]  HAND DOMINANCE: right   PRIOR LEVEL OF FUNCTION: Independent  PATIENT GOALS: Be able to function better   OBJECTIVE:  COGNITION: Overall cognitive status: Within functional limits for tasks assessed   PALPATION: Very tender bilateral UT, cervical region, Bilateral pectorals and scapula greatest on the right and  right lateral trunk, medial arm, forearm  OBSERVATIONS / OTHER ASSESSMENTS: bilateral scapular protraction, swelling observed inferior to Right axilla at lateral trunk  SENSATION: Light touch:   POSTURE: forward head, rounded shoulders  UPPER EXTREMITY AROM/PROM:  A/PROM RIGHT   eval  RIGHT 11/07/2022 RIGHT 11/29/2022  Shoulder extension 29, pain anterior shoulder 50 46 pain in shoulder blade  Shoulder flexion 127 pain under arm and upper arm 152 152  Shoulder abduction 108 posterior arm 155 154 pain at right lateral trunk  Shoulder internal rotation 60    Shoulder external rotation 83 shoulder joint      (Blank rows = not tested)  A/PROM LEFT   eval  Shoulder extension 33  Shoulder flexion 147  Shoulder abduction 143  Shoulder internal rotation 64  Shoulder external rotation 90    (Blank rows = not tested)  CERVICAL AROM: slight right All within normal limits:    Percent limited 11/29/2022  Flexion WNL pain  post neck and bilateral UT WdNL no pain  Extension Dec 20%, pain in R shoulder blade Dec 20% crepitus  Right lateral flexion Dec 15%, pain right UT/shoulderblade Decreased 60% crepitus/pain  Left lateral flexion WNL,Tight Right UT, pain left UT Decreased 40%, pain  Right  rotation Decreased 50%,, pain right neck, SB  Decreased 40% tight, pain into neck  Left rotation Decreased 20% bilateral neck, SB Decreased 10%    UPPER EXTREMITY STRENGTH:   LYMPHEDEMA ASSESSMENTS:   SURGERY TYPE/DATE: Bilateral breast lumpectomies;  NUMBER OF LYMPH NODES REMOVED: Left 0/1 incidental LN Right 0 LN's  CHEMOTHERAPY: No  RADIATION:Adjuvant ended 05/08/2022  HORMONE TREATMENT: recommended to take Anastrozole  INFECTIONS: NO   LYMPHEDEMA ASSESSMENTS:   LANDMARK RIGHT  eval  At axilla  35.1  15 cm proximal to olecranon process 33.7  10 cm proximal to olecranon process 32.6  Olecranon process 27.3  15 cm proximal to ulnar styloid process   10 cm proximal to ulnar styloid process 22.6  Just proximal to ulnar styloid process 16.8  Across hand at thumb web space 18.9  At base of 2nd digit 6.3  (Blank rows = not tested)  LANDMARK LEFT  eval  At axilla  34.8  15 cm proximal to olecranon process 33.0  10 cm proximal to olecranon process 29.8  Olecranon process 26.0  15 cm proximal to ulnar styloid process   10 cm proximal to ulnar styloid process 21.5  Just proximal to ulnar styloid process 16.4  Across hand at thumb web space 18.3  At base of 2nd digit 6.05  (Blank rows = not tested)   FUNCTIONAL TESTS: NT; has knee pain    QUICK DASH SURVEY: 70%   TODAY'S TREATMENT:        DATE: 12/14/22 Discussion of current ex program and status update Levator scap stretch per HEP Rhomboid stretch Per HEP Standing radial nerve glide x 10 per HEP Median nerve floss 10x per HEP Manual: Skilled palpation and monitoring of soft tissues during DN  Trigger Point Dry-Needling  Treatment instructions: Expect mild  to moderate muscle soreness. S/S of pneumothorax if dry needled over a lung field, and to seek immediate medical attention should they occur. Patient verbalized understanding of these instructions and education.  Patient Consent Given: Yes Education  handout provided: Yes Muscles treated:  R levator scap and B suboccipitals; right rhomboids, bil cervical multifidi Electrical stimulation performed: No Parameters: N/A Treatment response/outcome: decreased tissue tension STM to bilateral UT, levator, upper scapular region and posterior cervicals [facility] Home traction 13-15 lb pull static hold x  10 min  12/11/2022 STM to bilateral UT, levator, upper scapular region and posterior cervicals Manual traction, suboccipital release Supine scapular series x 5 ea except horizontal abd x 10 with yellow and updated HEP and gave pt yellow band [facility]unders Cervical Traction 3rd level: 15 # x 12 min static  12/07/2022 Bilateral rotation and SB x 5 Cervical retractions x 10 STM to bilateral UT, levator, upper scapular region and posterior cervicals AROM bilateral shoulder flexion, scaption, horizontal abduction x 5 Saunders Cervical Traction 3rd level, 4 min at 13#static, 7 min at 15# static  Pt has had excellent benefit from traction and DN. Arm and hand pain has resolved except for mild tingling in the tip of her middle finger . Her cervical ROM is also greatly improved. Pt gave permission for information to be sent to DME company regarding home traction   12/04/22 Seated nerve glide in prayer position x 10 Standing radial nerve glide with head tilt x 10 R SCM stretch 2 x 30 sec R Manual: Skilled palpation and monitoring of soft tissues during DN  Trigger Point Dry-Needling  Treatment instructions: Expect mild to moderate muscle soreness. S/S of pneumothorax if dry needled over a lung field, and to seek immediate medical attention should they occur. Patient verbalized understanding of these instructions and education.  Patient Consent Given: Yes Education handout provided: Yes Muscles treated:  R levator scap and B suboccipitals Electrical stimulation performed: No Parameters: N/A Treatment response/outcome: decreased tissue tension  STM to  bilateral UT, levator, upper scapular region and posterior cervicals  [facility] Home traction 13-15 lb pull static hold x  10 min  11/29/2022 Reviewed goals and assessed shoulder/neck ROM Cervical rotation x 5 B STW bilateral UT, levator, upper scapular region and posterior cervicals with cocoa butter Manual traction and suboccipital release [facility] Home traction with pt positioned several times to get comfortable started with 10 lb pull, but able to increase to 15# static hold x 7 min then complained of intermittent shooting pain into head and was discontinued. Despite discontinuing pt liked the traction and would like to try it again.    11/27/2022  Cervical retraction x 10  Scapular retraction 2# 2x5 Pre-DN cervical rotation: right 42, left 60 degrees Post DN right rotation 42 degrees, left 62 R SCM stretch 2x30 sec Manual therapy: STM to right only upper traps, levator scap, periscapular muscles, cervical paraspinals and scalenes. Supine cervical traction; seated cervical traction with rotation x 5 B SNAGS into extension x 10 Skilled palpation and monitoring of soft tissues during DN. Trigger Point Dry-Needling  Treatment instructions: Expect mild to moderate muscle soreness. S/S of pneumothorax if dry needled over a lung field, and to seek immediate medical attention should they occur. Patient verbalized understanding of these instructions and education.  Patient Consent Given: Yes Education handout provided: Yes Muscles treated:  right only upper traps, levator scap, teres,  cervical multifidi, suboccipitals Electrical stimulation performed: No Parameters: N/A Treatment response/outcome: decreased tender point size and number,  improved soft tissue mobility   11/22/2022   Pre-DN cervical rotation: right 12, left 60 degrees Post DN right rotation 28 degrees, left 62 Review of HEP: cervical retractions (to lengthen suboccipitals), cervical rotation combined with a little flexion  to lengthen levator scap  Manual therapy: right only upper traps, levator scap, periscapular muscles, cervical paraspinals Trigger Point Dry-Needling  Treatment instructions: Expect mild to moderate muscle soreness. S/S of pneumothorax if dry needled over a lung field, and to seek immediate medical attention should they occur. Patient verbalized understanding of these instructions and education.  Patient Consent Given: Yes Education handout provided: Yes Muscles treated:  right only upper traps, levator scap, teres,  cervical multifidi, suboccipitals Electrical stimulation performed: No Parameters: N/A Treatment response/outcome: decreased tender point size and number, improved soft tissue mobility                                                                               DATE: Pt has had excellent benefit from traction and DN. Arm and hand pain has resolved except for mild tingling in the tip of her middle finger . Her cervical ROM is also greatly improved. Pt gave permission for information to be sent to DME company regarding home traction   11/20/2022 Scapular retraction and shoulder ext with yellow band x 5 ea with emphasis on posture Seated cervical retraction x 7 increased no worse Supine cervical retractions 2 x 5 produced shoulder blade no worse, no change in UE Shoulder presses x 5 Supine manual traction and suboccipital release; decrease UD pain with manual traction STM to bilateral UT,  right pectorals, lateral trunk with cocoa butter PROM Right shoulder Flexion, scaption, abd, ER with multiple VC's to relax Supine bilateral AROM :shoulder flexion, scaption, and horizontal abduction MLD performed by therapist; Right supraclavicular LN's, 5 breaths, Right inguinal LN's, right axillo-inguinal pathway repeated multiple times for right inferior axillary swelling and ending with right inguinal  LN's.     PATIENT EDUCATION:  Education details: Discussed POC including number of  visits,  treatment interventions. Educated in supine AA shoulder flexion and supine stargazer, and sitting or standing scapular retraction to start gently at home to help Right shoulder ROM. Emphasized that pain should not be in shoulder joint Dry needling handout Person educated: Patient and Spouse Education method: Explanation and Handouts Education comprehension: verbalized understanding and returned demonstration  HOME EXERCISE PROGRAM  Supine scapular stabs: yellow x 5 Post op exercises; AA shoulder flexion, stargazer, sitting scapular retraction Access Code: [email or web address] URL: [email or web address] Date: 12/14/2022 Prepared by: Lavinia Sharps  Exercises - Sternocleidomastoid Stretch  - 2 x daily - 7 x weekly - 1 sets - 3 reps - 30 hold - Gentle Levator Scapulae Stretch (Mirrored)  - 1 x daily - 7 x weekly - 1 sets - 3 reps - 30 hold - Seated Rhomboid Stretch  - 1 x daily - 7 x weekly - 1 sets - 3 reps - 30 hold - Radial Nerve Flossing (Mirrored)  - 1 x daily - 7 x weekly - 1 sets - 10 reps - Median Nerve Flossing  - 1 x daily - 7 x weekly - 1 sets - 10 reps  ASSESSMENT:  CLINICAL IMPRESSION: The patient benefits significantly from dry needling to stimulate underlying myofascial trigger points and muscular tissue for management of neuromusculoskeletal pain and address movement impairments.  Much improved soft tissue mobility and decreased tender point size and number following treatment session.  She also responds well to traction and awaiting insurance approval for home version.  Therapist progressing and updating HEP for further functional mobility.  She states "I couldn't have done these a couple of months ago, that's how I know this is helping."      OBJECTIVE IMPAIRMENTS: decreased activity tolerance, decreased knowledge of condition, decreased ROM, decreased strength, increased edema, increased fascial restrictions, impaired sensation, impaired UE functional use,  postural dysfunction, and pain.   ACTIVITY LIMITATIONS: carrying, lifting, standing, squatting, sleeping, stairs, reach over head, and hygiene/grooming  PARTICIPATION LIMITATIONS: cleaning, laundry, driving, shopping, and usual household chores  PERSONAL FACTORS: 1-2 comorbidities: Right breast Cancer s/p radiation  are also affecting patient's functional outcome.   REHAB POTENTIAL: Good  CLINICAL DECISION MAKING: Stable/uncomplicated  EVALUATION COMPLEXITY: Low  GOALS: Goals reviewed with patient? Yes  SHORT TERM GOALS: Target date: 11/13/2022  Pt will be independent in a HEP for ROM/strength of cervical and right UE regions Baseline: Goal status: MET, 11/29/2022  2.  Pt will have decreased overall pain by 25% Baseline:  Goal status: MET 11/29/2022 3.  Pt will improve Right shoulder ROM by 20 degrees for shoulder abd, and 10 degrees for flexion for improved reaching Baseline:  Goal status: MET 11/07/2022 4.  Pt will have decreased c/o tingling in the right hand by 30% Baseline:  Goal status: MET 11/29/2022  5.  Quick dash will be improved by 20% to demonstrate improved function Baseline:  Goal status: INITIAL   LONG TERM GOALS: Target date: 12/04/2022  Pt will have decreased overall pain by 50% or greater Baseline:  Goal status: MET,  12/07/2022 2.  Pts quick dash will improve to no greater than 20% to demonstrate improved function Baseline:  Goal status: INITIAL  3.  Pt will have right shoulder ROM Within 10 degrees of left for improved function Baseline:  Goal status: MET 4.  Pts cervical ROM will be [facility] without increased pain Baseline:  Goal status: In Progress  5.  Pt will be able to sleep with minimal disturbance from pain Baseline:  Goal status: In progress 6.  Pt will noted improved ability to grasp objects, and write with right hand Baseline:  Goal status: MET 12/06/2022 PLAN:  PT FREQUENCY: 2x/week  PT DURATION:4 weeks PLANNED INTERVENTIONS:  Therapeutic exercises, Therapeutic activity, Neuromuscular re-education, Patient/Family education, Self Care, Orthotic/Fit training, Dry Needling, Moist heat, Manual lymph drainage, Manual therapy, Re-evaluation, and    PLAN FOR NEXT SESSION:  assess response to DN #4 and traction, continue [employer];  review of TB exs before given for HEP,Review MLD for right inferior axillary swelling,Postural education, STM bilateral UT, Right pectorals, lateral trunk,  , progress HEP as able and progress to strength when feeling better, median nerve glides  Lavinia Sharps, PT 12/14/22 9:28 AM Phone: [phone] Fax: [phone]

## 2022-12-15 ENCOUNTER — Other Ambulatory Visit: Payer: Self-pay

## 2022-12-15 ENCOUNTER — Encounter (HOSPITAL_BASED_OUTPATIENT_CLINIC_OR_DEPARTMENT_OTHER): Payer: Self-pay | Admitting: Plastic Surgery

## 2022-12-18 ENCOUNTER — Encounter (HOSPITAL_BASED_OUTPATIENT_CLINIC_OR_DEPARTMENT_OTHER)
Admission: RE | Admit: 2022-12-18 | Discharge: 2022-12-18 | Disposition: A | Discharge status: Home / Self Care | Payer: Medicare Other | Source: Ambulatory Visit | Attending: Plastic Surgery | Admitting: Plastic Surgery

## 2022-12-18 ENCOUNTER — Ambulatory Visit: Payer: Medicare Other

## 2022-12-18 DIAGNOSIS — Z01812 Encounter for preprocedural laboratory examination: Secondary | ICD-10-CM | POA: Diagnosis present

## 2022-12-18 DIAGNOSIS — Z923 Personal history of irradiation: Secondary | ICD-10-CM

## 2022-12-18 DIAGNOSIS — I10 Essential (primary) hypertension: Secondary | ICD-10-CM | POA: Insufficient documentation

## 2022-12-18 DIAGNOSIS — R293 Abnormal posture: Secondary | ICD-10-CM

## 2022-12-18 DIAGNOSIS — C50411 Malignant neoplasm of upper-outer quadrant of right female breast: Secondary | ICD-10-CM

## 2022-12-18 DIAGNOSIS — M542 Cervicalgia: Secondary | ICD-10-CM

## 2022-12-18 DIAGNOSIS — R600 Localized edema: Secondary | ICD-10-CM

## 2022-12-18 DIAGNOSIS — M25611 Stiffness of right shoulder, not elsewhere classified: Secondary | ICD-10-CM

## 2022-12-18 LAB — BASIC METABOLIC PANEL
Anion gap: 10 (ref 5–15)
BUN: 9 mg/dL (ref 8–23)
CO2: 27 mmol/L (ref 22–32)
Calcium: 9.5 mg/dL (ref 8.9–10.3)
Chloride: 102 mmol/L (ref 98–111)
Creatinine, Ser: 0.74 mg/dL (ref 0.44–1.00)
GFR, Estimated: >60 mL/min (ref >60)
Glucose, Bld: 121 mg/dL — ABNORMAL HIGH (ref 70–99)
Potassium: 4.1 mmol/L (ref 3.5–5.1)
Sodium: 139 mmol/L (ref 135–145)

## 2022-12-18 NOTE — Therapy (Signed)
OUTPATIENT PHYSICAL THERAPY  UPPER EXTREMITY ONCOLOGY TREATMENT  Patient Name: Christie Fox MRN: [id number] DOB:04-16-1955, 67 y.o., female Today's Date: 12/18/2022  END OF SESSION:  PT End of Session - 12/18/22 0754     Visit Number 15    Number of Visits 18    Date for PT Re-Evaluation 12/27/22    Authorization Type U[employer]    Authorization Time Period 12/05/2022-01/02/2023    Authorization - Visit Number 4    Authorization - Number of Visits 8    PT Start Time 0756    PT Stop Time 0854    PT Time Calculation (min) 58 min    Activity Tolerance Patient tolerated treatment well    Behavior During Therapy WFL for tasks assessed/performed               Past Medical History:  Diagnosis Date   Anxiety    Arthritis    knees   At risk for sleep apnea    STOP-BANG= 4    SENT TO PCP 07-31-2013   Borderline diabetes    diet controlled   Breast cancer (HCC)    Dysuria    History of kidney stones    Hyperlipidemia    Hypertension    Hypothyroidism    Migraines    PONV (postoperative nausea and vomiting)    "please call me [name] to wake me up , not Mrs Toennies"    Renal calculus, left    Urgency of urination    Past Surgical History:  Procedure Laterality Date   BREAST LUMPECTOMY WITH RADIOACTIVE SEED LOCALIZATION Right 02/27/2022   Procedure: RIGHT BREAST LUMPECTOMY WITH RADIOACTIVE SEED LOCALIZATION X3;  Surgeon: Emelia Loron, MD;  Location: [facility];  Service: General;  Laterality: Right;   CESAREAN SECTION  x2   CYSTOSCOPY WITH RETROGRADE PYELOGRAM, URETEROSCOPY AND STENT PLACEMENT Left 08/04/2013   Procedure: 1ST STAGE CYSTOSCOPY WITH RETROGRADE PYELOGRAM, URETEROSCOPY AND STENT PLACEMENT;  Surgeon: Sebastian Ache, MD;  Location: [facility];  Service: Urology;  Laterality: Left;   CYSTOSCOPY WITH RETROGRADE PYELOGRAM, URETEROSCOPY AND STENT PLACEMENT Left 08/20/2013   Procedure: 2ND STAGE CYSTOSCOPY WITH RETROGRADE PYELOGRAM, URETEROSCOPY BASKET  STONES AND STENT EXCHANGE, BLOOD MOP;  Surgeon: Sebastian Ache, MD;  Location: [facility];  Service: Urology;  Laterality: Left;   EXPLORATORY LAPAROTOMY W/ BILATERAL SALPINOOPHORECTOMY  11-27-2001   HOLMIUM LASER APPLICATION Left 08/04/2013   Procedure: HOLMIUM LASER APPLICATION;  Surgeon: Sebastian Ache, MD;  Location: [facility];  Service: Urology;  Laterality: Left;   LAPAROSCOPIC GASTRIC SLEEVE RESECTION N/A 03/27/2017   Procedure: LAPAROSCOPIC GASTRIC SLEEVE RESECTION WITH UPPER ENDO;  Surgeon: Ovidio Kin, MD;  Location: [facility];  Service: General;  Laterality: N/A;   LEFT URETEROSCOPIC LASER LITHO STONE EXTRACTION AND STENT PLACEMENT  03-25-2005   LUMBAR FUSION  X2   LAST ONE 2000   ORIF LEFT ANKLE FX  03-20-2000   RETAINED HARDWARE   RADIOACTIVE SEED GUIDED EXCISIONAL BREAST BIOPSY Left 02/27/2022   Procedure: RADIOACTIVE SEED GUIDED EXCISIONAL LEFT BREAST BIOPSY;  Surgeon: Emelia Loron, MD;  Location: [facility];  Service: General;  Laterality: Left;   VAGINAL HYSTERECTOMY  1999   WISDOM TOOTH EXTRACTION     Patient Active Problem List   Diagnosis Date Noted   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) 12/30/2021   Hyperlipidemia 08/19/2014   Hypertension 08/19/2014   Hypothyroidism 08/19/2014   Primary osteoarthritis of both knees 08/19/2014    PCP:  Brett Fairy, MD  REFERRING PROVIDER: Mancel Parsons, MD  REFERRING DIAG: Cervical and Right UE pain s/p Right breast Cancer with radiation  THERAPY DIAG:  Cervicalgia  Stiffness of right shoulder, not elsewhere classified  Abnormal posture  Status post radiation therapy  Malignant neoplasm of upper-outer quadrant of right breast in female, estrogen receptor positive (HCC)  Localized edema  ONSET DATE: 05/2022  Rationale for Evaluation and Treatment: Rehabilitation  SUBJECTIVE:                                                                                                                                                                                            SUBJECTIVE STATEMENT:  I got my home traction on Friday and I used it on Saturday. It went really well. I did well after dry needling. Kennyth Werth gave me some good pointers and I tried them all weekend. I still have some TP in my neck. I can reach now with my Right UE without difficulty, and I can hold a glass and drink now.  EVAL Pts noticed pain in her shoulders approximately a month after radiation to the right axillary region post Right breast Cancer Surgery, and then started having throbbing pain in right UE, with tingling in right median nerve distribution. She feels a lot of tightness in the axillary region and notes swelling at the lateral trunk. She doesn't notice swelling in the right breast but the nipple gets chaffed. By 4:00 her bra has to come off. Pt has pain in bilateral UT and neck and difficulty turning. She feels weakness in her hand and has difficulty grasping things to write and carry.  PERTINENT HISTORY:  Pt with pain involving her neck and right shoulder, right arm, and right hand. Patient has a history significant for breast cancer with bilateral Lumpectomies; Right for DCIS, left with a Complex Sclerosing Lesion (Benign) and 0/1 incidental nodes. She had Adjuvant radiation on the right ending on 05/08/2022. Neck pain started 1-3 months after radiation. It is sharp, stabbing, aching and constant. Noted to have Diffuse DDD, Trace anterolisthesis at C3-C4 and C4-C5, and 2-3 mm anterolisthesis at C7-T1. Trace retrolisthesis at C6-C7,. Multilevel degenerative disc changes with disc space height loss most pronounced and moderate to severe at C5-C6 and C6-C7,Multilevel uncovertebral spurring and facet arthropathy throughout cervical spine.  PAIN:  Are you having pain? Yes NPRS scale:2-3/10  Pain location:UT and right scapular area Pain orientation: Right  PAIN TYPE: throbbing if moving too  fast, sometimes sharp Pain description:intermittent Aggravating factors: disturbed sleep, household chores,  Relieving factors: muscle relaxers some help, cervical traction  PRECAUTIONS: Prior foot ,ankle (rods and  pins) Left LE swelling,, back surgery, gastric sleeve, hysterectomy, pain in bilateral knees  RED FLAGS: Bowel or bladder incontinence: Yes:      WEIGHT BEARING RESTRICTIONS: No  FALLS:  Has patient fallen in last 6 months? 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[place]  HAND DOMINANCE: right   PRIOR LEVEL OF FUNCTION: Independent  PATIENT GOALS: Be able to function better   OBJECTIVE:  COGNITION: Overall cognitive status: Within functional limits for tasks assessed   PALPATION: Very tender bilateral UT, cervical region, Bilateral pectorals and scapula greatest on the right and  right lateral trunk, medial arm, forearm  OBSERVATIONS / OTHER ASSESSMENTS: bilateral scapular protraction, swelling observed inferior to Right axilla at lateral trunk  SENSATION: Light touch:   POSTURE: forward head, rounded shoulders  UPPER EXTREMITY AROM/PROM:  A/PROM RIGHT   eval  RIGHT 11/07/2022 RIGHT 11/29/2022  Shoulder extension 29, pain anterior shoulder 50 46 pain in shoulder blade  Shoulder flexion 127 pain under arm and upper arm 152 152  Shoulder abduction 108 posterior arm 155 154 pain at right lateral trunk  Shoulder internal rotation 60    Shoulder external rotation 83 shoulder joint      (Blank rows = not tested)  A/PROM LEFT   eval  Shoulder extension 33  Shoulder flexion 147  Shoulder abduction 143  Shoulder internal rotation 64  Shoulder external rotation 90    (Blank rows = not tested)  CERVICAL AROM: slight right All within normal limits:    Percent limited 11/29/2022  Flexion WNL pain  post neck and bilateral UT WdNL no pain   Extension Dec 20%, pain in R shoulder blade Dec 20% crepitus  Right lateral flexion Dec 15%, pain right UT/shoulderblade Decreased 60% crepitus/pain  Left lateral flexion WNL,Tight Right UT, pain left UT Decreased 40%, pain  Right rotation Decreased 50%,, pain right neck, SB  Decreased 40% tight, pain into neck  Left rotation Decreased 20% bilateral neck, SB Decreased 10%    UPPER EXTREMITY STRENGTH:   LYMPHEDEMA ASSESSMENTS:   SURGERY TYPE/DATE: Bilateral breast lumpectomies;  NUMBER OF LYMPH NODES REMOVED: Left 0/1 incidental LN Right 0 LN's  CHEMOTHERAPY: No  RADIATION:Adjuvant ended 05/08/2022  HORMONE TREATMENT: recommended to take Anastrozole  INFECTIONS: NO   LYMPHEDEMA ASSESSMENTS:   LANDMARK RIGHT  eval  At axilla  35.1  15 cm proximal to olecranon process 33.7  10 cm proximal to olecranon process 32.6  Olecranon process 27.3  15 cm proximal to ulnar styloid process   10 cm proximal to ulnar styloid process 22.6  Just proximal to ulnar styloid process 16.8  Across hand at thumb web space 18.9  At base of 2nd digit 6.3  (Blank rows = not tested)  LANDMARK LEFT  eval  At axilla  34.8  15 cm proximal to olecranon process 33.0  10 cm proximal to olecranon process 29.8  Olecranon process 26.0  15 cm proximal to ulnar styloid process   10 cm proximal to ulnar styloid process 21.5  Just proximal to ulnar styloid process 16.4  Across hand at thumb web space 18.3  At base of 2nd digit 6.05  (Blank rows = not tested)   FUNCTIONAL TESTS: NT; has knee pain    QUICK DASH SURVEY: 70% EVAL,       12/18/2022   TODAY'S TREATMENT:        DATE:  02/16/2022 Standing  postural exercises with yellow band x 10 ea;scapular retraction, shoulder extension, bilateral ER with emphasis on good posture. STM to bilateral UT, levator, upper scapular region and posterior cervicals Manual traction, suboccipital release Saunders Cervical Traction 3rd level: 15 # x 12 min  static, pt was able to get into unit, adjust neck piece, and pump unit up to 15 lbs. Used head strap. Demonstrated ability to release pull and loosen head piece. Updated HEP with standing postural exercises. Reviewed band exercises every other day, ROM, nerve flossing daily  12/14/22 Discussion of current ex program and status update Levator scap stretch per HEP Rhomboid stretch Per HEP Standing radial nerve glide x 10 per HEP Median nerve floss 10x per HEP Manual: Skilled palpation and monitoring of soft tissues during DN  Trigger Point Dry-Needling  Treatment instructions: Expect mild to moderate muscle soreness. S/S of pneumothorax if dry needled over a lung field, and to seek immediate medical attention should they occur. Patient verbalized understanding of these instructions and education.  Patient Consent Given: Yes Education handout provided: Yes Muscles treated:  R levator scap and B suboccipitals; right rhomboids, bil cervical multifidi Electrical stimulation performed: No Parameters: N/A Treatment response/outcome: decreased tissue tension STM to bilateral UT, levator, upper scapular region and posterior cervicals Saunders Home traction 13-15 lb pull static hold x  10 min  12/11/2022 STM to bilateral UT, levator, upper scapular region and posterior cervicals Manual traction, suboccipital release Supine scapular series x 5 ea except horizontal abd x 10 with yellow and updated HEP and gave pt yellow band Saunders Cervical Traction 3rd level: 15 # x 12 min static  12/07/2022 Bilateral rotation and SB x 5 Cervical retractions x 10 STM to bilateral UT, levator, upper scapular region and posterior cervicals AROM bilateral shoulder flexion, scaption, horizontal abduction x 5 Saunders Cervical Traction 3rd level, 4 min at 13#static, 7 min at 15# static  Pt has had excellent benefit from traction and DN. Arm and hand pain has resolved except for mild tingling in the tip of her  middle finger . Her cervical ROM is also greatly improved. Pt gave permission for information to be sent to DME company regarding home traction   12/04/22 Seated nerve glide in prayer position x 10 Standing radial nerve glide with head tilt x 10 R SCM stretch 2 x 30 sec R Manual: Skilled palpation and monitoring of soft tissues during DN  Trigger Point Dry-Needling  Treatment instructions: Expect mild to moderate muscle soreness. S/S of pneumothorax if dry needled over a lung field, and to seek immediate medical attention should they occur. Patient verbalized understanding of these instructions and education.  Patient Consent Given: Yes Education handout provided: Yes Muscles treated:  R levator scap and B suboccipitals Electrical stimulation performed: No Parameters: N/A Treatment response/outcome: decreased tissue tension  STM to bilateral UT, levator, upper scapular region and posterior cervicals  Saunders Home traction 13-15 lb pull static hold x  10 min  11/29/2022 Reviewed goals and assessed shoulder/neck ROM Cervical rotation x 5 B STW bilateral UT, levator, upper scapular region and posterior cervicals with cocoa butter Manual traction and suboccipital release Saunders Home traction with pt positioned several times to get comfortable started with 10 lb pull, but able to increase to 15# static hold x 7 min then complained of intermittent shooting pain into head and was discontinued. Despite discontinuing pt liked the traction and would like to try it again.    11/27/2022  Cervical retraction x 10  Scapular  retraction 2# 2x5 Pre-DN cervical rotation: right 42, left 60 degrees Post DN right rotation 42 degrees, left 62 R SCM stretch 2x30 sec Manual therapy: STM to right only upper traps, levator scap, periscapular muscles, cervical paraspinals and scalenes. Supine cervical traction; seated cervical traction with rotation x 5 B SNAGS into extension x 10 Skilled palpation and  monitoring of soft tissues during DN. Trigger Point Dry-Needling  Treatment instructions: Expect mild to moderate muscle soreness. S/S of pneumothorax if dry needled over a lung field, and to seek immediate medical attention should they occur. Patient verbalized understanding of these instructions and education.  Patient Consent Given: Yes Education handout provided: Yes Muscles treated:  right only upper traps, levator scap, teres,  cervical multifidi, suboccipitals Electrical stimulation performed: No Parameters: N/A Treatment response/outcome: decreased tender point size and number, improved soft tissue mobility   11/22/2022   Pre-DN cervical rotation: right 12, left 60 degrees Post DN right rotation 28 degrees, left 62 Review of HEP: cervical retractions (to lengthen suboccipitals), cervical rotation combined with a little flexion to lengthen levator scap  Manual therapy: right only upper traps, levator scap, periscapular muscles, cervical paraspinals Trigger Point Dry-Needling  Treatment instructions: Expect mild to moderate muscle soreness. S/S of pneumothorax if dry needled over a lung field, and to seek immediate medical attention should they occur. Patient verbalized understanding of these instructions and education.  Patient Consent Given: Yes Education handout provided: Yes Muscles treated:  right only upper traps, levator scap, teres,  cervical multifidi, suboccipitals Electrical stimulation performed: No Parameters: N/A Treatment response/outcome: decreased tender point size and number, improved soft tissue mobility                                                                                11/20/2022 Scapular retraction and shoulder ext with yellow band x 5 ea with emphasis on posture Seated cervical retraction x 7 increased no worse Supine cervical retractions 2 x 5 produced shoulder blade no worse, no change in UE Shoulder presses x 5 Supine manual traction and  suboccipital release; decrease UD pain with manual traction STM to bilateral UT,  right pectorals, lateral trunk with cocoa butter PROM Right shoulder Flexion, scaption, abd, ER with multiple VC's to relax Supine bilateral AROM :shoulder flexion, scaption, and horizontal abduction MLD performed by therapist; Right supraclavicular LN's, 5 breaths, Right inguinal LN's, right axillo-inguinal pathway repeated multiple times for right inferior axillary swelling and ending with right inguinal  LN's.     PATIENT EDUCATION:  Standing postural exercises x 10; Scapular retraction, bilateral ER and bilateral shoulder extension with yellow  Education details: Discussed POC including number of visits,  treatment interventions. Educated in supine AA shoulder flexion and supine stargazer, and sitting or standing scapular retraction to start gently at home to help Right shoulder ROM. Emphasized that pain should not be in shoulder joint Dry needling handout Person educated: Patient and Spouse Education method: Explanation and Handouts Education comprehension: verbalized understanding and returned demonstration  HOME EXERCISE PROGRAM Standing postural exercises x 10; Scapular retraction, bilateral ER and bilateral shoulder extension with yellow, 3 days per week. Supine scapular stabs: yellow x 5 Post op exercises; AA shoulder flexion,  stargazer, sitting scapular retraction Access Code: [email or web address] URL: [email or web address] Date: 12/14/2022 Prepared by: Lavinia Sharps  Exercises - Sternocleidomastoid Stretch  - 2 x daily - 7 x weekly - 1 sets - 3 reps - 30 hold - Gentle Levator Scapulae Stretch (Mirrored)  - 1 x daily - 7 x weekly - 1 sets - 3 reps - 30 hold - Seated Rhomboid Stretch  - 1 x daily - 7 x weekly - 1 sets - 3 reps - 30 hold - Radial Nerve Flossing (Mirrored)  - 1 x daily - 7 x weekly - 1 sets - 10 reps - Median Nerve Flossing  - 1 x daily - 7 x weekly - 1 sets - 10  reps  ASSESSMENT:  CLINICAL IMPRESSION: Pt is getting excellent benefit from DN in addition to manual therapies, HEP and [facility] Traction unit. She just received her home unit. Pt is able to independently set up [place] home traction unit. She is now able to reach into cabinets, and can sleep through the night without pain. She has now achieved all goals except funtional ROM goal will be assessed next visit and she will be discharged next visit to independent self management.    OBJECTIVE IMPAIRMENTS: decreased activity tolerance, decreased knowledge of condition, decreased ROM, decreased strength, increased edema, increased fascial restrictions, impaired sensation, impaired UE functional use, postural dysfunction, and pain.   ACTIVITY LIMITATIONS: carrying, lifting, standing, squatting, sleeping, stairs, reach over head, and hygiene/grooming  PARTICIPATION LIMITATIONS: cleaning, laundry, driving, shopping, and usual household chores  PERSONAL FACTORS: 1-2 comorbidities: Right breast Cancer s/p radiation  are also affecting patient's functional outcome.   REHAB POTENTIAL: Good  CLINICAL DECISION MAKING: Stable/uncomplicated  EVALUATION COMPLEXITY: Low  GOALS: Goals reviewed with patient? Yes  SHORT TERM GOALS: Target date: 11/13/2022  Pt will be independent in a HEP for ROM/strength of cervical and right UE regions Baseline: Goal status: MET, 11/29/2022  2.  Pt will have decreased overall pain by 25% Baseline:  Goal status: MET 11/29/2022 3.  Pt will improve Right shoulder ROM by 20 degrees for shoulder abd, and 10 degrees for flexion for improved reaching Baseline:  Goal status: MET 11/07/2022 4.  Pt will have decreased c/o tingling in the right hand by 30% Baseline:  Goal status: MET 11/29/2022  5.  Quick dash will be improved by 20% to demonstrate improved function Baseline:  Goal status: MET 12/18/2022  LONG TERM GOALS: Target date: 12/04/2022  Pt will have  decreased overall pain by 50% or greater Baseline:  Goal status: MET,  12/07/2022 2.  Pts quick dash will improve to no greater than 20% to demonstrate improved function Baseline:  Goal status:MET 12/18/2022 (16%) 3.  Pt will have right shoulder ROM Within 10 degrees of left for improved function Baseline:  Goal status: MET 4.  Pts cervical ROM will be [facility] without increased pain Baseline:  Goal status: In Progress  5.  Pt will be able to sleep with minimal disturbance from pain Baseline:  Goal status: MET 12/18/2022 6.  Pt will noted improved ability to grasp objects, and write with right hand Baseline:  Goal status: MET 12/06/2022 PLAN:  PT FREQUENCY: 2x/week  PT DURATION:4 weeks PLANNED INTERVENTIONS: Therapeutic exercises, Therapeutic activity, Neuromuscular re-education, Patient/Family education, Self Care, Orthotic/Fit training, Dry Needling, Moist heat, Manual lymph drainage, Manual therapy, Re-evaluation, and    PLAN FOR NEXT SESSION:  DN x 1 more visit then DC;check cervical ROM goal,pt having Surgery on Nov 25  Clell Trahan  Naomii Kreger, PT 12/18/22 9:01 AM Phone: [phone] Fax: [phone]

## 2022-12-20 ENCOUNTER — Ambulatory Visit: Payer: Medicare Other | Admitting: Physical Therapy

## 2022-12-20 DIAGNOSIS — R293 Abnormal posture: Secondary | ICD-10-CM

## 2022-12-20 DIAGNOSIS — M542 Cervicalgia: Secondary | ICD-10-CM

## 2022-12-20 DIAGNOSIS — M25611 Stiffness of right shoulder, not elsewhere classified: Secondary | ICD-10-CM

## 2022-12-20 NOTE — H&P (Signed)
  Subjective Patient ID: Christie Fox is a 67 y.o. female.     HPI   Returns for follow up discussion prior to planned bilateral mastopexy. She is bothered by asymmetry ptosis and volume. Diagnosed with right breast cancer 2023 following screening MMG. Underwent bilateral breast lumpectomy 01/2022 for final left breast CSL, right breast medial DCIS intermediate grade, right breast lateral 1 cm and 4 mm area of IDC with DCIS, margins clear. ER/PR+, Her2-   Oncotype 22. Completed adjuvant RT 4.8.2024. Patient declined Anastrozole. Since last visit she has also declined any further Oncology follow up.   MMG 11/01/2022 demonstrated a 0.8 cm left breast mass 10 o clock, 4 cmfn. Mildly prominent left axillary LN noted that is to stable mammographically over years. 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 6 month follow MMG recommended   Mother with breast ca.    Prior to lumpectomy B cup. Reports loss volume post treatment and using insert bilateral. Reports right volume "nothing" and desires larger.    Highest weight 270 lb. Undwent sleeve gastroplasty 2019. Wt up 6 lb since last visit here.   Review of Systems       Objective Physical Exam  Cardiovascular: Normal rate, regular rhythm and normal heart sounds.    Pulmonary/Chest Effort normal and breath sounds normal.    Skin   Fitzpatrick 2    Lymph: no palpable adenopathy   Breasts: right breast hyperpigmentation, right volume< left Grade 2/3 ptosis bilateral SN to nipple R 27 L 28.5 cm BW R 21 L 23 cm Nipple to IMF R 6 L 8 cm Right areola and UOQ scar, left breast UOQ/lateral breast scar       Assessment/Plan History of right breast cancer  s/p lumpectomy History of therapeutic radiation   Patient desires overall larger volume and we have discussed breast augmentation. Counseled patient augmentation alone would leave NAC and breast tissue not centered over implant and would still require bilateral  mastopexy. Recommend we stage procedures with mastopexy bilateral first. Over left this will include some degree of breast tissue to create more symmetry volume prior to augmentation.   We reviewed bilateral breast lift. Reviewed anchor type scars, drains, OP surgery. Reviewed post operative limitations, visits. Reviewed with aging, time she will develop recurrent asymmetry as radiated breast will not develop as much ptosis as opposite breast. Counseled lift alone will not address volume. If she was satisfied with smaller right breast could adjust for this with resection breast volume over left. Patient does not like right breast volume and does not want to continue to use prosthesis. Reviewed options for adding volume to right breast include implant placement and/or fat grafting. Reviewed implants are not permanent devices, additional surgery related to them common. Counseled on implant risks including rupture and contracture, latter would be greater risk in setting RT. Counseled implant placement over right breast alone would also change overall appearance breast with more upper pole fullness compared with left. Reviewed overall increased risks surgery in setting RT including fat necrosis, wound healing problems.   Additional risks including but not limited to seroma, hematoma, bleeding, infection, prolonged wound healing, need for additional procedures, damage to adjacent structures, NAC necrosis, asymmetry, unacceptable cosmetic result, blood clots in legs or lungs reviewed. Completed ASPS consent for mastopexy.    Drain teaching completed. Rx for tramadol given.    Glenna Fellows, MD [facility]  Office/ physician access line after hours [phone]

## 2022-12-20 NOTE — Therapy (Signed)
OUTPATIENT PHYSICAL THERAPY  UPPER EXTREMITY ONCOLOGY TREATMENT/DISCHARGE SUMMARY  Patient Name: Christie Fox MRN: [id number] DOB:08-Sep-1955, 67 y.o., female Today's Date: 12/20/2022  END OF SESSION:  PT End of Session - 12/20/22 0755     Visit Number 16    Number of Visits 18    Date for PT Re-Evaluation 12/27/22    Authorization Type [employer]    Authorization Time Period 12/05/2022-01/02/2023    Authorization - Visit Number 5    Authorization - Number of Visits 8    PT Start Time 0757    PT Stop Time 0836    PT Time Calculation (min) 39 min    Activity Tolerance Patient tolerated treatment well               Past Medical History:  Diagnosis Date   Anxiety    Arthritis    knees   At risk for sleep apnea    STOP-BANG= 4    SENT TO PCP 07-31-2013   Borderline diabetes    diet controlled   Breast cancer (HCC)    Dysuria    History of kidney stones    Hyperlipidemia    Hypertension    Hypothyroidism    Migraines    PONV (postoperative nausea and vomiting)    "please call me Sahana to wake me up , not Mrs Urista"    Renal calculus, left    Urgency of urination    Past Surgical History:  Procedure Laterality Date   BREAST LUMPECTOMY WITH RADIOACTIVE SEED LOCALIZATION Right 02/27/2022   Procedure: RIGHT BREAST LUMPECTOMY WITH RADIOACTIVE SEED LOCALIZATION X3;  Surgeon: Emelia Loron, MD;  Location: [facility];  Service: General;  Laterality: Right;   CESAREAN SECTION  x2   CYSTOSCOPY WITH RETROGRADE PYELOGRAM, URETEROSCOPY AND STENT PLACEMENT Left 08/04/2013   Procedure: 1ST STAGE CYSTOSCOPY WITH RETROGRADE PYELOGRAM, URETEROSCOPY AND STENT PLACEMENT;  Surgeon: Sebastian Ache, MD;  Location: [facility];  Service: Urology;  Laterality: Left;   CYSTOSCOPY WITH RETROGRADE PYELOGRAM, URETEROSCOPY AND STENT PLACEMENT Left 08/20/2013   Procedure: 2ND STAGE CYSTOSCOPY WITH RETROGRADE PYELOGRAM, URETEROSCOPY BASKET STONES AND STENT EXCHANGE, BLOOD MOP;  Surgeon:  Sebastian Ache, MD;  Location: [facility];  Service: Urology;  Laterality: Left;   EXPLORATORY LAPAROTOMY W/ BILATERAL SALPINOOPHORECTOMY  11-27-2001   HOLMIUM LASER APPLICATION Left 08/04/2013   Procedure: HOLMIUM LASER APPLICATION;  Surgeon: Sebastian Ache, MD;  Location: [facility];  Service: Urology;  Laterality: Left;   LAPAROSCOPIC GASTRIC SLEEVE RESECTION N/A 03/27/2017   Procedure: LAPAROSCOPIC GASTRIC SLEEVE RESECTION WITH UPPER ENDO;  Surgeon: Ovidio Kin, MD;  Location: [facility];  Service: General;  Laterality: N/A;   LEFT URETEROSCOPIC LASER LITHO STONE EXTRACTION AND STENT PLACEMENT  03-25-2005   LUMBAR FUSION  X2   LAST ONE 2000   ORIF LEFT ANKLE FX  03-20-2000   RETAINED HARDWARE   RADIOACTIVE SEED GUIDED EXCISIONAL BREAST BIOPSY Left 02/27/2022   Procedure: RADIOACTIVE SEED GUIDED EXCISIONAL LEFT BREAST BIOPSY;  Surgeon: Emelia Loron, MD;  Location: [facility];  Service: General;  Laterality: Left;   VAGINAL HYSTERECTOMY  1999   WISDOM TOOTH EXTRACTION     Patient Active Problem List   Diagnosis Date Noted   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) 12/30/2021   Hyperlipidemia 08/19/2014   Hypertension 08/19/2014   Hypothyroidism 08/19/2014   Primary osteoarthritis of both knees 08/19/2014    PCP: Brett Fairy, MD  REFERRING PROVIDER: Mancel Parsons, MD  REFERRING DIAG: Cervical and Right UE pain s/p Right breast Cancer with radiation  THERAPY DIAG:  Cervicalgia  Stiffness of right shoulder, not elsewhere classified  Abnormal posture  ONSET DATE: 05/2022  Rationale for Evaluation and Treatment: Rehabilitation  SUBJECTIVE:                                                                                                                                                                                           SUBJECTIVE STATEMENT:  I'm sore.  Woke up at 4 AM with a pain in my arm.  Did stretches and that  relieved it. I know what to do now to help this. I got my traction unit and I liked it.   I have to have cataract surgery on both eyes.  I would like some DN to bil upper traps and up the right side of my neck.  I get a twinge when I turn my head.    EVAL Pts noticed pain in her shoulders approximately a month after radiation to the right axillary region post Right breast Cancer Surgery, and then started having throbbing pain in right UE, with tingling in right median nerve distribution. She feels a lot of tightness in the axillary region and notes swelling at the lateral trunk. She doesn't notice swelling in the right breast but the nipple gets chaffed. By 4:00 her bra has to come off. Pt has pain in bilateral UT and neck and difficulty turning. She feels weakness in her hand and has difficulty grasping things to write and carry.  PERTINENT HISTORY:  Pt with pain involving her neck and right shoulder, right arm, and right hand. Patient has a history significant for breast cancer with bilateral Lumpectomies; Right for DCIS, left with a Complex Sclerosing Lesion (Benign) and 0/1 incidental nodes. She had Adjuvant radiation on the right ending on 05/08/2022. Neck pain started 1-3 months after radiation. It is sharp, stabbing, aching and constant. Noted to have Diffuse DDD, Trace anterolisthesis at C3-C4 and C4-C5, and 2-3 mm anterolisthesis at C7-T1. Trace retrolisthesis at C6-C7,. Multilevel degenerative disc changes with disc space height loss most pronounced and moderate to severe at C5-C6 and C6-C7,Multilevel uncovertebral spurring and facet arthropathy throughout cervical spine.  PAIN:  Are you having pain? Yes NPRS scale:2-3/10  Pain location:UT and right scapular area Pain orientation: Right  PAIN TYPE: throbbing if moving too fast, sometimes sharp Pain description:intermittent Aggravating factors: disturbed sleep, household chores,  Relieving factors: muscle relaxers some help, cervical  traction  PRECAUTIONS: Prior foot ,ankle (rods and pins) Left LE swelling,, back surgery, gastric sleeve, hysterectomy, pain in bilateral knees  RED  FLAGS: Bowel or bladder incontinence: Yes:      WEIGHT BEARING RESTRICTIONS: No  FALLS:  Has patient fallen in last 6 months? 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[place]  HAND DOMINANCE: right   PRIOR LEVEL OF FUNCTION: Independent  PATIENT GOALS: Be able to function better   OBJECTIVE:  COGNITION: Overall cognitive status: Within functional limits for tasks assessed   PALPATION: Very tender bilateral UT, cervical region, Bilateral pectorals and scapula greatest on the right and  right lateral trunk, medial arm, forearm  OBSERVATIONS / OTHER ASSESSMENTS: bilateral scapular protraction, swelling observed inferior to Right axilla at lateral trunk  SENSATION: Light touch:   POSTURE: forward head, rounded shoulders  UPPER EXTREMITY AROM/PROM:  A/PROM RIGHT   eval  RIGHT 11/07/2022 RIGHT 11/29/2022 Right 11/20  Shoulder extension 29, pain anterior shoulder 50 46 pain in shoulder blade 62  Shoulder flexion 127 pain under arm and upper arm 152 152 160  Shoulder abduction 108 posterior arm 155 154 pain at right lateral trunk 175  Shoulder internal rotation 60   T10  Shoulder external rotation 83 shoulder joint   71    (Blank rows = not tested)  A/PROM LEFT   eval  Shoulder extension 33  Shoulder flexion 147  Shoulder abduction 143  Shoulder internal rotation 64  Shoulder external rotation 90    (Blank rows = not tested)  CERVICAL AROM: slight right All within normal limits:    Percent limited 11/29/2022 11/20  Flexion WNL pain  post neck and bilateral UT WdNL no pain 60  Extension Dec 20%, pain in R shoulder blade Dec 20% crepitus 60 full no pain  Right lateral flexion Dec 15%, pain right  UT/shoulderblade Decreased 60% crepitus/pain 35  Left lateral flexion WNL,Tight Right UT, pain left UT Decreased 40%, pain 35 pull no pain  Right rotation Decreased 50%,, pain right neck, SB  Decreased 40% tight, pain into neck 33 preDN 40 post DN  Left rotation Decreased 20% bilateral neck, SB Decreased 10% 53   60 post DN    UPPER EXTREMITY STRENGTH:   LYMPHEDEMA ASSESSMENTS:   SURGERY TYPE/DATE: Bilateral breast lumpectomies;  NUMBER OF LYMPH NODES REMOVED: Left 0/1 incidental LN Right 0 LN's  CHEMOTHERAPY: No  RADIATION:Adjuvant ended 05/08/2022  HORMONE TREATMENT: recommended to take Anastrozole  INFECTIONS: NO   LYMPHEDEMA ASSESSMENTS:   LANDMARK RIGHT  eval  At axilla  35.1  15 cm proximal to olecranon process 33.7  10 cm proximal to olecranon process 32.6  Olecranon process 27.3  15 cm proximal to ulnar styloid process   10 cm proximal to ulnar styloid process 22.6  Just proximal to ulnar styloid process 16.8  Across hand at thumb web space 18.9  At base of 2nd digit 6.3  (Blank rows = not tested)  LANDMARK LEFT  eval  At axilla  34.8  15 cm proximal to olecranon process 33.0  10 cm proximal to olecranon process 29.8  Olecranon process 26.0  15 cm proximal to ulnar styloid process   10 cm proximal to ulnar styloid process 21.5  Just proximal to ulnar styloid process 16.4  Across hand at thumb web space 18.3  At base of 2nd digit 6.05  (Blank rows = not tested)   FUNCTIONAL TESTS: NT; has knee pain    QUICK DASH SURVEY: 70% EVAL,    16%   12/18/2022  TODAY'S TREATMENT:        DATE: 12/20/22 Discussion of current ex program and status update Shoulder ROM Cervical ROM Manual: Skilled palpation and monitoring of soft tissues during DN  Trigger Point Dry-Needling  Treatment instructions: Expect mild to moderate muscle soreness. S/S of pneumothorax if dry needled over a lung field, and to seek immediate medical attention should they occur.  Patient verbalized understanding of these instructions and education.  Patient Consent Given: Yes Education handout provided: Yes Muscles treated:  bil upper traps; bil levator scap right rhomboids, right cervical multifidi Electrical stimulation performed: No Parameters: N/A Treatment response/outcome: decreased tissue tension  02/16/2022 Standing postural exercises with yellow band x 10 ea;scapular retraction, shoulder extension, bilateral ER with emphasis on good posture. STM to bilateral UT, levator, upper scapular region and posterior cervicals Manual traction, suboccipital release Saunders Cervical Traction 3rd level: 15 # x 12 min static, pt was able to get into unit, adjust neck piece, and pump unit up to 15 lbs. Used head strap. Demonstrated ability to release pull and loosen head piece. Updated HEP with standing postural exercises. Reviewed band exercises every other day, ROM, nerve flossing daily  12/14/22 Discussion of current ex program and status update Levator scap stretch per HEP Rhomboid stretch Per HEP Standing radial nerve glide x 10 per HEP Median nerve floss 10x per HEP Manual: Skilled palpation and monitoring of soft tissues during DN  Trigger Point Dry-Needling  Treatment instructions: Expect mild to moderate muscle soreness. S/S of pneumothorax if dry needled over a lung field, and to seek immediate medical attention should they occur. Patient verbalized understanding of these instructions and education.  Patient Consent Given: Yes Education handout provided: Yes Muscles treated:  R levator scap and B suboccipitals; right rhomboids, bil cervical multifidi Electrical stimulation performed: No Parameters: N/A Treatment response/outcome: decreased tissue tension STM to bilateral UT, levator, upper scapular region and posterior cervicals Saunders Home traction 13-15 lb pull static hold x  10 min  12/11/2022 STM to bilateral UT, levator, upper scapular region and  posterior cervicals Manual traction, suboccipital release Supine scapular series x 5 ea except horizontal abd x 10 with yellow and updated HEP and gave pt yellow band Saunders Cervical Traction 3rd level: 15 # x 12 min static  12/07/2022 Bilateral rotation and SB x 5 Cervical retractions x 10 STM to bilateral UT, levator, upper scapular region and posterior cervicals AROM bilateral shoulder flexion, scaption, horizontal abduction x 5 Saunders Cervical Traction 3rd level, 4 min at 13#static, 7 min at 15# static  Pt has had excellent benefit from traction and DN. Arm and hand pain has resolved except for mild tingling in the tip of her middle finger . Her cervical ROM is also greatly improved. Pt gave permission for information to be sent to DME company regarding home traction   12/04/22 Seated nerve glide in prayer position x 10 Standing radial nerve glide with head tilt x 10 R SCM stretch 2 x 30 sec R Manual: Skilled palpation and monitoring of soft tissues during DN  Trigger Point Dry-Needling  Treatment instructions: Expect mild to moderate muscle soreness. S/S of pneumothorax if dry needled over a lung field, and to seek immediate medical attention should they occur. Patient verbalized understanding of these instructions and education.  Patient Consent Given: Yes Education handout provided: Yes Muscles treated:  R levator scap and B suboccipitals Electrical stimulation performed: No Parameters: N/A Treatment response/outcome: decreased tissue tension  STM to bilateral UT, levator, upper scapular  region and posterior cervicals  [facility] traction 13-15 lb pull static hold x  10 min   PATIENT EDUCATION:  Standing postural exercises x 10; Scapular retraction, bilateral ER and bilateral shoulder extension with yellow  Education details: Discussed POC including number of visits,  treatment interventions. Educated in supine AA shoulder flexion and supine stargazer, and sitting or  standing scapular retraction to start gently at home to help Right shoulder ROM. Emphasized that pain should not be in shoulder joint Dry needling handout Person educated: Patient and Spouse Education method: Explanation and Handouts Education comprehension: verbalized understanding and returned demonstration  HOME EXERCISE PROGRAM Standing postural exercises x 10; Scapular retraction, bilateral ER and bilateral shoulder extension with yellow, 3 days per week. Supine scapular stabs: yellow x 5 Post op exercises; AA shoulder flexion, stargazer, sitting scapular retraction Access Code: [email or web address] URL: [email or web address] Date: 12/14/2022 Prepared by: Lavinia Sharps  Exercises - Sternocleidomastoid Stretch  - 2 x daily - 7 x weekly - 1 sets - 3 reps - 30 hold - Gentle Levator Scapulae Stretch (Mirrored)  - 1 x daily - 7 x weekly - 1 sets - 3 reps - 30 hold - Seated Rhomboid Stretch  - 1 x daily - 7 x weekly - 1 sets - 3 reps - 30 hold - Radial Nerve Flossing (Mirrored)  - 1 x daily - 7 x weekly - 1 sets - 10 reps - Median Nerve Flossing  - 1 x daily - 7 x weekly - 1 sets - 10 reps  ASSESSMENT:  CLINICAL IMPRESSION: The patient has met all rehab goals, with noted improvements in pain reduction, outcome score, ROM, strength and functional mobility.  A comprehensive HEP has been established and anticipate further improvements over time with regular performance of the program. She has a home cervical traction device which she is independent in set up and use with good relief reported.  Recommend discharge from PT at this time.   OBJECTIVE IMPAIRMENTS: decreased activity tolerance, decreased knowledge of condition, decreased ROM, decreased strength, increased edema, increased fascial restrictions, impaired sensation, impaired UE functional use, postural dysfunction, and pain.   ACTIVITY LIMITATIONS: carrying, lifting, standing, squatting, sleeping, stairs, reach over head, and  hygiene/grooming  PARTICIPATION LIMITATIONS: cleaning, laundry, driving, shopping, and usual household chores  PERSONAL FACTORS: 1-2 comorbidities: Right breast Cancer s/p radiation  are also affecting patient's functional outcome.   REHAB POTENTIAL: Good  CLINICAL DECISION MAKING: Stable/uncomplicated  EVALUATION COMPLEXITY: Low  GOALS: Goals reviewed with patient? Yes  SHORT TERM GOALS: Target date: 11/13/2022  Pt will be independent in a HEP for ROM/strength of cervical and right UE regions Baseline: Goal status: MET, 11/29/2022  2.  Pt will have decreased overall pain by 25% Baseline:  Goal status: MET 11/29/2022 3.  Pt will improve Right shoulder ROM by 20 degrees for shoulder abd, and 10 degrees for flexion for improved reaching Baseline:  Goal status: MET 11/07/2022 4.  Pt will have decreased c/o tingling in the right hand by 30% Baseline:  Goal status: MET 11/29/2022  5.  Quick dash will be improved by 20% to demonstrate improved function Baseline:  Goal status: MET 12/18/2022  LONG TERM GOALS: Target date: 12/04/2022  Pt will have decreased overall pain by 50% or greater Baseline:  Goal status: MET,  12/07/2022 2.  Pts quick dash will improve to no greater than 20% to demonstrate improved function Baseline:  Goal status:MET 12/18/2022 (16%) 3.  Pt will have right shoulder ROM Within 10 degrees  of left for improved function Baseline:  Goal status: MET 4.  Pts cervical ROM will be [facility] without increased pain Baseline:  Goal status: met 11/20  5.  Pt will be able to sleep with minimal disturbance from pain Baseline:  Goal status: MET 12/18/2022 6.  Pt will noted improved ability to grasp objects, and write with right hand Baseline:  Goal status: MET 12/06/2022 PLAN: PHYSICAL THERAPY DISCHARGE SUMMARY  Visits from Start of Care: 16  Current functional level related to goals / functional outcomes: See clinical impressions above   Remaining deficits: As  above   Education / Equipment: HEP; Saunders cervical traction device   Patient agrees to discharge. Patient goals were met. Patient is being discharged due to meeting the stated rehab goals.  Lavinia Sharps, PT 12/20/22 4:52 PM Phone: [phone] Fax: [phone]

## 2022-12-24 NOTE — Anesthesia Preprocedure Evaluation (Signed)
Anesthesia Evaluation  Patient identified by MRN, date of birth, ID band Patient awake    Reviewed: Allergy & Precautions, NPO status , Patient's Chart, lab work & pertinent test results  History of Anesthesia Complications (+) PONV and history of anesthetic complications  Airway Mallampati: II  TM Distance: >3 FB Neck ROM: Full   Comment: Previous grade I view with MAC 4, easy mask Dental  (+) Dental Advisory Given   Pulmonary neg pulmonary ROS   Pulmonary exam normal breath sounds clear to auscultation       Cardiovascular hypertension (perindopril), Pt. on medications (-) angina (-) Past MI, (-) Cardiac Stents and (-) CABG (-) dysrhythmias  Rhythm:Regular Rate:Normal  HLD   Neuro/Psych  Headaches, neg Seizures PSYCHIATRIC DISORDERS Anxiety        GI/Hepatic Neg liver ROS,neg GERD  ,,S/p gastric sleeve   Endo/Other  Hypothyroidism    Renal/GU negative Renal ROS     Musculoskeletal  (+) Arthritis , Osteoarthritis,    Abdominal  (+) + obese  Peds  Hematology negative hematology ROS (+)   Anesthesia Other Findings   Reproductive/Obstetrics Breast cancer                              Anesthesia Physical Anesthesia Plan  ASA: 3  Anesthesia Plan: General   Post-op Pain Management: Tylenol PO (pre-op)*   Induction: Intravenous  PONV Risk Score and Plan: 4 or greater and Ondansetron, Dexamethasone, Propofol infusion, Treatment may vary due to age or medical condition and TIVA  Airway Management Planned: Oral ETT  Additional Equipment:   Intra-op Plan:   Post-operative Plan: Extubation in OR  Informed Consent: I have reviewed the patients History and Physical, chart, labs and discussed the procedure including the risks, benefits and alternatives for the proposed anesthesia with the patient or authorized representative who has indicated his/her understanding and acceptance.      Dental advisory given  Plan Discussed with: CRNA and Anesthesiologist  Anesthesia Plan Comments: (Risks of general anesthesia discussed including, but not limited to, sore throat, hoarse voice, chipped/damaged teeth, injury to vocal cords, nausea and vomiting, allergic reactions, lung infection, heart attack, stroke, and death. All questions answered. )        Anesthesia Quick Evaluation

## 2022-12-25 ENCOUNTER — Ambulatory Visit (HOSPITAL_BASED_OUTPATIENT_CLINIC_OR_DEPARTMENT_OTHER): Payer: Medicare Other | Admitting: Anesthesiology

## 2022-12-25 ENCOUNTER — Other Ambulatory Visit: Payer: Self-pay

## 2022-12-25 ENCOUNTER — Encounter (HOSPITAL_BASED_OUTPATIENT_CLINIC_OR_DEPARTMENT_OTHER): Payer: Self-pay | Admitting: Plastic Surgery

## 2022-12-25 ENCOUNTER — Encounter (HOSPITAL_BASED_OUTPATIENT_CLINIC_OR_DEPARTMENT_OTHER)
Admission: RE | Disposition: A | Discharge status: Home / Self Care | Payer: Self-pay | Source: Home / Self Care | Attending: Plastic Surgery

## 2022-12-25 ENCOUNTER — Ambulatory Visit (HOSPITAL_BASED_OUTPATIENT_CLINIC_OR_DEPARTMENT_OTHER)
Admission: RE | Admit: 2022-12-25 | Discharge: 2022-12-25 | Disposition: A | Discharge status: Home / Self Care | Payer: Medicare Other | Attending: Plastic Surgery | Admitting: Plastic Surgery

## 2022-12-25 DIAGNOSIS — N6082 Other benign mammary dysplasias of left breast: Secondary | ICD-10-CM | POA: Diagnosis not present

## 2022-12-25 DIAGNOSIS — I10 Essential (primary) hypertension: Secondary | ICD-10-CM | POA: Insufficient documentation

## 2022-12-25 DIAGNOSIS — N6022 Fibroadenosis of left breast: Secondary | ICD-10-CM | POA: Insufficient documentation

## 2022-12-25 DIAGNOSIS — N6489 Other specified disorders of breast: Secondary | ICD-10-CM | POA: Diagnosis not present

## 2022-12-25 DIAGNOSIS — Z9884 Bariatric surgery status: Secondary | ICD-10-CM | POA: Diagnosis not present

## 2022-12-25 DIAGNOSIS — Z923 Personal history of irradiation: Secondary | ICD-10-CM | POA: Diagnosis not present

## 2022-12-25 DIAGNOSIS — Z803 Family history of malignant neoplasm of breast: Secondary | ICD-10-CM | POA: Diagnosis not present

## 2022-12-25 DIAGNOSIS — Z421 Encounter for breast reconstruction following mastectomy: Secondary | ICD-10-CM | POA: Insufficient documentation

## 2022-12-25 DIAGNOSIS — Z853 Personal history of malignant neoplasm of breast: Secondary | ICD-10-CM | POA: Diagnosis not present

## 2022-12-25 HISTORY — PX: MASTOPEXY: SHX5358

## 2022-12-25 SURGERY — MASTOPEXY
Anesthesia: General | Site: Breast | Laterality: Bilateral

## 2022-12-25 MED ORDER — ACETAMINOPHEN 500 MG PO TABS
1000.0000 mg | ORAL_TABLET | ORAL | Status: AC
  Administered 2022-12-25: 1000 mg via ORAL

## 2022-12-25 MED ORDER — CEFAZOLIN SODIUM-DEXTROSE 2-4 GM/100ML-% IV SOLN
2.0000 g | INTRAVENOUS | Status: AC
  Administered 2022-12-25: 2 g via INTRAVENOUS

## 2022-12-25 MED ORDER — FENTANYL CITRATE (PF) 100 MCG/2ML IJ SOLN
INTRAMUSCULAR | Status: AC
  Filled 2022-12-25: qty 2

## 2022-12-25 MED ORDER — SUCCINYLCHOLINE CHLORIDE 200 MG/10ML IV SOSY
PREFILLED_SYRINGE | INTRAVENOUS | Status: DC | PRN
  Administered 2022-12-25: 100 mg via INTRAVENOUS

## 2022-12-25 MED ORDER — CHLORHEXIDINE GLUCONATE CLOTH 2 % EX PADS: 6.0000 | MEDICATED_PAD | Freq: Once | CUTANEOUS | Status: DC

## 2022-12-25 MED ORDER — OXYCODONE HCL 5 MG/5ML PO SOLN: 5.0000 mg | Freq: Once | ORAL | Status: AC | PRN

## 2022-12-25 MED ORDER — PROPOFOL 10 MG/ML IV BOLUS
INTRAVENOUS | Status: AC
  Filled 2022-12-25: qty 20

## 2022-12-25 MED ORDER — LACTATED RINGERS IV SOLN: INTRAVENOUS | Status: DC

## 2022-12-25 MED ORDER — PROPOFOL 500 MG/50ML IV EMUL
INTRAVENOUS | Status: AC
  Filled 2022-12-25: qty 50

## 2022-12-25 MED ORDER — FENTANYL CITRATE (PF) 100 MCG/2ML IJ SOLN
25.0000 ug | INTRAMUSCULAR | Status: DC | PRN
  Administered 2022-12-25: 50 ug via INTRAVENOUS
  Administered 2022-12-25: 25 ug via INTRAVENOUS

## 2022-12-25 MED ORDER — BUPIVACAINE HCL (PF) 0.5 % IJ SOLN
INTRAMUSCULAR | Status: DC | PRN
  Administered 2022-12-25: 30 mL

## 2022-12-25 MED ORDER — DEXAMETHASONE SODIUM PHOSPHATE 10 MG/ML IJ SOLN
INTRAMUSCULAR | Status: DC | PRN
  Administered 2022-12-25: 10 mg via INTRAVENOUS

## 2022-12-25 MED ORDER — ONDANSETRON HCL 4 MG/2ML IJ SOLN
INTRAMUSCULAR | Status: DC | PRN
  Administered 2022-12-25: 4 mg via INTRAVENOUS

## 2022-12-25 MED ORDER — PROPOFOL 10 MG/ML IV BOLUS
INTRAVENOUS | Status: DC | PRN
  Administered 2022-12-25: 100 mg via INTRAVENOUS

## 2022-12-25 MED ORDER — OXYCODONE HCL 5 MG PO TABS
5.0000 mg | ORAL_TABLET | Freq: Once | ORAL | Status: AC | PRN
  Administered 2022-12-25: 5 mg via ORAL

## 2022-12-25 MED ORDER — ACETAMINOPHEN 500 MG PO TABS
ORAL_TABLET | ORAL | Status: AC
  Filled 2022-12-25: qty 2

## 2022-12-25 MED ORDER — OXYCODONE HCL 5 MG PO TABS
ORAL_TABLET | ORAL | Status: AC
  Filled 2022-12-25: qty 1

## 2022-12-25 MED ORDER — CEFAZOLIN SODIUM-DEXTROSE 2-4 GM/100ML-% IV SOLN
INTRAVENOUS | Status: AC
  Filled 2022-12-25: qty 100

## 2022-12-25 MED ORDER — 0.9 % SODIUM CHLORIDE (POUR BTL) OPTIME
TOPICAL | Status: DC | PRN
  Administered 2022-12-25: 400 mL

## 2022-12-25 MED ORDER — LIDOCAINE 2% (20 MG/ML) 5 ML SYRINGE
INTRAMUSCULAR | Status: DC | PRN
  Administered 2022-12-25: 100 mg via INTRAVENOUS

## 2022-12-25 MED ORDER — HYDROMORPHONE HCL 1 MG/ML IJ SOLN
INTRAMUSCULAR | Status: DC | PRN
  Administered 2022-12-25 (×2): .25 mg via INTRAVENOUS

## 2022-12-25 MED ORDER — SODIUM CHLORIDE 0.9 % IV SOLN: INTRAVENOUS | Status: DC | PRN

## 2022-12-25 MED ORDER — HYDROMORPHONE HCL 1 MG/ML IJ SOLN
INTRAMUSCULAR | Status: AC
  Filled 2022-12-25: qty 0.5

## 2022-12-25 MED ORDER — FENTANYL CITRATE (PF) 250 MCG/5ML IJ SOLN
INTRAMUSCULAR | Status: DC | PRN
  Administered 2022-12-25 (×2): 50 ug via INTRAVENOUS

## 2022-12-25 MED ORDER — PROPOFOL 500 MG/50ML IV EMUL
INTRAVENOUS | Status: DC | PRN
  Administered 2022-12-25: 150 ug/kg/min via INTRAVENOUS

## 2022-12-25 MED ORDER — PROPOFOL 1000 MG/100ML IV EMUL
INTRAVENOUS | Status: AC
  Filled 2022-12-25: qty 200

## 2022-12-25 MED ORDER — AMISULPRIDE (ANTIEMETIC) 5 MG/2ML IV SOLN: 10.0000 mg | Freq: Once | INTRAVENOUS | Status: DC | PRN

## 2022-12-25 SURGICAL SUPPLY — 57 items
BAG DECANTER FOR FLEXI CONT (MISCELLANEOUS) ×1 IMPLANT
BINDER BREAST 3XL (GAUZE/BANDAGES/DRESSINGS) IMPLANT
BINDER BREAST LRG (GAUZE/BANDAGES/DRESSINGS) IMPLANT
BINDER BREAST MEDIUM (GAUZE/BANDAGES/DRESSINGS) IMPLANT
BINDER BREAST XLRG (GAUZE/BANDAGES/DRESSINGS) IMPLANT
BINDER BREAST XXLRG (GAUZE/BANDAGES/DRESSINGS) IMPLANT
BLADE SURG 10 STRL SS (BLADE) ×1 IMPLANT
BLADE SURG 15 STRL LF DISP TIS (BLADE) IMPLANT
BNDG GAUZE DERMACEA FLUFF 4 (GAUZE/BANDAGES/DRESSINGS) ×2 IMPLANT
CANISTER SUCT 1200ML W/VALVE (MISCELLANEOUS) ×1 IMPLANT
CHLORAPREP W/TINT 26 (MISCELLANEOUS) ×1 IMPLANT
COVER MAYO STAND STRL (DRAPES) ×1 IMPLANT
DERMABOND ADVANCED .7 DNX12 (GAUZE/BANDAGES/DRESSINGS) ×1 IMPLANT
DRAIN CHANNEL 15F RND FF W/TCR (WOUND CARE) IMPLANT
DRAIN CHANNEL 19F RND (DRAIN) IMPLANT
DRAPE TOP ARMCOVERS (MISCELLANEOUS) ×1 IMPLANT
DRAPE U-SHAPE 76X120 STRL (DRAPES) ×1 IMPLANT
DRAPE UTILITY XL STRL (DRAPES) ×1 IMPLANT
DRSG TEGADERM 2-3/8X2-3/4 SM (GAUZE/BANDAGES/DRESSINGS) IMPLANT
ELECT BLADE 4.0 EZ CLEAN MEGAD (MISCELLANEOUS)
ELECT COATED BLADE 2.86 ST (ELECTRODE) ×1 IMPLANT
ELECT REM PT RETURN 9FT ADLT (ELECTROSURGICAL) ×1
ELECTRODE BLDE 4.0 EZ CLN MEGD (MISCELLANEOUS) IMPLANT
ELECTRODE REM PT RTRN 9FT ADLT (ELECTROSURGICAL) ×1 IMPLANT
EVACUATOR SILICONE 100CC (DRAIN) IMPLANT
GAUZE PAD ABD 8X10 STRL (GAUZE/BANDAGES/DRESSINGS) ×2 IMPLANT
GAUZE SPONGE 4X4 12PLY STRL (GAUZE/BANDAGES/DRESSINGS) IMPLANT
GAUZE SPONGE 4X4 12PLY STRL LF (GAUZE/BANDAGES/DRESSINGS) IMPLANT
GLOVE BIO SURGEON STRL SZ 6 (GLOVE) ×1 IMPLANT
GLOVE BIOGEL PI IND STRL 6.5 (GLOVE) IMPLANT
GOWN STRL REUS W/ TWL LRG LVL3 (GOWN DISPOSABLE) ×2 IMPLANT
MARKER SKIN DUAL TIP RULER LAB (MISCELLANEOUS) IMPLANT
NDL HYPO 25X1 1.5 SAFETY (NEEDLE) IMPLANT
NEEDLE HYPO 25X1 1.5 SAFETY (NEEDLE) ×1
NS IRRIG 1000ML POUR BTL (IV SOLUTION) ×1 IMPLANT
PACK BASIN DAY SURGERY FS (CUSTOM PROCEDURE TRAY) ×1 IMPLANT
PENCIL SMOKE EVACUATOR (MISCELLANEOUS) ×1 IMPLANT
PIN SAFETY STERILE (MISCELLANEOUS) IMPLANT
SHEET MEDIUM DRAPE 40X70 STRL (DRAPES) IMPLANT
SLEEVE SCD COMPRESS KNEE MED (STOCKING) ×1 IMPLANT
SPIKE FLUID TRANSFER (MISCELLANEOUS) IMPLANT
SPONGE T-LAP 18X18 ~~LOC~~+RFID (SPONGE) ×1 IMPLANT
STAPLER SKIN PROX WIDE 3.9 (STAPLE) ×1 IMPLANT
STRIP CLOSURE SKIN 1/2X4 (GAUZE/BANDAGES/DRESSINGS) IMPLANT
SUT ETHILON 2 0 FS 18 (SUTURE) IMPLANT
SUT MNCRL AB 4-0 PS2 18 (SUTURE) ×1 IMPLANT
SUT PDS AB 2-0 CT2 27 (SUTURE) IMPLANT
SUT VIC AB 3-0 PS1 18XBRD (SUTURE) ×1 IMPLANT
SUT VIC AB 3-0 SH 27X BRD (SUTURE) IMPLANT
SUT VIC AB 4-0 PS2 18 (SUTURE) ×1 IMPLANT
SYR BULB IRRIG 60ML STRL (SYRINGE) ×1 IMPLANT
SYR CONTROL 10ML LL (SYRINGE) IMPLANT
TAPE MEASURE VINYL STERILE (MISCELLANEOUS) ×1 IMPLANT
TOWEL GREEN STERILE FF (TOWEL DISPOSABLE) ×2 IMPLANT
TUBE CONNECTING 20X1/4 (TUBING) ×1 IMPLANT
UNDERPAD 30X36 HEAVY ABSORB (UNDERPADS AND DIAPERS) ×2 IMPLANT
YANKAUER SUCT BULB TIP NO VENT (SUCTIONS) ×1 IMPLANT

## 2022-12-25 NOTE — Discharge Instructions (Addendum)
Post Anesthesia Home Care Instructions  Activity: Get plenty of rest for the remainder of the day. A responsible individual must stay with you for 24 hours following the procedure.  For the next 24 hours, DO NOT: -Drive a car -Advertising copywriter -Drink alcoholic beverages -Take any medication unless instructed by your physician -Make any legal decisions or sign important papers.  Meals: Start with liquid foods such as gelatin or soup. Progress to regular foods as tolerated. Avoid greasy, spicy, heavy foods. If nausea and/or vomiting occur, drink only clear liquids until the nausea and/or vomiting subsides. Call your physician if vomiting continues.  Special Instructions/Symptoms: Your throat may feel dry or sore from the anesthesia or the breathing tube placed in your throat during surgery. If this causes discomfort, gargle with warm salt water. The discomfort should disappear within 24 hours.  If you had a scopolamine patch placed behind your ear for the management of post- operative nausea and/or vomiting:  1. The medication in the patch is effective for 72 hours, after which it should be removed.  Wrap patch in a tissue and discard in the trash. Wash hands thoroughly with soap and water. 2. You may remove the patch earlier than 72 hours if you experience unpleasant side effects which may include dry mouth, dizziness or visual disturbances. 3. Avoid touching the patch. Wash your hands with soap and water after contact with the patch.    About my Jackson-Pratt Bulb Drain  What is a Jackson-Pratt bulb? A Jackson-Pratt is a soft, round device used to collect drainage. It is connected to a long, thin drainage catheter, which is held in place by one or two small stiches near your surgical incision site. When the bulb is squeezed, it forms a vacuum, forcing the drainage to empty into the bulb.  Emptying the Jackson-Pratt bulb- To empty the bulb: 1. Release the plug on the top of the  bulb. 2. Pour the bulb's contents into a measuring container which your nurse will provide. 3. Record the time emptied and amount of drainage. Empty the drain(s) as often as your     doctor or nurse recommends.  Date                  Time                    Amount (Drain 1)                 Amount (Drain 2)  _____________________________________________________________________  _____________________________________________________________________  _____________________________________________________________________  _____________________________________________________________________  _____________________________________________________________________  _____________________________________________________________________  _____________________________________________________________________  _____________________________________________________________________  Squeezing the Jackson-Pratt Bulb- To squeeze the bulb: 1. Make sure the plug at the top of the bulb is open. 2. Squeeze the bulb tightly in your fist. You will hear air squeezing from the bulb. 3. Replace the plug while the bulb is squeezed. 4. Use a safety pin to attach the bulb to your clothing. This will keep the catheter from     pulling at the bulb insertion site.  When to call your doctor- Call your doctor if: Drain site becomes red, swollen or hot. You have a fever greater than 101 degrees F. There is oozing at the drain site. Drain falls out (apply a guaze bandage over the drain hole and secure it with tape). Drainage increases daily not related to activity patterns. (You will usually have more drainage when you are active than when you are resting.) Drainage has a bad odor.  Next dose of  tylenol if needed is at 2pm

## 2022-12-25 NOTE — Anesthesia Procedure Notes (Signed)
Procedure Name: Intubation Date/Time: 12/25/2022 8:46 AM  Performed by: Allyn Kenner, CRNAPre-anesthesia Checklist: Patient identified, Emergency Drugs available, Suction available and Patient being monitored Patient Re-evaluated:Patient Re-evaluated prior to induction Oxygen Delivery Method: Circle System Utilized Preoxygenation: Pre-oxygenation with 100% oxygen Induction Type: IV induction Ventilation: Mask ventilation without difficulty Tube type: Oral Tube size: 7.0 mm Number of attempts: 1 Airway Equipment and Method: Stylet and Oral airway Placement Confirmation: ETT inserted through vocal cords under direct vision, positive ETCO2 and breath sounds checked- equal and bilateral Secured at: 21 cm Tube secured with: Tape Dental Injury: Teeth and Oropharynx as per pre-operative assessment

## 2022-12-25 NOTE — Anesthesia Postprocedure Evaluation (Signed)
Anesthesia Post Note  Patient: Christie Fox  Procedure(s) Performed: MASTOPEXY (Bilateral: Breast)     Patient location during evaluation: PACU Anesthesia Type: General Level of consciousness: awake Pain management: pain level controlled Vital Signs Assessment: post-procedure vital signs reviewed and stable Respiratory status: spontaneous breathing, nonlabored ventilation and respiratory function stable Cardiovascular status: blood pressure returned to baseline and stable Postop Assessment: no apparent nausea or vomiting Anesthetic complications: no   There were no known notable events for this encounter.  Last Vitals:  Vitals:   12/25/22 1215 12/25/22 1230  BP: 129/69 (!) 121/94  Pulse: 98 (!) 101  Resp: 14 20  Temp:    SpO2: 92% 93%    Last Pain:  Vitals:   12/25/22 1215  TempSrc:   PainSc: 4                  Linton Rump

## 2022-12-25 NOTE — Interval H&P Note (Signed)
Patient seen and examined. Plan bilateral breast mastopexy.

## 2022-12-25 NOTE — Transfer of Care (Signed)
Immediate Anesthesia Transfer of Care Note  Patient: Christie Fox  Procedure(s) Performed: MASTOPEXY (Bilateral: Breast)  Patient Location: PACU  Anesthesia Type:General  Level of Consciousness: awake, alert , and oriented  Airway & Oxygen Therapy: Patient Spontanous Breathing and Patient connected to face mask oxygen  Post-op Assessment: Report given to RN and Post -op Vital signs reviewed and stable  Post vital signs: Reviewed and stable  Last Vitals:  Vitals Value Taken Time  BP 121/60 12/25/22 1119  Temp    Pulse 97 12/25/22 1121  Resp 13 12/25/22 1121  SpO2 97 % 12/25/22 1121  Vitals shown include unfiled device data.  Last Pain:  Vitals:   12/25/22 0740  TempSrc: Temporal  PainSc: 6          Complications: No notable events documented.

## 2022-12-25 NOTE — Op Note (Signed)
Operative Note   DATE OF OPERATION: 11.25.2024  LOCATION: [facility]-outpatient  SURGICAL DIVISION: Plastic Surgery  PREOPERATIVE DIAGNOSES:  1. History breast cancer 2. History therapeutic radiation 3. Post operative asymmetry breasts  POSTOPERATIVE DIAGNOSES:  same  PROCEDURE:  Bilateral breast mastopexy  SURGEON: Glenna Fellows MD MBA  ASSISTANT: none  ANESTHESIA:  General.   EBL: 50 ml  COMPLICATIONS: None immediate.   INDICATIONS FOR PROCEDURE:  The patient, Christie Fox, is a 67 y.o. female born on Jun 14, 1955, is here for staged breast reconstruction. Patient has a history bilateral lumpectomy, right radiation and presents dissatisfied with breast volume, asymmetry. Plan mastopexy in preparation for augmentation.   FINDINGS: Right mastopexy 12 g Left mastopexy 63 g  DESCRIPTION OF PROCEDURE:  The patient was marked standing in the preoperative area to mark sternal notch, chest midline, anterior axillary lines, inframammary folds. The location of new nipple areolar complex was marked at level of on inframammary fold on anterior surface breast by palpation. This was marked symmetric over bilateral breasts. With aid of Wise pattern marker, location of new nipple areolar complex and vertical limbs (6 cm) were marked by displacement of breasts along meridian. The patient was taken to the operating room. SCDs were placed and IV antibiotics were given. The patient's operative site was prepped and draped in a sterile fashion. A time out was performed and all information was confirmed to be correct.     I began on left breast. Over left breast, superior pedicle marked and nipple areolar complex incised at its margin. Pedicle deepithlialized and developed to chest wall. Breast tissue resected over lower pole. Medial and lateral flaps developed. Breast tailor tacked closed.    I then directed attention to right breast where superior pedicle designed. NAC incised at areola  margin. The pedicle was deepithelialized. Pedicle developed to chest wall. Inferiorly based dermoglandular pedicle soft tissue maintained over lower pole to use as auto augmentation flap. This area was de epitheliazed and advanced superiorly and secured to chest wall beneath superior pedicle with interrupted 2-0 PDS suture. Medial and lateral flaps developed. Breast tailor tacked closed. Patient brought to  upright sitting position and assessed for symmetry. Patient returned to supine position. Breast cavities irrigated and hemostasis obtained. Local anesthetic infiltrated throughout each breast. 15 Fr JP placed in each breast and secured with 2-0 nylon. Closure completed bilateral with 3-0 vicryl to approximate dermis along inframammary fold and vertical limb. NAC inset with 3-0 vicryl in dermis. Skin closure completed with 4-0 monocryl subcuticular throughout. Tissue adhesive applied. Dry dressing and breast binder applied   The patient was allowed to wake from anesthesia, extubated and taken to the recovery room in satisfactory condition.   SPECIMENS: 15 Fr JP in right and left breast  DRAINS: 15 Fr JP in right and left breast  Glenna Fellows, MD [facility]  Office/ physician access line after hours [phone]

## 2022-12-26 ENCOUNTER — Encounter (HOSPITAL_BASED_OUTPATIENT_CLINIC_OR_DEPARTMENT_OTHER): Payer: Self-pay | Admitting: Plastic Surgery

## 2023-01-01 LAB — SURGICAL PATHOLOGY

## 2023-04-04 ENCOUNTER — Telehealth: Payer: Self-pay

## 2023-04-04 NOTE — Telephone Encounter (Signed)
 Returned pt phone call. Pt is having numbness in bilateral Ue's and pain in her neck. She requested her MD send a script to [place] for PT. She also told me that [employer] had called her and said they need an updated prescription for her traction machine that she purchased with a signature from me. She indicated they had reached out to get in touch with me multiple times, however, I have never been contacted.

## 2023-04-12 NOTE — Therapy (Signed)
 OUTPATIENT PHYSICAL THERAPY  UPPER EXTREMITY ONCOLOGY EVALUATION  Patient Name: Christie Fox MRN: [id number] DOB:09-24-1955, 68 y.o., female Today's Date: 04/13/2023  END OF SESSION:  PT End of Session - 04/13/23 1710     Visit Number 1    Number of Visits 18    Date for PT Re-Evaluation 05/25/23    PT Start Time 0903    PT Stop Time 1000    PT Time Calculation (min) 57 min    Activity Tolerance Patient tolerated treatment well    Behavior During Therapy [facility] for tasks assessed/performed             Past Medical History:  Diagnosis Date   Anxiety    Arthritis    knees   At risk for sleep apnea    STOP-BANG= 4    SENT TO PCP 07-31-2013   Borderline diabetes    diet controlled   Breast cancer (HCC)    Dysuria    History of kidney stones    Hyperlipidemia    Hypertension    Hypothyroidism    Migraines    PONV (postoperative nausea and vomiting)    "please call me [name] to wake me up , not [name]"    Renal calculus, left    Urgency of urination    Past Surgical History:  Procedure Laterality Date   BREAST LUMPECTOMY WITH RADIOACTIVE SEED LOCALIZATION Right 02/27/2022   Procedure: RIGHT BREAST LUMPECTOMY WITH RADIOACTIVE SEED LOCALIZATION X3;  Surgeon: Emelia Loron, MD;  Location: [facility];  Service: General;  Laterality: Right;   CESAREAN SECTION  x2   CYSTOSCOPY WITH RETROGRADE PYELOGRAM, URETEROSCOPY AND STENT PLACEMENT Left 08/04/2013   Procedure: 1ST STAGE CYSTOSCOPY WITH RETROGRADE PYELOGRAM, URETEROSCOPY AND STENT PLACEMENT;  Surgeon: Sebastian Ache, MD;  Location: [facility];  Service: Urology;  Laterality: Left;   CYSTOSCOPY WITH RETROGRADE PYELOGRAM, URETEROSCOPY AND STENT PLACEMENT Left 08/20/2013   Procedure: 2ND STAGE CYSTOSCOPY WITH RETROGRADE PYELOGRAM, URETEROSCOPY BASKET STONES AND STENT EXCHANGE, BLOOD MOP;  Surgeon: Sebastian Ache, MD;  Location: [facility];  Service: Urology;  Laterality: Left;   EXPLORATORY  LAPAROTOMY W/ BILATERAL SALPINOOPHORECTOMY  11-27-2001   HOLMIUM LASER APPLICATION Left 08/04/2013   Procedure: HOLMIUM LASER APPLICATION;  Surgeon: Sebastian Ache, MD;  Location: [facility];  Service: Urology;  Laterality: Left;   LAPAROSCOPIC GASTRIC SLEEVE RESECTION N/A 03/27/2017   Procedure: LAPAROSCOPIC GASTRIC SLEEVE RESECTION WITH UPPER ENDO;  Surgeon: Ovidio Kin, MD;  Location: [facility];  Service: General;  Laterality: N/A;   LEFT URETEROSCOPIC LASER LITHO STONE EXTRACTION AND STENT PLACEMENT  03-25-2005   LUMBAR FUSION  X2   LAST ONE 2000   MASTOPEXY Bilateral 12/25/2022   Procedure: MASTOPEXY;  Surgeon: Glenna Fellows, MD;  Location: [facility];  Service: Plastics;  Laterality: Bilateral;   ORIF LEFT ANKLE FX  03-20-2000   RETAINED HARDWARE   RADIOACTIVE SEED GUIDED EXCISIONAL BREAST BIOPSY Left 02/27/2022   Procedure: RADIOACTIVE SEED GUIDED EXCISIONAL LEFT BREAST BIOPSY;  Surgeon: Emelia Loron, MD;  Location: [facility];  Service: General;  Laterality: Left;   VAGINAL HYSTERECTOMY  1999   WISDOM TOOTH EXTRACTION     Patient Active Problem List   Diagnosis Date Noted   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) 12/30/2021   Hyperlipidemia 08/19/2014   Hypertension 08/19/2014   Hypothyroidism 08/19/2014   Primary osteoarthritis of both knees 08/19/2014      REFERRING PROVIDER: Mancel Parsons, MD  REFERRING DIAG: Chronic musculoskeletal Pain  THERAPY DIAG:  Chronic musculoskeletal pain  Malignant neoplasm of upper-outer quadrant of right breast in female, estrogen receptor positive (HCC)  Cervicalgia  Stiffness of right shoulder, not elsewhere classified  Stiffness of left shoulder, not elsewhere classified  ONSET DATE: January 2025  Rationale for Evaluation and Treatment: Rehabilitation  SUBJECTIVE:                                                                                                                                                                                            SUBJECTIVE STATEMENT:  Pain is all back in my bilateral UT with pain down both arms and into hands right greater than left. My Right posterior shoulder is killing me. I have knots everywhere. I have to run my hands under the water because they are numb, and I get needles in the tips of my fingers. I can't hold a pencil.  I am doing all of my exercises, and use my traction every other day and I can feel the feeling start coming back in my right hand. I get relief for the rest of the afternoon until I go to bed. Laying flat makes everything worse. I bought a husband pillow to sleep at night so my arms are on the arms rest and my head and back are elevated. I let hot water beat on me and that helps. They put me on 20 mg prednisone on Feb 28. I don't see a lot of difference with it. The muscle relaxers help at night, but I don't think the gabapentin does much. She has had some ablations of the genicular nerves in her knee that helped for several weeks, but the pain is back again. My right knuckles hurt. I had bilateral breast lifts 12/24/2022. No problems after that with ROM   PERTINENT HISTORY:  Pt with pain involving her neck and right shoulder, right arm, and right hand greater than left. Patient has a history significant for breast cancer with bilateral Lumpectomies; Right for DCIS, left with a Complex Sclerosing Lesion (Benign) and 0/1 incidental nodes. She had Adjuvant radiation on the right ending on 05/08/2022. Neck pain started 1-3 months after radiation.  Pt had bilateral breast lifts on 12/24/2022 and she has been doing her exercises since then.Neck pain is sharp, stabbing, aching and constant. Noted to have Diffuse DDD, Trace anterolisthesis at C3-C4 and C4-C5, and 2-3 mm anterolisthesis at C7-T1. Trace retrolisthesis at C6-C7,. Multilevel degenerative disc changes with disc space height loss most pronounced and moderate to  severe at C5-C6 and C6-C7,Multilevel uncovertebral spurring and facet arthropathy throughout cervical spine.  PAIN:  Are you having pain? Yes NPRS scale: 3/10 at best in last week to 9/10 worst Pain location: Greatest at right neck, arm and hand Pain orientation: Right  PAIN TYPE: aching, burning, sharp, throbbing, and tingling Pain description: constant  Aggravating factors: sleeping, not sure Relieving factors: traction, warm water running on me  PRECAUTIONS: Prior foot ,ankle (rods and pins) Left LE swelling,, back surgery, gastric sleeve, hysterectomy, pain in bilateral knees   RED FLAGS: Bowel or bladder incontinence: Yes:      WEIGHT BEARING RESTRICTIONS: No  FALLS:  Has patient fallen in last 6 months? 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  COGNITION: Overall cognitive status: Within functional limits for tasks assessed   PALPATION: Tender to palpation bilateral UT,levator, pectorals, posterior cervicals right greater than left  OBSERVATIONS / OTHER ASSESSMENTS: Mild redness dorsum of right hand, pt postures with scapular protraction, and UT elevation  SENSATION: Light touch: Deficits all fingers of right hand with decreased/altered sensation and fingertips of both hands    POSTURE: forward head, rounded shoulders, UT elevation  UPPER EXTREMITY AROM/PROM:  A/PROM RIGHT   eval   Shoulder extension 53, pain post shoulder   Shoulder flexion 132, pain top and under arm  Shoulder abduction 120, pain chest and under arm  Shoulder internal rotation 63  Shoulder external rotation 80, under arm, chest, shoulder    (Blank rows = not tested)  A/PROM LEFT   eval   Shoulder extension 55  Shoulder flexion 132  Shoulder abduction 132, pain under arm and top of arm  Shoulder internal rotation 64  Shoulder external rotation 85    (Blank rows = not tested)  CERVICAL AROM:     Percent limited  Flexion WNL, tight upper back/shoulders  Extension Dec 50%, increased pain neck, arms  Right lateral flexion Dec 20%, pain right neck/shoulder  Left lateral flexion Very tight/painful left UT  Right rotation Decreased 60%, pain right neck  Left rotation WFL, no pain    UPPER EXTREMITY STRENGTH: GRIP #2: Left 35,27              Right  8,5           Pincer grip Left  3,3               Right 2,1  LYMPHEDEMA ASSESSMENTS:   SURGERY TYPE/DATE:02/27/2022 Bilateral breast lumpectomies,  12/25/2022 Bilateral Mastopexy  NUMBER OF LYMPH NODES REMOVED: Left 0/1 incidental LN Right 0 LN's  CHEMOTHERAPY: NO  RADIATION:Adjuvant ended 05/08/2022   HORMONE TREATMENT: NO  INFECTIONS: NO    GRIP: left 35,27              right  8,5 Pincer grip  3,3                   Right 2,1   FUNCTIONAL TESTS:    GAIT: WNL    QUICK DASH SURVEY: 70%  TREATMENT DATE:  04/13/2023 Discussed importance of proper posture and using towel roll for sitting to maintain more erect posture. Encouraged to try scapular retractions to stretch chest muscles some and to use her traction every day for 10-15 min if she is getting good relief with it. Also reminded about cervical retractions and had her demonstrate. Discussed checking pain first and after several repetitions, and discontinuing if pain is worse. Discussed LOS, POC and treatment interventions.      PATIENT EDUCATION:  Education details: see above 04/13/2023 Person educated: Patient Education method: Explanation Education comprehension: verbalized understanding and returned demonstration  HOME  EXERCISE PROGRAM: Scapular retractions, cervical retractions reviewed with pt.  ASSESSMENT:  CLINICAL IMPRESSION: Patient is a 68 y.o. female who was seen today for physical therapy evaluation and treatment for complaints of bilateral neck, shoulder, arm pain right greater than left and right hand pain and tingling greater than left. She presents with limitations in bilateral shoulder ROM since she was last seen in November, and limitations in cervical ROM due to pain. Right Hand pain/numbness which had been alleviated has now returned. Her grip strength is very weak on the right with avg grip on right 6.5 lbs and left 31 lbs. She will benefit from skilled PT to address deficits and return to PLOF.    OBJECTIVE IMPAIRMENTS: decreased activity tolerance, decreased knowledge of condition, decreased ROM, decreased strength, impaired sensation, impaired UE functional use, postural dysfunction, and pain.   ACTIVITY LIMITATIONS: carrying, lifting, sleeping, bed mobility, dressing, and reach over head  PARTICIPATION LIMITATIONS: cleaning, laundry, driving, shopping, and community activity  PERSONAL FACTORS: 3+ comorbidities: Right breast cancer s/p radiation, chronic pain  are also affecting patient's functional outcome.   REHAB POTENTIAL: Good  CLINICAL DECISION MAKING: Evolving/moderate complexity  EVALUATION COMPLEXITY: Moderate  GOALS: Goals reviewed with patient? Yes  SHORT TERM GOALS: Target date: 05/04/2023  Pt will be independent with HEP for ROM and strengthening of neck and shoulder Baseline: Goal status: INITIAL  2.  Pt will report overall pain improved by 30% Baseline:  Goal status: INITIAL  3.  Bilateral shoulder flexion and abduction will be improved to atleast 140 degrees for improved reaching Baseline:  Goal status: INITIAL  4.  Pt will have quick dash improved by 20 points or more to demonstrate improved function Baseline: 70 Goal status: INITIAL  5.  Right grip  strength will improve to 15  lbs for improved ability to perform home activities Baseline:  Goal status: INITIAL   LONG TERM GOALS: Target date: 05/25/2023  Pt will have bilateral shoulder flexion and abd 150 degrees for improved reaching Baseline:  Goal status: INITIAL  2.  Pt will report overall pain 60% improved or better Baseline:  Goal status: INITIAL  3.  Pt will have right grip strength increased to 20 lbs or more for improved ability to open jars. Baseline:  Goal status: INITIAL  4.  Pt will have quick dash no greater than 20% to demonstrate improved function Baseline:  Goal status: INITIAL  5.  Pt will be able to sleep more comfortably without increased neck pain Baseline:  Goal status: INITIAL   PLAN:  PT FREQUENCY: 3x/week  PT DURATION: 6 weeks   PLANNED INTERVENTIONS: 97164- PT Re-evaluation, 97110-Therapeutic exercises, 97530- Therapeutic activity, 97112- Neuromuscular re-education, 97535- Self Care, 16109- Manual therapy, 97760- Orthotic Fit/training, and Dry Needling  PLAN FOR NEXT SESSION: STM to UT, levator, post. cervicals, scap region,manual traction, instruct supine clasped hands flex or supine wand for  flex, scaption, stargazer, wall slides, progress ROM as tolerated then strength, postural education,review home traction prn, DN as prior 1x/week  Waynette Buttery, PT 04/13/2023, 5:23 PM

## 2023-04-13 ENCOUNTER — Other Ambulatory Visit: Payer: Self-pay

## 2023-04-13 ENCOUNTER — Ambulatory Visit: Attending: Pain Medicine

## 2023-04-13 DIAGNOSIS — M542 Cervicalgia: Secondary | ICD-10-CM | POA: Insufficient documentation

## 2023-04-13 DIAGNOSIS — M25612 Stiffness of left shoulder, not elsewhere classified: Secondary | ICD-10-CM | POA: Diagnosis not present

## 2023-04-13 DIAGNOSIS — M7918 Myalgia, other site: Secondary | ICD-10-CM | POA: Diagnosis not present

## 2023-04-13 DIAGNOSIS — Z170 Estrogen receptor positive status [ER+]: Secondary | ICD-10-CM

## 2023-04-13 DIAGNOSIS — M25611 Stiffness of right shoulder, not elsewhere classified: Secondary | ICD-10-CM | POA: Insufficient documentation

## 2023-04-13 DIAGNOSIS — C50411 Malignant neoplasm of upper-outer quadrant of right female breast: Secondary | ICD-10-CM | POA: Diagnosis not present

## 2023-04-13 DIAGNOSIS — G8929 Other chronic pain: Secondary | ICD-10-CM | POA: Insufficient documentation

## 2023-04-17 ENCOUNTER — Ambulatory Visit

## 2023-04-17 DIAGNOSIS — M25612 Stiffness of left shoulder, not elsewhere classified: Secondary | ICD-10-CM

## 2023-04-17 DIAGNOSIS — M25611 Stiffness of right shoulder, not elsewhere classified: Secondary | ICD-10-CM

## 2023-04-17 DIAGNOSIS — G8929 Other chronic pain: Secondary | ICD-10-CM | POA: Diagnosis not present

## 2023-04-17 DIAGNOSIS — M7918 Myalgia, other site: Secondary | ICD-10-CM

## 2023-04-17 DIAGNOSIS — R293 Abnormal posture: Secondary | ICD-10-CM

## 2023-04-17 DIAGNOSIS — M542 Cervicalgia: Secondary | ICD-10-CM

## 2023-04-17 DIAGNOSIS — Z170 Estrogen receptor positive status [ER+]: Secondary | ICD-10-CM

## 2023-04-17 NOTE — Therapy (Signed)
 OUTPATIENT PHYSICAL THERAPY  TREATMENT  Patient Name: Christie Fox MRN: [id number] DOB:November 29, 1955, 68 y.o., female Today's Date: 04/17/2023  END OF SESSION:  PT End of Session - 04/17/23 0843     Visit Number 2    Date for PT Re-Evaluation 05/25/23    Authorization Type [employer]    Progress Note Due on Visit 10    PT Start Time 0802    PT Stop Time 0846    PT Time Calculation (min) 44 min    Activity Tolerance Patient tolerated treatment well    Behavior During Therapy [facility] for tasks assessed/performed              Past Medical History:  Diagnosis Date   Anxiety    Arthritis    knees   At risk for sleep apnea    STOP-BANG= 4    SENT TO PCP 07-31-2013   Borderline diabetes    diet controlled   Breast cancer (HCC)    Dysuria    History of kidney stones    Hyperlipidemia    Hypertension    Hypothyroidism    Migraines    PONV (postoperative nausea and vomiting)    "please call me Keyona to wake me up , not Mrs Hutchins"    Renal calculus, left    Urgency of urination    Past Surgical History:  Procedure Laterality Date   BREAST LUMPECTOMY WITH RADIOACTIVE SEED LOCALIZATION Right 02/27/2022   Procedure: RIGHT BREAST LUMPECTOMY WITH RADIOACTIVE SEED LOCALIZATION X3;  Surgeon: Emelia Loron, MD;  Location: [facility];  Service: General;  Laterality: Right;   CESAREAN SECTION  x2   CYSTOSCOPY WITH RETROGRADE PYELOGRAM, URETEROSCOPY AND STENT PLACEMENT Left 08/04/2013   Procedure: 1ST STAGE CYSTOSCOPY WITH RETROGRADE PYELOGRAM, URETEROSCOPY AND STENT PLACEMENT;  Surgeon: Sebastian Ache, MD;  Location: [facility];  Service: Urology;  Laterality: Left;   CYSTOSCOPY WITH RETROGRADE PYELOGRAM, URETEROSCOPY AND STENT PLACEMENT Left 08/20/2013   Procedure: 2ND STAGE CYSTOSCOPY WITH RETROGRADE PYELOGRAM, URETEROSCOPY BASKET STONES AND STENT EXCHANGE, BLOOD MOP;  Surgeon: Sebastian Ache, MD;  Location: [facility];  Service: Urology;  Laterality: Left;    EXPLORATORY LAPAROTOMY W/ BILATERAL SALPINOOPHORECTOMY  11-27-2001   HOLMIUM LASER APPLICATION Left 08/04/2013   Procedure: HOLMIUM LASER APPLICATION;  Surgeon: Sebastian Ache, MD;  Location: [facility];  Service: Urology;  Laterality: Left;   LAPAROSCOPIC GASTRIC SLEEVE RESECTION N/A 03/27/2017   Procedure: LAPAROSCOPIC GASTRIC SLEEVE RESECTION WITH UPPER ENDO;  Surgeon: Ovidio Kin, MD;  Location: [facility];  Service: General;  Laterality: N/A;   LEFT URETEROSCOPIC LASER LITHO STONE EXTRACTION AND STENT PLACEMENT  03-25-2005   LUMBAR FUSION  X2   LAST ONE 2000   MASTOPEXY Bilateral 12/25/2022   Procedure: MASTOPEXY;  Surgeon: Glenna Fellows, MD;  Location: [facility];  Service: Plastics;  Laterality: Bilateral;   ORIF LEFT ANKLE FX  03-20-2000   RETAINED HARDWARE   RADIOACTIVE SEED GUIDED EXCISIONAL BREAST BIOPSY Left 02/27/2022   Procedure: RADIOACTIVE SEED GUIDED EXCISIONAL LEFT BREAST BIOPSY;  Surgeon: Emelia Loron, MD;  Location: [facility];  Service: General;  Laterality: Left;   VAGINAL HYSTERECTOMY  1999   WISDOM TOOTH EXTRACTION     Patient Active Problem List   Diagnosis Date Noted   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) 12/30/2021   Hyperlipidemia 08/19/2014   Hypertension 08/19/2014   Hypothyroidism 08/19/2014   Primary osteoarthritis of both knees 08/19/2014  REFERRING PROVIDER: Mancel Parsons, MD  REFERRING DIAG: Chronic musculoskeletal Pain  THERAPY DIAG:  Malignant neoplasm of upper-outer quadrant of right breast in female, estrogen receptor positive (HCC)  Chronic musculoskeletal pain  Cervicalgia  Stiffness of right shoulder, not elsewhere classified  Stiffness of left shoulder, not elsewhere classified  Abnormal posture  ONSET DATE: January 2025  Rationale for Evaluation and Treatment: Rehabilitation  SUBJECTIVE:                                                                                                                                                                                            SUBJECTIVE STATEMENT:   I am using my traction unit x10 min each day.  I didn't sleep well last night.  We have a new puppy.  From evaluation: Pain is all back in my bilateral UT with pain down both arms and into hands right greater than left. My Right posterior shoulder is killing me. I have knots everywhere. I have to run my hands under the water because they are numb, and I get needles in the tips of my fingers. I can't hold a pencil.  I am doing all of my exercises, and use my traction every other day and I can feel the feeling start coming back in my right hand. I get relief for the rest of the afternoon until I go to bed. Laying flat makes everything worse. I bought a husband pillow to sleep at night so my arms are on the arms rest and my head and back are elevated. I let hot water beat on me and that helps. They put me on 20 mg prednisone on Feb 28. I don't see a lot of difference with it. The muscle relaxers help at night, but I don't think the gabapentin does much. She has had some ablations of the genicular nerves in her knee that helped for several weeks, but the pain is back again. My right knuckles hurt. I had bilateral breast lifts 12/24/2022. No problems after that with ROM   PERTINENT HISTORY:  Pt with pain involving her neck and right shoulder, right arm, and right hand greater than left. Patient has a history significant for breast cancer with bilateral Lumpectomies; Right for DCIS, left with a Complex Sclerosing Lesion (Benign) and 0/1 incidental nodes. She had Adjuvant radiation on the right ending on 05/08/2022. Neck pain started 1-3 months after radiation.  Pt had bilateral breast lifts on 12/24/2022 and she has been doing her exercises since then.Neck pain is sharp, stabbing, aching and constant. Noted to have Diffuse DDD, Trace anterolisthesis at C3-C4 and C4-C5, and 2-3 mm  anterolisthesis at C7-T1. Trace retrolisthesis at C6-C7,. Multilevel degenerative disc changes with disc space height loss most pronounced and moderate to severe at C5-C6 and C6-C7,Multilevel uncovertebral spurring and facet arthropathy throughout cervical spine.   PAIN: 04/17/23 Are you having pain? Yes NPRS scale: 9/10 at best in last week to 9/10 worst Pain location: Greatest at right neck, arm and hand Pain orientation: Right  PAIN TYPE: aching, burning, sharp, throbbing, and tingling Pain description: constant  Aggravating factors: sleeping, not sure Relieving factors: traction, warm water running on me  PRECAUTIONS: Prior foot ,ankle (rods and pins) Left LE swelling,, back surgery, gastric sleeve, hysterectomy, pain in bilateral knees   RED FLAGS: Bowel or bladder incontinence: Yes:      WEIGHT BEARING RESTRICTIONS: No  FALLS:  Has patient fallen in last 6 months? 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  COGNITION: Overall cognitive status: Within functional limits for tasks assessed   PALPATION: Tender to palpation bilateral UT,levator, pectorals, posterior cervicals right greater than left  OBSERVATIONS / OTHER ASSESSMENTS: Mild redness dorsum of right hand, pt postures with scapular protraction, and UT elevation  SENSATION: Light touch: Deficits all fingers of right hand with decreased/altered sensation and fingertips of both hands    POSTURE: forward head, rounded shoulders, UT elevation  UPPER EXTREMITY AROM/PROM:  A/PROM RIGHT   eval   Shoulder extension 53, pain post shoulder   Shoulder flexion 132, pain top and under arm  Shoulder abduction 120, pain  chest and under arm  Shoulder internal rotation 63  Shoulder external rotation 80, under arm, chest, shoulder    (Blank rows = not tested)  A/PROM LEFT   eval  Shoulder extension 55  Shoulder flexion 132  Shoulder abduction 132, pain under arm and top of arm  Shoulder internal rotation 64  Shoulder external rotation 85    (Blank rows = not tested)  CERVICAL AROM:     Percent limited  Flexion WNL, tight upper back/shoulders  Extension Dec 50%, increased pain neck, arms  Right lateral flexion Dec 20%, pain right neck/shoulder  Left lateral flexion Very tight/painful left UT  Right rotation Decreased 60%, pain right neck  Left rotation WFL, no pain    UPPER EXTREMITY STRENGTH: GRIP #2: Left 35,27              Right  8,5           Pincer grip Left  3,3               Right 2,1  LYMPHEDEMA ASSESSMENTS:   SURGERY TYPE/DATE:02/27/2022 Bilateral breast lumpectomies,  12/25/2022 Bilateral Mastopexy  NUMBER OF LYMPH NODES REMOVED: Left 0/1 incidental LN Right 0 LN's  CHEMOTHERAPY: NO  RADIATION:Adjuvant ended 05/08/2022   HORMONE TREATMENT: NO  INFECTIONS: NO    GRIP: left 35,27              right  8,5 Pincer grip  3,3                   Right 2,1   FUNCTIONAL TESTS:    GAIT: WNL    QUICK DASH SURVEY: 70%  TREATMENT DATE:  04/17/23 Discussion regarding exercises-cervical retraction, scap squeezes, levator/upper trap stretch Trigger Point Dry Needling  Initial Treatment: Pt instructed on Dry Needling rational, procedures, and possible side effects. Pt instructed to expect mild to moderate muscle soreness later in the day and/or into the next day.  Pt instructed in methods to reduce muscle soreness. Pt instructed to continue prescribed HEP. Because Dry Needling was performed over or adjacent to a lung field, pt was educated on S/S of pneumothorax  and to seek immediate medical attention should they occur.  Patient was educated on signs and symptoms of infection and other risk factors and advised to seek medical attention should they occur.  Patient verbalized understanding of these instructions and education.   Patient Verbal Consent Given: Yes Education Handout Provided: Yes Muscles Treated: Bil cervical multifidi, bil rhomboids, bil upper trap and levators  Electrical Stimulation Performed: No Treatment Response/Outcome: twitch and improved tissue mobility   Elongation and release after dry needling  04/13/2023 Discussed importance of proper posture and using towel roll for sitting to maintain more erect posture. Encouraged to try scapular retractions to stretch chest muscles some and to use her traction every day for 10-15 min if she is getting good relief with it. Also reminded about cervical retractions and had her demonstrate. Discussed checking pain first and after several repetitions, and discontinuing if pain is worse. Discussed LOS, POC and treatment interventions.      PATIENT EDUCATION:  Education details: see above 04/13/2023 Person educated: Patient Education method: Explanation Education comprehension: verbalized understanding and returned demonstration  HOME EXERCISE PROGRAM: Access Code: [email or web address] URL: [email or web address] Date: 04/17/2023 Prepared by: Tresa Endo  Exercises - Sternocleidomastoid Stretch  - 2 x daily - 7 x weekly - 1 sets - 3 reps - 30 hold - Gentle Levator Scapulae Stretch (Mirrored)  - 1 x daily - 7 x weekly - 1 sets - 3 reps - 30 hold - Seated Rhomboid Stretch  - 1 x daily - 7 x weekly - 1 sets - 3 reps - 30 hold - Radial Nerve Flossing (Mirrored)  - 1 x daily - 7 x weekly - 1 sets - 10 reps - Median Nerve Flossing  - 1 x daily - 7 x weekly - 1 sets - 10 reps  ASSESSMENT:  CLINICAL IMPRESSION: Patient is a 68 y.o. female who was seen today for physical therapy evaluation and  treatment for complaints of bilateral neck, shoulder, arm pain right greater than left and right hand pain and tingling greater than left. She presents with limitations in bilateral shoulder ROM since she was last seen in November, and limitations in cervical ROM due to pain. Right Hand pain/numbness which had been alleviated has now returned. Her grip strength is very weak on the right with avg grip on right 6.5 lbs and left 31 lbs. She will benefit from skilled PT to address deficits and return to PLOF.    OBJECTIVE IMPAIRMENTS: decreased activity tolerance, decreased knowledge of condition, decreased ROM, decreased strength, impaired sensation, impaired UE functional use, postural dysfunction, and pain.   ACTIVITY LIMITATIONS: carrying, lifting, sleeping, bed mobility, dressing, and reach over head  PARTICIPATION LIMITATIONS: cleaning, laundry, driving, shopping, and community activity  PERSONAL FACTORS: 3+ comorbidities: Right breast cancer s/p radiation, chronic pain  are also affecting patient's functional outcome.   REHAB POTENTIAL: Good  CLINICAL DECISION MAKING: Evolving/moderate complexity  EVALUATION COMPLEXITY: Moderate  GOALS: Goals reviewed with patient? Yes  SHORT TERM GOALS: Target date: 05/04/2023  Pt  will be independent with HEP for ROM and strengthening of neck and shoulder Baseline: Goal status: INITIAL  2.  Pt will report overall pain improved by 30% Baseline:  Goal status: INITIAL  3.  Bilateral shoulder flexion and abduction will be improved to atleast 140 degrees for improved reaching Baseline:  Goal status: INITIAL  4.  Pt will have quick dash improved by 20 points or more to demonstrate improved function Baseline: 70 Goal status: INITIAL  5.  Right grip strength will improve to 15  lbs for improved ability to perform home activities Baseline:  Goal status: INITIAL   LONG TERM GOALS: Target date: 05/25/2023  Pt will have bilateral shoulder flexion  and abd 150 degrees for improved reaching Baseline:  Goal status: INITIAL  2.  Pt will report overall pain 60% improved or better Baseline:  Goal status: INITIAL  3.  Pt will have right grip strength increased to 20 lbs or more for improved ability to open jars. Baseline:  Goal status: INITIAL  4.  Pt will have quick dash no greater than 20% to demonstrate improved function Baseline:  Goal status: INITIAL  5.  Pt will be able to sleep more comfortably without increased neck pain Baseline:  Goal status: INITIAL   PLAN:  PT FREQUENCY: 3x/week  PT DURATION: 6 weeks   PLANNED INTERVENTIONS: 97164- PT Re-evaluation, 97110-Therapeutic exercises, 97530- Therapeutic activity, 97112- Neuromuscular re-education, 97535- Self Care, [id number]- Manual therapy, 97760- Orthotic Fit/training, and Dry Needling  PLAN FOR NEXT SESSION: STM to UT, levator, post. cervicals, scap region,manual traction, instruct supine clasped hands flex or supine wand for flex, scaption, stargazer, wall slides, progress ROM as tolerated then strength, postural education,review home traction prn, DN as prior 1x/week  Fahd Galea, PT 04/17/2023, 8:45 AM

## 2023-04-17 NOTE — Patient Instructions (Signed)

## 2023-04-19 ENCOUNTER — Ambulatory Visit

## 2023-04-19 DIAGNOSIS — M542 Cervicalgia: Secondary | ICD-10-CM

## 2023-04-19 DIAGNOSIS — M7918 Myalgia, other site: Secondary | ICD-10-CM

## 2023-04-19 DIAGNOSIS — R293 Abnormal posture: Secondary | ICD-10-CM

## 2023-04-19 DIAGNOSIS — M25612 Stiffness of left shoulder, not elsewhere classified: Secondary | ICD-10-CM

## 2023-04-19 DIAGNOSIS — C50411 Malignant neoplasm of upper-outer quadrant of right female breast: Secondary | ICD-10-CM

## 2023-04-19 DIAGNOSIS — M25611 Stiffness of right shoulder, not elsewhere classified: Secondary | ICD-10-CM

## 2023-04-19 DIAGNOSIS — G8929 Other chronic pain: Secondary | ICD-10-CM | POA: Diagnosis not present

## 2023-04-19 NOTE — Therapy (Signed)
 OUTPATIENT PHYSICAL THERAPY  TREATMENT  Patient Name: Christie Fox MRN: [id number] DOB:[birth year], 68 y.o., female Today's Date: 04/19/2023  END OF SESSION:  PT End of Session - 04/19/23 0857     Visit Number 3    Number of Visits 18    Date for PT Re-Evaluation 05/25/23    Authorization Type [employer]    Progress Note Due on Visit 10    PT Start Time 0903    PT Stop Time 0952    PT Time Calculation (min) 49 min    Activity Tolerance Patient tolerated treatment well    Behavior During Therapy [facility] for tasks assessed/performed              Past Medical History:  Diagnosis Date   Anxiety    Arthritis    knees   At risk for sleep apnea    STOP-BANG= 4    SENT TO PCP 07-31-2013   Borderline diabetes    diet controlled   Breast cancer (HCC)    Dysuria    History of kidney stones    Hyperlipidemia    Hypertension    Hypothyroidism    Migraines    PONV (postoperative nausea and vomiting)    "please call me Christie Fox to wake me up , not Christie Fox"    Renal calculus, left    Urgency of urination    Past Surgical History:  Procedure Laterality Date   BREAST LUMPECTOMY WITH RADIOACTIVE SEED LOCALIZATION Right 02/27/2022   Procedure: RIGHT BREAST LUMPECTOMY WITH RADIOACTIVE SEED LOCALIZATION X3;  Surgeon: Emelia Loron, MD;  Location: [facility];  Service: General;  Laterality: Right;   CESAREAN SECTION  x2   CYSTOSCOPY WITH RETROGRADE PYELOGRAM, URETEROSCOPY AND STENT PLACEMENT Left 08/04/2013   Procedure: 1ST STAGE CYSTOSCOPY WITH RETROGRADE PYELOGRAM, URETEROSCOPY AND STENT PLACEMENT;  Surgeon: Sebastian Ache, MD;  Location: [facility];  Service: Urology;  Laterality: Left;   CYSTOSCOPY WITH RETROGRADE PYELOGRAM, URETEROSCOPY AND STENT PLACEMENT Left 08/20/2013   Procedure: 2ND STAGE CYSTOSCOPY WITH RETROGRADE PYELOGRAM, URETEROSCOPY BASKET STONES AND STENT EXCHANGE, BLOOD MOP;  Surgeon: Sebastian Ache, MD;  Location: [facility];  Service:  Urology;  Laterality: Left;   EXPLORATORY LAPAROTOMY W/ BILATERAL SALPINOOPHORECTOMY  11-27-2001   HOLMIUM LASER APPLICATION Left 08/04/2013   Procedure: HOLMIUM LASER APPLICATION;  Surgeon: Sebastian Ache, MD;  Location: [facility];  Service: Urology;  Laterality: Left;   LAPAROSCOPIC GASTRIC SLEEVE RESECTION N/A 03/27/2017   Procedure: LAPAROSCOPIC GASTRIC SLEEVE RESECTION WITH UPPER ENDO;  Surgeon: Ovidio Kin, MD;  Location: [facility];  Service: General;  Laterality: N/A;   LEFT URETEROSCOPIC LASER LITHO STONE EXTRACTION AND STENT PLACEMENT  03-25-2005   LUMBAR FUSION  X2   LAST ONE 2000   MASTOPEXY Bilateral 12/25/2022   Procedure: MASTOPEXY;  Surgeon: Glenna Fellows, MD;  Location: [facility];  Service: Plastics;  Laterality: Bilateral;   ORIF LEFT ANKLE FX  03-20-2000   RETAINED HARDWARE   RADIOACTIVE SEED GUIDED EXCISIONAL BREAST BIOPSY Left 02/27/2022   Procedure: RADIOACTIVE SEED GUIDED EXCISIONAL LEFT BREAST BIOPSY;  Surgeon: Emelia Loron, MD;  Location: [facility];  Service: General;  Laterality: Left;   VAGINAL HYSTERECTOMY  1999   WISDOM TOOTH EXTRACTION     Patient Active Problem List   Diagnosis Date Noted   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) 12/30/2021   Hyperlipidemia 08/19/2014   Hypertension 08/19/2014   Hypothyroidism 08/19/2014   Primary osteoarthritis of  both knees 08/19/2014      REFERRING PROVIDER: Mancel Parsons, MD  REFERRING DIAG: Chronic musculoskeletal Pain  THERAPY DIAG:  Malignant neoplasm of upper-outer quadrant of right breast in female, estrogen receptor positive (HCC)  Chronic musculoskeletal pain  Cervicalgia  Stiffness of right shoulder, not elsewhere classified  Stiffness of left shoulder, not elsewhere classified  Abnormal posture  ONSET DATE: January 2025  Rationale for Evaluation and Treatment: Rehabilitation  SUBJECTIVE:                                                                                                                                                                                            SUBJECTIVE STATEMENT:   I had my DN and it felt really good. My ROM is much better since then. I have been doing the stretches. I am doing my traction every day and my hands feel better afterwards for several hours  From evaluation: Pain is all back in my bilateral UT with pain down both arms and into hands right greater than left. My Right posterior shoulder is killing me. I have knots everywhere. I have to run my hands under the water because they are numb, and I get needles in the tips of my fingers. I can't hold a pencil.  I am doing all of my exercises, and use my traction every other day and I can feel the feeling start coming back in my right hand. I get relief for the rest of the afternoon until I go to bed. Laying flat makes everything worse. I bought a husband pillow to sleep at night so my arms are on the arms rest and my head and back are elevated. I let hot water beat on me and that helps. They put me on 20 mg prednisone on Feb 28. I don't see a lot of difference with it. The muscle relaxers help at night, but I don't think the gabapentin does much. She has had some ablations of the genicular nerves in her knee that helped for several weeks, but the pain is back again. My right knuckles hurt. I had bilateral breast lifts 12/24/2022. No problems after that with ROM   PERTINENT HISTORY:  Pt with pain involving her neck and right shoulder, right arm, and right hand greater than left. Patient has a history significant for breast cancer with bilateral Lumpectomies; Right for DCIS, left with a Complex Sclerosing Lesion (Benign) and 0/1 incidental nodes. She had Adjuvant radiation on the right ending on 05/08/2022. Neck pain started 1-3 months after radiation.  Pt had bilateral breast lifts on 12/24/2022 and she has been doing her exercises since  then.Neck pain is  sharp, stabbing, aching and constant. Noted to have Diffuse DDD, Trace anterolisthesis at C3-C4 and C4-C5, and 2-3 mm anterolisthesis at C7-T1. Trace retrolisthesis at C6-C7,. Multilevel degenerative disc changes with disc space height loss most pronounced and moderate to severe at C5-C6 and C6-C7,Multilevel uncovertebral spurring and facet arthropathy throughout cervical spine.   PAIN: 04/17/23 Are you having pain? Yes NPRS scale: 4-5/10 presently Pain location: Greatest at right neck, arm and hand Pain orientation: Right  PAIN TYPE: aching, burning, sharp, throbbing, and tingling Pain description: constant  Aggravating factors: sleeping, not sure Relieving factors: traction, warm water running on me  PRECAUTIONS: Prior foot ,ankle (rods and pins) Left LE swelling,, back surgery, gastric sleeve, hysterectomy, pain in bilateral knees   RED FLAGS: Bowel or bladder incontinence: Yes:      WEIGHT BEARING RESTRICTIONS: No  FALLS:  Has patient fallen in last 6 months? 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  COGNITION: Overall cognitive status: Within functional limits for tasks assessed   PALPATION: Tender to palpation bilateral UT,levator, pectorals, posterior cervicals right greater than left  OBSERVATIONS / OTHER ASSESSMENTS: Mild redness dorsum of right hand, pt postures with scapular protraction, and UT elevation  SENSATION: Light touch: Deficits all fingers of right hand with decreased/altered sensation and fingertips of both hands    POSTURE: forward head, rounded shoulders, UT elevation  UPPER EXTREMITY AROM/PROM:  A/PROM RIGHT   eval   Shoulder extension 53, pain  post shoulder   Shoulder flexion 132, pain top and under arm  Shoulder abduction 120, pain chest and under arm  Shoulder internal rotation 63  Shoulder external rotation 80, under arm, chest, shoulder    (Blank rows = not tested)  A/PROM LEFT   eval  Shoulder extension 55  Shoulder flexion 132  Shoulder abduction 132, pain under arm and top of arm  Shoulder internal rotation 64  Shoulder external rotation 85    (Blank rows = not tested)  CERVICAL AROM:     Percent limited  Flexion WNL, tight upper back/shoulders  Extension Dec 50%, increased pain neck, arms  Right lateral flexion Dec 20%, pain right neck/shoulder  Left lateral flexion Very tight/painful left UT  Right rotation Decreased 60%, pain right neck  Left rotation WFL, no pain    UPPER EXTREMITY STRENGTH: GRIP #2: Left 35,27              Right  8,5           Pincer grip Left  3,3               Right 2,1  LYMPHEDEMA ASSESSMENTS:   SURGERY TYPE/DATE:02/27/2022 Bilateral breast lumpectomies,  12/25/2022 Bilateral Mastopexy  NUMBER OF LYMPH NODES REMOVED: Left 0/1 incidental LN Right 0 LN's  CHEMOTHERAPY: NO  RADIATION:Adjuvant ended 05/08/2022   HORMONE TREATMENT: NO  INFECTIONS: NO    GRIP: left 35,27              right  8,5 Pincer grip  3,3                   Right 2,1   FUNCTIONAL TESTS:    GAIT: WNL    QUICK DASH SURVEY:  70%                                                                                                                            TREATMENT DATE:   04/19/2023 Cervical ROM Bilateral rot x 5 , SB x 5, Cervical retraction 2 x 5 all without increased pain and with  increased ROM STM to bilateral posterior cervicals, UT, levator, with cocoa butter, and in SL to bilateral scapular areas with cocoa butter Suboccipital release, manual traction with prolonged hold  04/17/23 Discussion regarding exercises-cervical retraction, scap squeezes, levator/upper trap stretch Trigger Point Dry  Needling  Initial Treatment: Pt instructed on Dry Needling rational, procedures, and possible side effects. Pt instructed to expect mild to moderate muscle soreness later in the day and/or into the next day.  Pt instructed in methods to reduce muscle soreness. Pt instructed to continue prescribed HEP. Because Dry Needling was performed over or adjacent to a lung field, pt was educated on S/S of pneumothorax and to seek immediate medical attention should they occur.  Patient was educated on signs and symptoms of infection and other risk factors and advised to seek medical attention should they occur.  Patient verbalized understanding of these instructions and education.   Patient Verbal Consent Given: Yes Education Handout Provided: Yes Muscles Treated: Bil cervical multifidi, bil rhomboids, bil upper trap and levators  Electrical Stimulation Performed: No Treatment Response/Outcome: twitch and improved tissue mobility   Elongation and release after dry needling  04/13/2023 Discussed importance of proper posture and using towel roll for sitting to maintain more erect posture. Encouraged to try scapular retractions to stretch chest muscles some and to use her traction every day for 10-15 min if she is getting good relief with it. Also reminded about cervical retractions and had her demonstrate. Discussed checking pain first and after several repetitions, and discontinuing if pain is worse. Discussed LOS, POC and treatment interventions.      PATIENT EDUCATION:  Education details: see above 04/13/2023 Person educated: Patient Education method: Explanation Education comprehension: verbalized understanding and returned demonstration  HOME EXERCISE PROGRAM: Access Code: [email or web address] URL: [email or web address] Date: 04/17/2023 Prepared by: Tresa Endo  Exercises - Sternocleidomastoid Stretch  - 2 x daily - 7 x weekly - 1 sets - 3 reps - 30 hold - Gentle Levator Scapulae Stretch  (Mirrored)  - 1 x daily - 7 x weekly - 1 sets - 3 reps - 30 hold - Seated Rhomboid Stretch  - 1 x daily - 7 x weekly - 1 sets - 3 reps - 30 hold - Radial Nerve Flossing (Mirrored)  - 1 x daily - 7 x weekly - 1 sets - 10 reps - Median Nerve Flossing  - 1 x daily - 7 x weekly - 1 sets - 10 reps  ASSESSMENT:  CLINICAL IMPRESSION: Pt got excellent results from DN and got great results from manual work today. Multiple shortened areas throughout neck and bilateral scapular areas. Left hand  pain/numbness gone after treatment and right hand pain/numbness 80 % better after treatment today. She is compliant with HEP  OBJECTIVE IMPAIRMENTS: decreased activity tolerance, decreased knowledge of condition, decreased ROM, decreased strength, impaired sensation, impaired UE functional use, postural dysfunction, and pain.   ACTIVITY LIMITATIONS: carrying, lifting, sleeping, bed mobility, dressing, and reach over head  PARTICIPATION LIMITATIONS: cleaning, laundry, driving, shopping, and community activity  PERSONAL FACTORS: 3+ comorbidities: Right breast cancer s/p radiation, chronic pain  are also affecting patient's functional outcome.   REHAB POTENTIAL: Good  CLINICAL DECISION MAKING: Evolving/moderate complexity  EVALUATION COMPLEXITY: Moderate  GOALS: Goals reviewed with patient? Yes  SHORT TERM GOALS: Target date: 05/04/2023  Pt will be independent with HEP for ROM and strengthening of neck and shoulder Baseline: Goal status: INITIAL  2.  Pt will report overall pain improved by 30% Baseline:  Goal status: INITIAL  3.  Bilateral shoulder flexion and abduction will be improved to atleast 140 degrees for improved reaching Baseline:  Goal status: INITIAL  4.  Pt will have quick dash improved by 20 points or more to demonstrate improved function Baseline: 70 Goal status: INITIAL  5.  Right grip strength will improve to 15  lbs for improved ability to perform home activities Baseline:   Goal status: INITIAL   LONG TERM GOALS: Target date: 05/25/2023  Pt will have bilateral shoulder flexion and abd 150 degrees for improved reaching Baseline:  Goal status: INITIAL  2.  Pt will report overall pain 60% improved or better Baseline:  Goal status: INITIAL  3.  Pt will have right grip strength increased to 20 lbs or more for improved ability to open jars. Baseline:  Goal status: INITIAL  4.  Pt will have quick dash no greater than 20% to demonstrate improved function Baseline:  Goal status: INITIAL  5.  Pt will be able to sleep more comfortably without increased neck pain Baseline:  Goal status: INITIAL   PLAN:  PT FREQUENCY: 3x/week  PT DURATION: 6 weeks   PLANNED INTERVENTIONS: 97164- PT Re-evaluation, 97110-Therapeutic exercises, 97530- Therapeutic activity, 97112- Neuromuscular re-education, 97535- Self Care, [id number]- Manual therapy, 97760- Orthotic Fit/training, and Dry Needling  PLAN FOR NEXT SESSION: STM to UT, levator, post. cervicals, scap region,manual traction, instruct supine clasped hands flex or supine wand for flex, scaption, stargazer, wall slides, progress ROM as tolerated then strength, postural education,review home traction prn, DN as prior 1x/week  Waynette Buttery, PT 04/19/2023, 9:57 AM

## 2023-04-20 ENCOUNTER — Ambulatory Visit

## 2023-04-20 DIAGNOSIS — M542 Cervicalgia: Secondary | ICD-10-CM

## 2023-04-20 DIAGNOSIS — G8929 Other chronic pain: Secondary | ICD-10-CM | POA: Diagnosis not present

## 2023-04-20 DIAGNOSIS — M25611 Stiffness of right shoulder, not elsewhere classified: Secondary | ICD-10-CM

## 2023-04-20 DIAGNOSIS — M25612 Stiffness of left shoulder, not elsewhere classified: Secondary | ICD-10-CM

## 2023-04-20 DIAGNOSIS — R293 Abnormal posture: Secondary | ICD-10-CM

## 2023-04-20 DIAGNOSIS — C50411 Malignant neoplasm of upper-outer quadrant of right female breast: Secondary | ICD-10-CM

## 2023-04-20 NOTE — Therapy (Signed)
 OUTPATIENT PHYSICAL THERAPY  TREATMENT  Patient Name: Christie Fox MRN: [id number] DOB:10/16/1955, 68 y.o., female Today's Date: 04/20/2023  END OF SESSION:  PT End of Session - 04/20/23 0953     Visit Number 4    Number of Visits 18    Date for PT Re-Evaluation 05/25/23    Authorization Type [employer]    Authorization Time Period 04/13/2023-05/25/2023    Authorization - Visit Number 4    Authorization - Number of Visits 18    Progress Note Due on Visit 10    PT Start Time 1000    PT Stop Time 1100    PT Time Calculation (min) 60 min    Activity Tolerance Patient tolerated treatment well    Behavior During Therapy WFL for tasks assessed/performed              Past Medical History:  Diagnosis Date   Anxiety    Arthritis    knees   At risk for sleep apnea    STOP-BANG= 4    SENT TO PCP 07-31-2013   Borderline diabetes    diet controlled   Breast cancer (HCC)    Dysuria    History of kidney stones    Hyperlipidemia    Hypertension    Hypothyroidism    Migraines    PONV (postoperative nausea and vomiting)    "please call me [name] to wake me up , not Mrs Coffie"    Renal calculus, left    Urgency of urination    Past Surgical History:  Procedure Laterality Date   BREAST LUMPECTOMY WITH RADIOACTIVE SEED LOCALIZATION Right 02/27/2022   Procedure: RIGHT BREAST LUMPECTOMY WITH RADIOACTIVE SEED LOCALIZATION X3;  Surgeon: Emelia Loron, MD;  Location: [facility];  Service: General;  Laterality: Right;   CESAREAN SECTION  x2   CYSTOSCOPY WITH RETROGRADE PYELOGRAM, URETEROSCOPY AND STENT PLACEMENT Left 08/04/2013   Procedure: 1ST STAGE CYSTOSCOPY WITH RETROGRADE PYELOGRAM, URETEROSCOPY AND STENT PLACEMENT;  Surgeon: Sebastian Ache, MD;  Location: [facility];  Service: Urology;  Laterality: Left;   CYSTOSCOPY WITH RETROGRADE PYELOGRAM, URETEROSCOPY AND STENT PLACEMENT Left 08/20/2013   Procedure: 2ND STAGE CYSTOSCOPY WITH RETROGRADE PYELOGRAM, URETEROSCOPY BASKET  STONES AND STENT EXCHANGE, BLOOD MOP;  Surgeon: Sebastian Ache, MD;  Location: [facility];  Service: Urology;  Laterality: Left;   EXPLORATORY LAPAROTOMY W/ BILATERAL SALPINOOPHORECTOMY  11-27-2001   HOLMIUM LASER APPLICATION Left 08/04/2013   Procedure: HOLMIUM LASER APPLICATION;  Surgeon: Sebastian Ache, MD;  Location: [facility];  Service: Urology;  Laterality: Left;   LAPAROSCOPIC GASTRIC SLEEVE RESECTION N/A 03/27/2017   Procedure: LAPAROSCOPIC GASTRIC SLEEVE RESECTION WITH UPPER ENDO;  Surgeon: Ovidio Kin, MD;  Location: [facility];  Service: General;  Laterality: N/A;   LEFT URETEROSCOPIC LASER LITHO STONE EXTRACTION AND STENT PLACEMENT  03-25-2005   LUMBAR FUSION  X2   LAST ONE 2000   MASTOPEXY Bilateral 12/25/2022   Procedure: MASTOPEXY;  Surgeon: Glenna Fellows, MD;  Location: [facility];  Service: Plastics;  Laterality: Bilateral;   ORIF LEFT ANKLE FX  03-20-2000   RETAINED HARDWARE   RADIOACTIVE SEED GUIDED EXCISIONAL BREAST BIOPSY Left 02/27/2022   Procedure: RADIOACTIVE SEED GUIDED EXCISIONAL LEFT BREAST BIOPSY;  Surgeon: Emelia Loron, MD;  Location: [facility];  Service: General;  Laterality: Left;   VAGINAL HYSTERECTOMY  1999   WISDOM TOOTH EXTRACTION     Patient Active Problem List   Diagnosis Date Noted   Malignant neoplasm of upper  outer 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) 12/30/2021   Hyperlipidemia 08/19/2014   Hypertension 08/19/2014   Hypothyroidism 08/19/2014   Primary osteoarthritis of both knees 08/19/2014      REFERRING PROVIDER: Mancel Parsons, MD  REFERRING DIAG: Chronic musculoskeletal Pain  THERAPY DIAG:  Malignant neoplasm of upper-outer quadrant of right breast in female, estrogen receptor positive (HCC)  Chronic musculoskeletal pain  Cervicalgia  Stiffness of right shoulder, not elsewhere classified  Stiffness of left shoulder, not elsewhere classified  Abnormal posture  ONSET DATE: January  2025  Rationale for Evaluation and Treatment: Rehabilitation  SUBJECTIVE:                                                                                                                                                                                           SUBJECTIVE STATEMENT:   04/20/2023 I did well after yesterday. I am a little sore but I did my exercises yesterday and my traction already today. I am doing some stretches at home   From evaluation: Pain is all back in my bilateral UT with pain down both arms and into hands right greater than left. My Right posterior shoulder is killing me. I have knots everywhere. I have to run my hands under the water because they are numb, and I get needles in the tips of my fingers. I can't hold a pencil.  I am doing all of my exercises, and use my traction every other day and I can feel the feeling start coming back in my right hand. I get relief for the rest of the afternoon until I go to bed. Laying flat makes everything worse. I bought a husband pillow to sleep at night so my arms are on the arms rest and my head and back are elevated. I let hot water beat on me and that helps. They put me on 20 mg prednisone on Feb 28. I don't see a lot of difference with it. The muscle relaxers help at night, but I don't think the gabapentin does much. She has had some ablations of the genicular nerves in her knee that helped for several weeks, but the pain is back again. My right knuckles hurt. I had bilateral breast lifts 12/24/2022. No problems after that with ROM   PERTINENT HISTORY:  Pt with pain involving her neck and right shoulder, right arm, and right hand greater than left. Patient has a history significant for breast cancer with bilateral Lumpectomies; Right for DCIS, left with a Complex Sclerosing Lesion (Benign) and 0/1 incidental nodes. She had Adjuvant radiation on the right ending on 05/08/2022. Neck pain started 1-3 months after radiation.  Pt had  bilateral breast lifts on 12/24/2022 and she has been doing her exercises since then.Neck pain is sharp, stabbing, aching and constant. Noted to have Diffuse DDD, Trace anterolisthesis at C3-C4 and C4-C5, and 2-3 mm anterolisthesis at C7-T1. Trace retrolisthesis at C6-C7,. Multilevel degenerative disc changes with disc space height loss most pronounced and moderate to severe at C5-C6 and C6-C7,Multilevel uncovertebral spurring and facet arthropathy throughout cervical spine.   PAIN: 04/17/23 Are you having pain? Yes NPRS scale: 3/10 presently Pain location: Greatest at right neck, arm and hand Pain orientation: Right  PAIN TYPE: aching, burning, sharp, throbbing, and tingling Pain description: constant  Aggravating factors: sleeping, not sure Relieving factors: traction, warm water running on me  PRECAUTIONS: Prior foot ,ankle (rods and pins) Left LE swelling,, back surgery, gastric sleeve, hysterectomy, pain in bilateral knees   RED FLAGS: Bowel or bladder incontinence: Yes:      WEIGHT BEARING RESTRICTIONS: No  FALLS:  Has patient fallen in last 6 months? 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  COGNITION: Overall cognitive status: Within functional limits for tasks assessed   PALPATION: Tender to palpation bilateral UT,levator, pectorals, posterior cervicals right greater than left  OBSERVATIONS / OTHER ASSESSMENTS: Mild redness dorsum of right hand, pt postures with scapular protraction, and UT elevation  SENSATION: Light touch: Deficits all fingers of right hand with decreased/altered sensation and fingertips of both hands    POSTURE: forward head, rounded shoulders, UT  elevation  UPPER EXTREMITY AROM/PROM:  A/PROM RIGHT   eval  RIGHT 04/20/2023  Shoulder extension 53, pain post shoulder    Shoulder flexion 132, pain top and under arm 160  Shoulder abduction 120, pain chest and under arm 168  Shoulder internal rotation 63   Shoulder external rotation 80, under arm, chest, shoulder     (Blank rows = not tested)  A/PROM LEFT   eval LEFT 04/20/2023  Shoulder extension 55   Shoulder flexion 132 163  Shoulder abduction 132, pain under arm and top of arm 164  Shoulder internal rotation 64   Shoulder external rotation 85     (Blank rows = not tested)  CERVICAL AROM:     Percent limited  Flexion WNL, tight upper back/shoulders  Extension Dec 50%, increased pain neck, arms  Right lateral flexion Dec 20%, pain right neck/shoulder  Left lateral flexion Very tight/painful left UT  Right rotation Decreased 60%, pain right neck  Left rotation WFL, no pain    UPPER EXTREMITY STRENGTH: GRIP #2: Left 35,27              Right  8,5           Pincer grip Left  3,3               Right 2,1  LYMPHEDEMA ASSESSMENTS:   SURGERY TYPE/DATE:02/27/2022 Bilateral breast lumpectomies,  12/25/2022 Bilateral Mastopexy  NUMBER OF LYMPH NODES REMOVED: Left 0/1 incidental LN Right 0 LN's  CHEMOTHERAPY: NO  RADIATION:Adjuvant ended 05/08/2022   HORMONE TREATMENT: NO  INFECTIONS: NO    GRIP: left 35,27              right  8,5 Pincer grip  3,3  Right 2,1   FUNCTIONAL TESTS:    GAIT: WNL    QUICK DASH SURVEY: 70%                                                                                                                            TREATMENT DATE:   04/20/2023 Reviewed wall slides x 5, stargazer x 5 and supine shoulder flexion for pt to continue doing at home Bilateral pectoral stretches in doorway x 3 ea Manual traction, sub occipital release STM to bilateral posterior cervicals, UT, levator, and in SL to bilateral scapular areas  with cocoa butter Suboccipital release, manual traction with prolonged hold Supine cervical retractions into therapists hands x 10 Measured AROM bilateral shoulders 04/19/2023 Cervical ROM Bilateral rot x 5 , SB x 5, Cervical retraction 2 x 5 all without increased pain and with  increased ROM STM to bilateral posterior cervicals, UT, levator, with cocoa butter, and in SL to bilateral scapular areas with cocoa butter Suboccipital release, manual traction with prolonged hold  04/17/23 Discussion regarding exercises-cervical retraction, scap squeezes, levator/upper trap stretch Trigger Point Dry Needling  Initial Treatment: Pt instructed on Dry Needling rational, procedures, and possible side effects. Pt instructed to expect mild to moderate muscle soreness later in the day and/or into the next day.  Pt instructed in methods to reduce muscle soreness. Pt instructed to continue prescribed HEP. Because Dry Needling was performed over or adjacent to a lung field, pt was educated on S/S of pneumothorax and to seek immediate medical attention should they occur.  Patient was educated on signs and symptoms of infection and other risk factors and advised to seek medical attention should they occur.  Patient verbalized understanding of these instructions and education.   Patient Verbal Consent Given: Yes Education Handout Provided: Yes Muscles Treated: Bil cervical multifidi, bil rhomboids, bil upper trap and levators  Electrical Stimulation Performed: No Treatment Response/Outcome: twitch and improved tissue mobility   Elongation and release after dry needling  04/13/2023 Discussed importance of proper posture and using towel roll for sitting to maintain more erect posture. Encouraged to try scapular retractions to stretch chest muscles some and to use her traction every day for 10-15 min if she is getting good relief with it. Also reminded about cervical retractions and had her demonstrate. Discussed  checking pain first and after several repetitions, and discontinuing if pain is worse. Discussed LOS, POC and treatment interventions.      PATIENT EDUCATION:  Education details: see above 04/13/2023 Person educated: Patient Education method: Explanation Education comprehension: verbalized understanding and returned demonstration  HOME EXERCISE PROGRAM: Access Code: [email or web address] URL: [email or web address] Date: 04/17/2023 Prepared by: Tresa Endo  Exercises - Sternocleidomastoid Stretch  - 2 x daily - 7 x weekly - 1 sets - 3 reps - 30 hold - Gentle Levator Scapulae Stretch (Mirrored)  - 1 x daily - 7 x weekly - 1 sets - 3 reps - 30 hold - Seated Rhomboid Stretch  -  1 x daily - 7 x weekly - 1 sets - 3 reps - 30 hold - Radial Nerve Flossing (Mirrored)  - 1 x daily - 7 x weekly - 1 sets - 10 reps - Median Nerve Flossing  - 1 x daily - 7 x weekly - 1 sets - 10 reps  ASSESSMENT:  CLINICAL IMPRESSION:  Pts pain level has improved significantly and as a result her bilateral shoulder AROM has improved significantly. She continues to have shortened areas throughout Cervical muscles and scapular regions but significantly better.  OBJECTIVE IMPAIRMENTS: decreased activity tolerance, decreased knowledge of condition, decreased ROM, decreased strength, impaired sensation, impaired UE functional use, postural dysfunction, and pain.   ACTIVITY LIMITATIONS: carrying, lifting, sleeping, bed mobility, dressing, and reach over head  PARTICIPATION LIMITATIONS: cleaning, laundry, driving, shopping, and community activity  PERSONAL FACTORS: 3+ comorbidities: Right breast cancer s/p radiation, chronic pain  are also affecting patient's functional outcome.   REHAB POTENTIAL: Good  CLINICAL DECISION MAKING: Evolving/moderate complexity  EVALUATION COMPLEXITY: Moderate  GOALS: Goals reviewed with patient? Yes  SHORT TERM GOALS: Target date: 05/04/2023  Pt will be independent with HEP for  ROM and strengthening of neck and shoulder Baseline: Goal status: INITIAL  2.  Pt will report overall pain improved by 30% Baseline:  Goal status: INITIAL  3.  Bilateral shoulder flexion and abduction will be improved to atleast 140 degrees for improved reaching Baseline:  Goal status: INITIAL  4.  Pt will have quick dash improved by 20 points or more to demonstrate improved function Baseline: 70 Goal status: INITIAL  5.  Right grip strength will improve to 15  lbs for improved ability to perform home activities Baseline:  Goal status: INITIAL   LONG TERM GOALS: Target date: 05/25/2023  Pt will have bilateral shoulder flexion and abd 150 degrees for improved reaching Baseline:  Goal status: INITIAL  2.  Pt will report overall pain 60% improved or better Baseline:  Goal status: INITIAL  3.  Pt will have right grip strength increased to 20 lbs or more for improved ability to open jars. Baseline:  Goal status: INITIAL  4.  Pt will have quick dash no greater than 20% to demonstrate improved function Baseline:  Goal status: INITIAL  5.  Pt will be able to sleep more comfortably without increased neck pain Baseline:  Goal status: INITIAL   PLAN:  PT FREQUENCY: 3x/week  PT DURATION: 6 weeks   PLANNED INTERVENTIONS: 97164- PT Re-evaluation, 97110-Therapeutic exercises, 97530- Therapeutic activity, 97112- Neuromuscular re-education, 97535- Self Care, 17616- Manual therapy, 97760- Orthotic Fit/training, and Dry Needling  PLAN FOR NEXT SESSION: STM to UT, levator, post. cervicals, scap region,manual traction, instruct supine clasped hands flex or supine wand for flex, scaption, stargazer, wall slides, progress ROM as tolerated then strength, postural education,review home traction prn, DN as prior 1x/week  Waynette Buttery, PT 04/20/2023, 12:37 PM

## 2023-04-23 ENCOUNTER — Ambulatory Visit

## 2023-04-23 DIAGNOSIS — M25612 Stiffness of left shoulder, not elsewhere classified: Secondary | ICD-10-CM

## 2023-04-23 DIAGNOSIS — M542 Cervicalgia: Secondary | ICD-10-CM

## 2023-04-23 DIAGNOSIS — G8929 Other chronic pain: Secondary | ICD-10-CM | POA: Diagnosis not present

## 2023-04-23 DIAGNOSIS — C50411 Malignant neoplasm of upper-outer quadrant of right female breast: Secondary | ICD-10-CM

## 2023-04-23 DIAGNOSIS — M25611 Stiffness of right shoulder, not elsewhere classified: Secondary | ICD-10-CM

## 2023-04-23 NOTE — Therapy (Signed)
 OUTPATIENT PHYSICAL THERAPY  TREATMENT  Patient Name: Christie Fox MRN: [id number] DOB:1955-07-18, 68 y.o., female Today's Date: 04/23/2023  END OF SESSION:  PT End of Session - 04/23/23 0751     Visit Number 5    Number of Visits 18    Date for PT Re-Evaluation 05/25/23    Authorization Type [employer]    Authorization Time Period 04/13/2023-05/25/2023    Authorization - Visit Number 5    Authorization - Number of Visits 18    Progress Note Due on Visit 10    PT Start Time 0800    PT Stop Time 0853    PT Time Calculation (min) 53 min    Activity Tolerance Patient tolerated treatment well    Behavior During Therapy WFL for tasks assessed/performed              Past Medical History:  Diagnosis Date   Anxiety    Arthritis    knees   At risk for sleep apnea    STOP-BANG= 4    SENT TO PCP 07-31-2013   Borderline diabetes    diet controlled   Breast cancer (HCC)    Dysuria    History of kidney stones    Hyperlipidemia    Hypertension    Hypothyroidism    Migraines    PONV (postoperative nausea and vomiting)    "please call me Christie Fox to wake me up , not Mrs Osgood"    Renal calculus, left    Urgency of urination    Past Surgical History:  Procedure Laterality Date   BREAST LUMPECTOMY WITH RADIOACTIVE SEED LOCALIZATION Right 02/27/2022   Procedure: RIGHT BREAST LUMPECTOMY WITH RADIOACTIVE SEED LOCALIZATION X3;  Surgeon: Emelia Loron, MD;  Location: [facility];  Service: General;  Laterality: Right;   CESAREAN SECTION  x2   CYSTOSCOPY WITH RETROGRADE PYELOGRAM, URETEROSCOPY AND STENT PLACEMENT Left 08/04/2013   Procedure: 1ST STAGE CYSTOSCOPY WITH RETROGRADE PYELOGRAM, URETEROSCOPY AND STENT PLACEMENT;  Surgeon: Sebastian Ache, MD;  Location: [facility];  Service: Urology;  Laterality: Left;   CYSTOSCOPY WITH RETROGRADE PYELOGRAM, URETEROSCOPY AND STENT PLACEMENT Left 08/20/2013   Procedure: 2ND STAGE CYSTOSCOPY WITH RETROGRADE PYELOGRAM, URETEROSCOPY BASKET  STONES AND STENT EXCHANGE, BLOOD MOP;  Surgeon: Sebastian Ache, MD;  Location: [facility];  Service: Urology;  Laterality: Left;   EXPLORATORY LAPAROTOMY W/ BILATERAL SALPINOOPHORECTOMY  11-27-2001   HOLMIUM LASER APPLICATION Left 08/04/2013   Procedure: HOLMIUM LASER APPLICATION;  Surgeon: Sebastian Ache, MD;  Location: [facility];  Service: Urology;  Laterality: Left;   LAPAROSCOPIC GASTRIC SLEEVE RESECTION N/A 03/27/2017   Procedure: LAPAROSCOPIC GASTRIC SLEEVE RESECTION WITH UPPER ENDO;  Surgeon: Ovidio Kin, MD;  Location: [facility];  Service: General;  Laterality: N/A;   LEFT URETEROSCOPIC LASER LITHO STONE EXTRACTION AND STENT PLACEMENT  03-25-2005   LUMBAR FUSION  X2   LAST ONE 2000   MASTOPEXY Bilateral 12/25/2022   Procedure: MASTOPEXY;  Surgeon: Glenna Fellows, MD;  Location: [facility];  Service: Plastics;  Laterality: Bilateral;   ORIF LEFT ANKLE FX  03-20-2000   RETAINED HARDWARE   RADIOACTIVE SEED GUIDED EXCISIONAL BREAST BIOPSY Left 02/27/2022   Procedure: RADIOACTIVE SEED GUIDED EXCISIONAL LEFT BREAST BIOPSY;  Surgeon: Emelia Loron, MD;  Location: [facility];  Service: General;  Laterality: Left;   VAGINAL HYSTERECTOMY  1999   WISDOM TOOTH EXTRACTION     Patient Active Problem List   Diagnosis Date Noted   Malignant neoplasm of upper  outer 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) 12/30/2021   Hyperlipidemia 08/19/2014   Hypertension 08/19/2014   Hypothyroidism 08/19/2014   Primary osteoarthritis of both knees 08/19/2014      REFERRING PROVIDER: Mancel Parsons, MD  REFERRING DIAG: Chronic musculoskeletal Pain  THERAPY DIAG:  Malignant neoplasm of upper-outer quadrant of right breast in female, estrogen receptor positive (HCC)  Chronic musculoskeletal pain  Cervicalgia  Stiffness of right shoulder, not elsewhere classified  Stiffness of left shoulder, not elsewhere classified  ONSET DATE: January 2025  Rationale for  Evaluation and Treatment: Rehabilitation  SUBJECTIVE:                                                                                                                                                                                           SUBJECTIVE STATEMENT:   04/23/2023 I felt good over the weekend. The only thing that bothered me is my knees. I can't stand a lot. I am a little stiff this am. I used my traction today because my hands were bothering me. They feel better now.   From evaluation: Pain is all back in my bilateral UT with pain down both arms and into hands right greater than left. My Right posterior shoulder is killing me. I have knots everywhere. I have to run my hands under the water because they are numb, and I get needles in the tips of my fingers. I can't hold a pencil.  I am doing all of my exercises, and use my traction every other day and I can feel the feeling start coming back in my right hand. I get relief for the rest of the afternoon until I go to bed. Laying flat makes everything worse. I bought a husband pillow to sleep at night so my arms are on the arms rest and my head and back are elevated. I let hot water beat on me and that helps. They put me on 20 mg prednisone on Feb 28. I don't see a lot of difference with it. The muscle relaxers help at night, but I don't think the gabapentin does much. She has had some ablations of the genicular nerves in her knee that helped for several weeks, but the pain is back again. My right knuckles hurt. I had bilateral breast lifts 12/24/2022. No problems after that with ROM   PERTINENT HISTORY:  Pt with pain involving her neck and right shoulder, right arm, and right hand greater than left. Patient has a history significant for breast cancer with bilateral Lumpectomies; Right for DCIS, left with a Complex Sclerosing Lesion (Benign) and 0/1 incidental nodes. She had Adjuvant radiation on the  right ending on 05/08/2022. Neck pain started  1-3 months after radiation.  Pt had bilateral breast lifts on 12/24/2022 and she has been doing her exercises since then.Neck pain is sharp, stabbing, aching and constant. Noted to have Diffuse DDD, Trace anterolisthesis at C3-C4 and C4-C5, and 2-3 mm anterolisthesis at C7-T1. Trace retrolisthesis at C6-C7,. Multilevel degenerative disc changes with disc space height loss most pronounced and moderate to severe at C5-C6 and C6-C7,Multilevel uncovertebral spurring and facet arthropathy throughout cervical spine.   PAIN: 04/17/23 Are you having pain? Yes NPRS scale: 3/10 presently Pain location: Greatest at right neck, arm and hand Pain orientation: Right  PAIN TYPE: aching, burning, sharp, throbbing, and tingling Pain description: constant  Aggravating factors: sleeping, not sure Relieving factors: traction, warm water running on me  PRECAUTIONS: Prior foot ,ankle (rods and pins) Left LE swelling,, back surgery, gastric sleeve, hysterectomy, pain in bilateral knees   RED FLAGS: Bowel or bladder incontinence: Yes:      WEIGHT BEARING RESTRICTIONS: No  FALLS:  Has patient fallen in last 6 months? 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  COGNITION: Overall cognitive status: Within functional limits for tasks assessed   PALPATION: Tender to palpation bilateral UT,levator, pectorals, posterior cervicals right greater than left  OBSERVATIONS / OTHER ASSESSMENTS: Mild redness dorsum of right hand, pt postures with scapular protraction, and UT elevation  SENSATION: Light touch: Deficits all fingers of right hand with decreased/altered sensation and fingertips of both hands    POSTURE:  forward head, rounded shoulders, UT elevation  UPPER EXTREMITY AROM/PROM:  A/PROM RIGHT   eval  RIGHT 04/20/2023  Shoulder extension 53, pain post shoulder    Shoulder flexion 132, pain top and under arm 160  Shoulder abduction 120, pain chest and under arm 168  Shoulder internal rotation 63   Shoulder external rotation 80, under arm, chest, shoulder     (Blank rows = not tested)  A/PROM LEFT   eval LEFT 04/20/2023  Shoulder extension 55   Shoulder flexion 132 163  Shoulder abduction 132, pain under arm and top of arm 164  Shoulder internal rotation 64   Shoulder external rotation 85     (Blank rows = not tested)  CERVICAL AROM:     Percent limited  Flexion WNL, tight upper back/shoulders  Extension Dec 50%, increased pain neck, arms  Right lateral flexion Dec 20%, pain right neck/shoulder  Left lateral flexion Very tight/painful left UT  Right rotation Decreased 60%, pain right neck  Left rotation WFL, no pain    UPPER EXTREMITY STRENGTH: GRIP #2: Left 35,27              Right  8,5           Pincer grip Left  3,3               Right 2,1  LYMPHEDEMA ASSESSMENTS:   SURGERY TYPE/DATE:02/27/2022 Bilateral breast lumpectomies,  12/25/2022 Bilateral Mastopexy  NUMBER OF LYMPH NODES REMOVED: Left 0/1 incidental LN Right 0 LN's  CHEMOTHERAPY: NO  RADIATION:Adjuvant ended 05/08/2022   HORMONE TREATMENT: NO  INFECTIONS: NO    GRIP: left 35,27              right  8,5 Pincer grip  3,3                   Right 2,1   FUNCTIONAL TESTS:    GAIT: WNL    QUICK DASH SURVEY: 70%                                                                                                                            TREATMENT DATE:  04/23/2023 Cervical rotations and SB x 5 ea Cervical retractions x 10 Scapular retractions x 10 Pectoral stretch in corner 3 x 20 sec STM to bilateral posterior cervicals, UT, levator, and in SL to bilateral scapular areas with cocoa  butter Suboccipital release, manual traction with prolonged hold Supine cervical retractions into therapists hands x 10 Educated pt in supine to and from sit 04/20/2023 Reviewed wall slides x 5, stargazer x 5 and supine shoulder flexion for pt to continue doing at home Bilateral pectoral stretches in doorway x 3 ea Manual traction, sub occipital release STM to bilateral posterior cervicals, UT, levator, and in SL to bilateral scapular areas with cocoa butter Suboccipital release, manual traction with prolonged hold Supine cervical retractions into therapists hands x 10 Measured AROM bilateral shoulders 04/19/2023 Cervical ROM Bilateral rot x 5 , SB x 5, Cervical retraction 2 x 5 all without increased pain and with  increased ROM STM to bilateral posterior cervicals, UT, levator, with cocoa butter, and in SL to bilateral scapular areas with cocoa butter Suboccipital release, manual traction with prolonged hold  04/17/23 Discussion regarding exercises-cervical retraction, scap squeezes, levator/upper trap stretch Trigger Point Dry Needling  Initial Treatment: Pt instructed on Dry Needling rational, procedures, and possible side effects. Pt instructed to expect mild to moderate muscle soreness later in the day and/or into the next day.  Pt instructed in methods to reduce muscle soreness. Pt instructed to continue prescribed HEP. Because Dry Needling was performed over or adjacent to a lung field, pt was educated on S/S of pneumothorax and to seek immediate medical attention should they occur.  Patient was educated on signs and symptoms of infection and other risk factors and advised to seek medical attention should they occur.  Patient verbalized understanding of these instructions and education.   Patient Verbal Consent Given: Yes Education Handout Provided: Yes Muscles Treated: Bil cervical multifidi, bil rhomboids, bil upper trap and levators  Electrical Stimulation Performed:  No Treatment Response/Outcome: twitch and improved tissue mobility   Elongation and release after dry needling  04/13/2023 Discussed importance of proper posture and using towel roll for sitting to maintain more erect posture. Encouraged to try scapular retractions to stretch chest muscles some and to use her traction every day for 10-15 min if she is getting good relief with it. Also reminded about cervical retractions and had her demonstrate. Discussed checking pain first and after several repetitions, and discontinuing if pain is worse. Discussed LOS, POC and treatment interventions.      PATIENT  EDUCATION:  Education details: see above 04/13/2023 Person educated: Patient Education method: Explanation Education comprehension: verbalized understanding and returned demonstration  HOME EXERCISE PROGRAM: Access Code: [email or web address] URL: [email or web address] Date: 04/17/2023 Prepared by: Tresa Endo  Exercises - Sternocleidomastoid Stretch  - 2 x daily - 7 x weekly - 1 sets - 3 reps - 30 hold - Gentle Levator Scapulae Stretch (Mirrored)  - 1 x daily - 7 x weekly - 1 sets - 3 reps - 30 hold - Seated Rhomboid Stretch  - 1 x daily - 7 x weekly - 1 sets - 3 reps - 30 hold - Radial Nerve Flossing (Mirrored)  - 1 x daily - 7 x weekly - 1 sets - 10 reps - Median Nerve Flossing  - 1 x daily - 7 x weekly - 1 sets - 10 reps  ASSESSMENT:  CLINICAL IMPRESSION: Pt is making excellent progress. She has achieved STG's 1,2 ,3 and is progressing with remaining. Fewer tender points throughout.   OBJECTIVE IMPAIRMENTS: decreased activity tolerance, decreased knowledge of condition, decreased ROM, decreased strength, impaired sensation, impaired UE functional use, postural dysfunction, and pain.   ACTIVITY LIMITATIONS: carrying, lifting, sleeping, bed mobility, dressing, and reach over head  PARTICIPATION LIMITATIONS: cleaning, laundry, driving, shopping, and community activity  PERSONAL  FACTORS: 3+ comorbidities: Right breast cancer s/p radiation, chronic pain  are also affecting patient's functional outcome.   REHAB POTENTIAL: Good  CLINICAL DECISION MAKING: Evolving/moderate complexity  EVALUATION COMPLEXITY: Moderate  GOALS: Goals reviewed with patient? Yes  SHORT TERM GOALS: Target date: 05/04/2023  Pt will be independent with HEP for ROM and strengthening of neck and shoulder Baseline: Goal status: MET 04/23/2023 2.  Pt will report overall pain improved by 30% Baseline:  Goal status: MET 04/23/2023 3.  Bilateral shoulder flexion and abduction will be improved to atleast 140 degrees for improved reaching Baseline:  Goal status: MET 04/20/2023 4.  Pt will have quick dash improved by 20 points or more to demonstrate improved function Baseline: 70 Goal status: INITIAL  5.  Right grip strength will improve to 15  lbs for improved ability to perform home activities Baseline:  Goal status: INITIAL   LONG TERM GOALS: Target date: 05/25/2023  Pt will have bilateral shoulder flexion and abd 150 degrees for improved reaching Baseline:  Goal status: Met 04/23/2023 2.  Pt will report overall pain 60% improved or better Baseline:  Goal status: INITIAL  3.  Pt will have right grip strength increased to 20 lbs or more for improved ability to open jars. Baseline:  Goal status: INITIAL  4.  Pt will have quick dash no greater than 20% to demonstrate improved function Baseline:  Goal status: INITIAL  5.  Pt will be able to sleep more comfortably without increased neck pain Baseline:  Goal status: INITIAL   PLAN:  PT FREQUENCY: 3x/week  PT DURATION: 6 weeks   PLANNED INTERVENTIONS: 97164- PT Re-evaluation, 97110-Therapeutic exercises, 97530- Therapeutic activity, 97112- Neuromuscular re-education, 97535- Self Care, 47829- Manual therapy, 97760- Orthotic Fit/training, and Dry Needling  PLAN FOR NEXT SESSION: STM to UT, levator, post. cervicals, scap  region,manual traction, instruct supine clasped hands flex or supine wand for flex, scaption, stargazer, wall slides, progress ROM as tolerated then strength, postural education,review home traction prn, DN as prior 1x/week  Waynette Buttery, PT 04/23/2023, 8:55 AM [facility] [address] [place], [place], [address] Phone: [phone]   Fax:  [phone]  Patient Details  Name: Christie Fox  MRN: [id number] Date of Birth: 1955/12/04 Referring Provider:  Mancel Parsons, MD  Encounter Date: 04/23/2023   Waynette Buttery, PT 04/23/2023, 8:55 AM  [facility] [address] [place], [place], [address] Phone: [phone]   Fax:  [phone]

## 2023-04-26 ENCOUNTER — Ambulatory Visit

## 2023-04-26 DIAGNOSIS — G8929 Other chronic pain: Secondary | ICD-10-CM | POA: Diagnosis not present

## 2023-04-26 DIAGNOSIS — M25612 Stiffness of left shoulder, not elsewhere classified: Secondary | ICD-10-CM

## 2023-04-26 DIAGNOSIS — M25611 Stiffness of right shoulder, not elsewhere classified: Secondary | ICD-10-CM

## 2023-04-26 DIAGNOSIS — Z923 Personal history of irradiation: Secondary | ICD-10-CM

## 2023-04-26 DIAGNOSIS — R293 Abnormal posture: Secondary | ICD-10-CM

## 2023-04-26 DIAGNOSIS — M7918 Myalgia, other site: Secondary | ICD-10-CM

## 2023-04-26 DIAGNOSIS — Z170 Estrogen receptor positive status [ER+]: Secondary | ICD-10-CM

## 2023-04-26 DIAGNOSIS — M542 Cervicalgia: Secondary | ICD-10-CM

## 2023-04-26 NOTE — Therapy (Signed)
 OUTPATIENT PHYSICAL THERAPY  TREATMENT  Patient Name: Christie Fox MRN: [id number] DOB:25-Apr-1955, 68 y.o., female Today's Date: 04/26/2023  END OF SESSION:  PT End of Session - 04/26/23 0750     Visit Number 6    Number of Visits 18    Date for PT Re-Evaluation 05/25/23    Authorization Type [employer]    Authorization Time Period 04/13/2023-05/25/2023    Authorization - Visit Number 6    Authorization - Number of Visits 18    Progress Note Due on Visit 10    PT Start Time 0752    PT Stop Time 0848    PT Time Calculation (min) 56 min    Activity Tolerance Patient tolerated treatment well    Behavior During Therapy WFL for tasks assessed/performed              Past Medical History:  Diagnosis Date   Anxiety    Arthritis    knees   At risk for sleep apnea    STOP-BANG= 4    SENT TO PCP 07-31-2013   Borderline diabetes    diet controlled   Breast cancer (HCC)    Dysuria    History of kidney stones    Hyperlipidemia    Hypertension    Hypothyroidism    Migraines    PONV (postoperative nausea and vomiting)    "please call me Christie Fox to wake me up , not Mrs Mantell"    Renal calculus, left    Urgency of urination    Past Surgical History:  Procedure Laterality Date   BREAST LUMPECTOMY WITH RADIOACTIVE SEED LOCALIZATION Right 02/27/2022   Procedure: RIGHT BREAST LUMPECTOMY WITH RADIOACTIVE SEED LOCALIZATION X3;  Surgeon: Emelia Loron, MD;  Location: [facility];  Service: General;  Laterality: Right;   CESAREAN SECTION  x2   CYSTOSCOPY WITH RETROGRADE PYELOGRAM, URETEROSCOPY AND STENT PLACEMENT Left 08/04/2013   Procedure: 1ST STAGE CYSTOSCOPY WITH RETROGRADE PYELOGRAM, URETEROSCOPY AND STENT PLACEMENT;  Surgeon: Sebastian Ache, MD;  Location: [facility];  Service: Urology;  Laterality: Left;   CYSTOSCOPY WITH RETROGRADE PYELOGRAM, URETEROSCOPY AND STENT PLACEMENT Left 08/20/2013   Procedure: 2ND STAGE CYSTOSCOPY WITH RETROGRADE PYELOGRAM, URETEROSCOPY BASKET  STONES AND STENT EXCHANGE, BLOOD MOP;  Surgeon: Sebastian Ache, MD;  Location: [facility];  Service: Urology;  Laterality: Left;   EXPLORATORY LAPAROTOMY W/ BILATERAL SALPINOOPHORECTOMY  11-27-2001   HOLMIUM LASER APPLICATION Left 08/04/2013   Procedure: HOLMIUM LASER APPLICATION;  Surgeon: Sebastian Ache, MD;  Location: [facility];  Service: Urology;  Laterality: Left;   LAPAROSCOPIC GASTRIC SLEEVE RESECTION N/A 03/27/2017   Procedure: LAPAROSCOPIC GASTRIC SLEEVE RESECTION WITH UPPER ENDO;  Surgeon: Ovidio Kin, MD;  Location: [facility];  Service: General;  Laterality: N/A;   LEFT URETEROSCOPIC LASER LITHO STONE EXTRACTION AND STENT PLACEMENT  03-25-2005   LUMBAR FUSION  X2   LAST ONE 2000   MASTOPEXY Bilateral 12/25/2022   Procedure: MASTOPEXY;  Surgeon: Glenna Fellows, MD;  Location: [facility];  Service: Plastics;  Laterality: Bilateral;   ORIF LEFT ANKLE FX  03-20-2000   RETAINED HARDWARE   RADIOACTIVE SEED GUIDED EXCISIONAL BREAST BIOPSY Left 02/27/2022   Procedure: RADIOACTIVE SEED GUIDED EXCISIONAL LEFT BREAST BIOPSY;  Surgeon: Emelia Loron, MD;  Location: [facility];  Service: General;  Laterality: Left;   VAGINAL HYSTERECTOMY  1999   WISDOM TOOTH EXTRACTION     Patient Active Problem List   Diagnosis Date Noted   Malignant neoplasm of upper  outer 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) 12/30/2021   Hyperlipidemia 08/19/2014   Hypertension 08/19/2014   Hypothyroidism 08/19/2014   Primary osteoarthritis of both knees 08/19/2014      REFERRING PROVIDER: Mancel Parsons, MD  REFERRING DIAG: Chronic musculoskeletal Pain  THERAPY DIAG:  Malignant neoplasm of upper-outer quadrant of right breast in female, estrogen receptor positive (HCC)  Chronic musculoskeletal pain  Cervicalgia  Stiffness of right shoulder, not elsewhere classified  Stiffness of left shoulder, not elsewhere classified  Abnormal posture  Status post radiation  therapy  ONSET DATE: January 2025  Rationale for Evaluation and Treatment: Rehabilitation  SUBJECTIVE:                                                                                                                                                                                           SUBJECTIVE STATEMENT:  04/26/2023 I did OK after last time, but after a few days it is back. Today I hurt in my shoulders, neck, fingers. Even my legs hurt. By 3 in the am my hands start hurting. I did not do my traction this morning. I have been up since 4 AM.   From evaluation: Pain is all back in my bilateral UT with pain down both arms and into hands right greater than left. My Right posterior shoulder is killing me. I have knots everywhere. I have to run my hands under the water because they are numb, and I get needles in the tips of my fingers. I can't hold a pencil.  I am doing all of my exercises, and use my traction every other day and I can feel the feeling start coming back in my right hand. I get relief for the rest of the afternoon until I go to bed. Laying flat makes everything worse. I bought a husband pillow to sleep at night so my arms are on the arms rest and my head and back are elevated. I let hot water beat on me and that helps. They put me on 20 mg prednisone on Feb 28. I don't see a lot of difference with it. The muscle relaxers help at night, but I don't think the gabapentin does much. She has had some ablations of the genicular nerves in her knee that helped for several weeks, but the pain is back again. My right knuckles hurt. I had bilateral breast lifts 12/24/2022. No problems after that with ROM   PERTINENT HISTORY:  Pt with pain involving her neck and right shoulder, right arm, and right hand greater than left. Patient has a history significant for breast cancer with bilateral Lumpectomies; Right for DCIS, left with  a Complex Sclerosing Lesion (Benign) and 0/1 incidental nodes. She had  Adjuvant radiation on the right ending on 05/08/2022. Neck pain started 1-3 months after radiation.  Pt had bilateral breast lifts on 12/24/2022 and she has been doing her exercises since then.Neck pain is sharp, stabbing, aching and constant. Noted to have Diffuse DDD, Trace anterolisthesis at C3-C4 and C4-C5, and 2-3 mm anterolisthesis at C7-T1. Trace retrolisthesis at C6-C7,. Multilevel degenerative disc changes with disc space height loss most pronounced and moderate to severe at C5-C6 and C6-C7,Multilevel uncovertebral spurring and facet arthropathy throughout cervical spine.   PAIN:  Are you having pain? Yes NPRS scale: 6/10 presently Pain location: Greatest at right neck, arm and hand Pain orientation: Right  PAIN TYPE: aching, burning, sharp, throbbing, and tingling Pain description: constant  Aggravating factors: sleeping, not sure Relieving factors: traction, warm water running on me  PRECAUTIONS: Prior foot ,ankle (rods and pins) Left LE swelling,, back surgery, gastric sleeve, hysterectomy, pain in bilateral knees   RED FLAGS: Bowel or bladder incontinence: Yes:      WEIGHT BEARING RESTRICTIONS: No  FALLS:  Has patient fallen in last 6 months? 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  COGNITION: Overall cognitive status: Within functional limits for tasks assessed   PALPATION: Tender to palpation bilateral UT,levator, pectorals, posterior cervicals right greater than left  OBSERVATIONS / OTHER ASSESSMENTS: Mild redness dorsum of right hand, pt postures with scapular protraction, and UT elevation  SENSATION: Light touch: Deficits all fingers of right hand with  decreased/altered sensation and fingertips of both hands    POSTURE: forward head, rounded shoulders, UT elevation  UPPER EXTREMITY AROM/PROM:  A/PROM RIGHT   eval  RIGHT 04/20/2023  Shoulder extension 53, pain post shoulder    Shoulder flexion 132, pain top and under arm 160  Shoulder abduction 120, pain chest and under arm 168  Shoulder internal rotation 63   Shoulder external rotation 80, under arm, chest, shoulder     (Blank rows = not tested)  A/PROM LEFT   eval LEFT 04/20/2023  Shoulder extension 55   Shoulder flexion 132 163  Shoulder abduction 132, pain under arm and top of arm 164  Shoulder internal rotation 64   Shoulder external rotation 85     (Blank rows = not tested)  CERVICAL AROM:     Percent limited  Flexion WNL, tight upper back/shoulders  Extension Dec 50%, increased pain neck, arms  Right lateral flexion Dec 20%, pain right neck/shoulder  Left lateral flexion Very tight/painful left UT  Right rotation Decreased 60%, pain right neck  Left rotation WFL, no pain    UPPER EXTREMITY STRENGTH: GRIP #2: Left 35,27              Right  8,5           Pincer grip Left  3,3               Right 2,1  LYMPHEDEMA ASSESSMENTS:   SURGERY TYPE/DATE:02/27/2022 Bilateral breast lumpectomies,  12/25/2022 Bilateral Mastopexy  NUMBER OF LYMPH NODES REMOVED: Left 0/1 incidental LN Right 0 LN's  CHEMOTHERAPY: NO  RADIATION:Adjuvant ended 05/08/2022   HORMONE TREATMENT: NO  INFECTIONS: NO    GRIP: left  35,27              right  8,5 Pincer grip  3,3                   Right 2,1   FUNCTIONAL TESTS:    GAIT: WNL    QUICK DASH SURVEY: 70%                                                                                                                            TREATMENT DATE:   04/26/2023 Cervical rotations and SB x 5 ea Cervical retractions x 10 Scapular retractions x 10 Pectoral stretch in corner 4 x 20 sec STM to bilateral posterior cervicals, UT,  levator, and in SL to bilateral scapular areas with cocoa butter Suboccipital release, manual traction with prolonged hold Supine cervical retractions into therapists hands x 10  04/23/2023 Cervical rotations and SB x 5 ea Cervical retractions x 10 Scapular retractions x 10 Pectoral stretch in corner 3 x 20 sec STM to bilateral posterior cervicals, UT, levator, and in SL to bilateral scapular areas with cocoa butter Suboccipital release, manual traction with prolonged hold Supine cervical retractions into therapists hands x 10 Educated pt in supine to and from sit 04/20/2023 Reviewed wall slides x 5, stargazer x 5 and supine shoulder flexion for pt to continue doing at home Bilateral pectoral stretches in doorway x 3 ea Manual traction, sub occipital release STM to bilateral posterior cervicals, UT, levator, and in SL to bilateral scapular areas with cocoa butter Suboccipital release, manual traction with prolonged hold Supine cervical retractions into therapists hands x 10 Measured AROM bilateral shoulders 04/19/2023 Cervical ROM Bilateral rot x 5 , SB x 5, Cervical retraction 2 x 5 all without increased pain and with  increased ROM STM to bilateral posterior cervicals, UT, levator, with cocoa butter, and in SL to bilateral scapular areas with cocoa butter Suboccipital release, manual traction with prolonged hold  04/17/23 Discussion regarding exercises-cervical retraction, scap squeezes, levator/upper trap stretch Trigger Point Dry Needling  Initial Treatment: Pt instructed on Dry Needling rational, procedures, and possible side effects. Pt instructed to expect mild to moderate muscle soreness later in the day and/or into the next day.  Pt instructed in methods to reduce muscle soreness. Pt instructed to continue prescribed HEP. Because Dry Needling was performed over or adjacent to a lung field, pt was educated on S/S of pneumothorax and to seek immediate medical attention should  they occur.  Patient was educated on signs and symptoms of infection and other risk factors and advised to seek medical attention should they occur.  Patient verbalized understanding of these instructions and education.   Patient Verbal Consent Given: Yes Education Handout Provided: Yes Muscles Treated: Bil cervical multifidi, bil rhomboids, bil upper trap and levators  Electrical Stimulation Performed: No Treatment Response/Outcome: twitch and improved tissue mobility   Elongation and release after dry needling  04/13/2023 Discussed importance of proper posture  and using towel roll for sitting to maintain more erect posture. Encouraged to try scapular retractions to stretch chest muscles some and to use her traction every day for 10-15 min if she is getting good relief with it. Also reminded about cervical retractions and had her demonstrate. Discussed checking pain first and after several repetitions, and discontinuing if pain is worse. Discussed LOS, POC and treatment interventions.      PATIENT EDUCATION:  Education details: see above 04/13/2023 Person educated: Patient Education method: Explanation Education comprehension: verbalized understanding and returned demonstration  HOME EXERCISE PROGRAM: Access Code: [email or web address] URL: [email or web address] Date: 04/17/2023 Prepared by: Tresa Endo  Exercises - Sternocleidomastoid Stretch  - 2 x daily - 7 x weekly - 1 sets - 3 reps - 30 hold - Gentle Levator Scapulae Stretch (Mirrored)  - 1 x daily - 7 x weekly - 1 sets - 3 reps - 30 hold - Seated Rhomboid Stretch  - 1 x daily - 7 x weekly - 1 sets - 3 reps - 30 hold - Radial Nerve Flossing (Mirrored)  - 1 x daily - 7 x weekly - 1 sets - 10 reps - Median Nerve Flossing  - 1 x daily - 7 x weekly - 1 sets - 10 reps  ASSESSMENT:  CLINICAL IMPRESSION: Pt with increased complaints today. She gets benefit from therapy for several days and pain returns.  Pt felt better after therapy  and more relaxed. Pain 2/10 afterwards.   OBJECTIVE IMPAIRMENTS: decreased activity tolerance, decreased knowledge of condition, decreased ROM, decreased strength, impaired sensation, impaired UE functional use, postural dysfunction, and pain.   ACTIVITY LIMITATIONS: carrying, lifting, sleeping, bed mobility, dressing, and reach over head  PARTICIPATION LIMITATIONS: cleaning, laundry, driving, shopping, and community activity  PERSONAL FACTORS: 3+ comorbidities: Right breast cancer s/p radiation, chronic pain  are also affecting patient's functional outcome.   REHAB POTENTIAL: Good  CLINICAL DECISION MAKING: Evolving/moderate complexity  EVALUATION COMPLEXITY: Moderate  GOALS: Goals reviewed with patient? Yes  SHORT TERM GOALS: Target date: 05/04/2023  Pt will be independent with HEP for ROM and strengthening of neck and shoulder Baseline: Goal status: MET 04/23/2023 2.  Pt will report overall pain improved by 30% Baseline:  Goal status: MET 04/23/2023 3.  Bilateral shoulder flexion and abduction will be improved to atleast 140 degrees for improved reaching Baseline:  Goal status: MET 04/20/2023 4.  Pt will have quick dash improved by 20 points or more to demonstrate improved function Baseline: 70 Goal status: INITIAL  5.  Right grip strength will improve to 15  lbs for improved ability to perform home activities Baseline:  Goal status: INITIAL   LONG TERM GOALS: Target date: 05/25/2023  Pt will have bilateral shoulder flexion and abd 150 degrees for improved reaching Baseline:  Goal status: Met 04/23/2023 2.  Pt will report overall pain 60% improved or better Baseline:  Goal status: INITIAL  3.  Pt will have right grip strength increased to 20 lbs or more for improved ability to open jars. Baseline:  Goal status: INITIAL  4.  Pt will have quick dash no greater than 20% to demonstrate improved function Baseline:  Goal status: INITIAL  5.  Pt will be able to sleep  more comfortably without increased neck pain Baseline:  Goal status: INITIAL   PLAN:  PT FREQUENCY: 3x/week  PT DURATION: 6 weeks   PLANNED INTERVENTIONS: 97164- PT Re-evaluation, 97110-Therapeutic exercises, 97530- Therapeutic activity, 97112- Neuromuscular re-education, 97535- Self Care, 54098- Manual therapy, 97760- Orthotic Fit/training,  and Dry Needling  PLAN FOR NEXT SESSION: STM to UT, levator, post. cervicals, scap region,manual traction, instruct supine clasped hands flex or supine wand for flex, scaption, stargazer, wall slides, progress ROM as tolerated then strength, postural education,review home traction prn, DN as prior 1x/week  Waynette Buttery, PT 04/26/2023, 8:49 AM [facility] [address] [place], [place], [address] Phone: [phone]   Fax:  [phone]  Patient Details  Name: KARLIE AUNG MRN: [id number] Date of Birth: Sep 01, 1955 Referring Provider:  Mancel Parsons, MD  Encounter Date: 04/26/2023   Waynette Buttery, PT 04/26/2023, 8:49 AM  [facility] [address] [place], [place], [address] Phone: [phone]   Fax:  [phone]

## 2023-04-27 ENCOUNTER — Ambulatory Visit: Admitting: Physical Therapy

## 2023-04-27 DIAGNOSIS — G8929 Other chronic pain: Secondary | ICD-10-CM | POA: Diagnosis not present

## 2023-04-27 DIAGNOSIS — M542 Cervicalgia: Secondary | ICD-10-CM

## 2023-04-27 DIAGNOSIS — C50411 Malignant neoplasm of upper-outer quadrant of right female breast: Secondary | ICD-10-CM

## 2023-04-27 NOTE — Therapy (Signed)
 OUTPATIENT PHYSICAL THERAPY  TREATMENT  Patient Name: Christie Fox MRN: [id number] DOB:07-07-1955, 68 y.o., female Today's Date: 04/27/2023  END OF SESSION:  PT End of Session - 04/27/23 0753     Visit Number 7    Number of Visits 18    Date for PT Re-Evaluation 05/25/23    Authorization Type [employer]    Authorization Time Period 04/13/2023-05/25/2023    Authorization - Visit Number 7    Authorization - Number of Visits 18    Progress Note Due on Visit 10    PT Start Time 0753    PT Stop Time 0832    PT Time Calculation (min) 39 min    Activity Tolerance Patient tolerated treatment well              Past Medical History:  Diagnosis Date   Anxiety    Arthritis    knees   At risk for sleep apnea    STOP-BANG= 4    SENT TO PCP 07-31-2013   Borderline diabetes    diet controlled   Breast cancer (HCC)    Dysuria    History of kidney stones    Hyperlipidemia    Hypertension    Hypothyroidism    Migraines    PONV (postoperative nausea and vomiting)    "please call me Christie Fox to wake me up , not Christie Fox"    Renal calculus, left    Urgency of urination    Past Surgical History:  Procedure Laterality Date   BREAST LUMPECTOMY WITH RADIOACTIVE SEED LOCALIZATION Right 02/27/2022   Procedure: RIGHT BREAST LUMPECTOMY WITH RADIOACTIVE SEED LOCALIZATION X3;  Surgeon: Emelia Loron, MD;  Location: [facility];  Service: General;  Laterality: Right;   CESAREAN SECTION  x2   CYSTOSCOPY WITH RETROGRADE PYELOGRAM, URETEROSCOPY AND STENT PLACEMENT Left 08/04/2013   Procedure: 1ST STAGE CYSTOSCOPY WITH RETROGRADE PYELOGRAM, URETEROSCOPY AND STENT PLACEMENT;  Surgeon: Sebastian Ache, MD;  Location: [facility];  Service: Urology;  Laterality: Left;   CYSTOSCOPY WITH RETROGRADE PYELOGRAM, URETEROSCOPY AND STENT PLACEMENT Left 08/20/2013   Procedure: 2ND STAGE CYSTOSCOPY WITH RETROGRADE PYELOGRAM, URETEROSCOPY BASKET STONES AND STENT EXCHANGE, BLOOD MOP;  Surgeon: Sebastian Ache, MD;  Location: [facility];  Service: Urology;  Laterality: Left;   EXPLORATORY LAPAROTOMY W/ BILATERAL SALPINOOPHORECTOMY  11-27-2001   HOLMIUM LASER APPLICATION Left 08/04/2013   Procedure: HOLMIUM LASER APPLICATION;  Surgeon: Sebastian Ache, MD;  Location: [facility];  Service: Urology;  Laterality: Left;   LAPAROSCOPIC GASTRIC SLEEVE RESECTION N/A 03/27/2017   Procedure: LAPAROSCOPIC GASTRIC SLEEVE RESECTION WITH UPPER ENDO;  Surgeon: Ovidio Kin, MD;  Location: [facility];  Service: General;  Laterality: N/A;   LEFT URETEROSCOPIC LASER LITHO STONE EXTRACTION AND STENT PLACEMENT  03-25-2005   LUMBAR FUSION  X2   LAST ONE 2000   MASTOPEXY Bilateral 12/25/2022   Procedure: MASTOPEXY;  Surgeon: Glenna Fellows, MD;  Location: [facility];  Service: Plastics;  Laterality: Bilateral;   ORIF LEFT ANKLE FX  03-20-2000   RETAINED HARDWARE   RADIOACTIVE SEED GUIDED EXCISIONAL BREAST BIOPSY Left 02/27/2022   Procedure: RADIOACTIVE SEED GUIDED EXCISIONAL LEFT BREAST BIOPSY;  Surgeon: Emelia Loron, MD;  Location: [facility];  Service: General;  Laterality: Left;   VAGINAL HYSTERECTOMY  1999   WISDOM TOOTH EXTRACTION     Patient Active Problem List   Diagnosis Date Noted   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) 12/30/2021   Hyperlipidemia  08/19/2014   Hypertension 08/19/2014   Hypothyroidism 08/19/2014   Primary osteoarthritis of both knees 08/19/2014      REFERRING PROVIDER: Mancel Parsons, MD  REFERRING DIAG: Chronic musculoskeletal Pain  THERAPY DIAG:  Malignant neoplasm of upper-outer quadrant of right breast in female, estrogen receptor positive (HCC)  Cervicalgia  Chronic musculoskeletal pain  ONSET DATE: January 2025  Rationale for Evaluation and Treatment: Rehabilitation  SUBJECTIVE:                                                                                                                                                                                            SUBJECTIVE STATEMENT:  Hanging in there.  Pain particularly in bil upper shoulders and between the shoulder blades. Denies soreness from prior DN.   From evaluation: Pain is all back in my bilateral UT with pain down both arms and into hands right greater than left. My Right posterior shoulder is killing me. I have knots everywhere. I have to run my hands under the water because they are numb, and I get needles in the tips of my fingers. I can't hold a pencil.  I am doing all of my exercises, and use my traction every other day and I can feel the feeling start coming back in my right hand. I get relief for the rest of the afternoon until I go to bed. Laying flat makes everything worse. I bought a husband pillow to sleep at night so my arms are on the arms rest and my head and back are elevated. I let hot water beat on me and that helps. They put me on 20 mg prednisone on Feb 28. I don't see a lot of difference with it. The muscle relaxers help at night, but I don't think the gabapentin does much. She has had some ablations of the genicular nerves in her knee that helped for several weeks, but the pain is back again. My right knuckles hurt. I had bilateral breast lifts 12/24/2022. No problems after that with ROM   PERTINENT HISTORY:  Pt with pain involving her neck and right shoulder, right arm, and right hand greater than left. Patient has a history significant for breast cancer with bilateral Lumpectomies; Right for DCIS, left with a Complex Sclerosing Lesion (Benign) and 0/1 incidental nodes. She had Adjuvant radiation on the right ending on 05/08/2022. Neck pain started 1-3 months after radiation.  Pt had bilateral breast lifts on 12/24/2022 and she has been doing her exercises since then.Neck pain is sharp, stabbing, aching and constant. Noted to have Diffuse DDD, Trace anterolisthesis at C3-C4 and C4-C5, and 2-3 mm anterolisthesis  at  C7-T1. Trace retrolisthesis at C6-C7,. Multilevel degenerative disc changes with disc space height loss most pronounced and moderate to severe at C5-C6 and C6-C7,Multilevel uncovertebral spurring and facet arthropathy throughout cervical spine.   PAIN:  Are you having pain? Yes NPRS scale: 6/10  Pain location: Greatest at right neck, arm and hand Pain orientation: Right  PAIN TYPE: aching, burning, sharp, throbbing, and tingling Pain description: constant  Aggravating factors: sleeping, not sure Relieving factors: traction, warm water running on me  PRECAUTIONS: Prior foot ,ankle (rods and pins) Left LE swelling,, back surgery, gastric sleeve, hysterectomy, pain in bilateral knees   RED FLAGS: Bowel or bladder incontinence: Yes:      WEIGHT BEARING RESTRICTIONS: No  FALLS:  Has patient fallen in last 6 months? 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  COGNITION: Overall cognitive status: Within functional limits for tasks assessed   PALPATION: Tender to palpation bilateral UT,levator, pectorals, posterior cervicals right greater than left  OBSERVATIONS / OTHER ASSESSMENTS: Mild redness dorsum of right hand, pt postures with scapular protraction, and UT elevation  SENSATION: Light touch: Deficits all fingers of right hand with decreased/altered sensation and fingertips of both hands    POSTURE: forward head, rounded shoulders, UT elevation  UPPER EXTREMITY AROM/PROM:  A/PROM RIGHT   eval  RIGHT 04/20/2023  Shoulder extension 53, pain post shoulder    Shoulder flexion 132, pain top and under arm 160  Shoulder abduction 120, pain chest and under arm 168  Shoulder  internal rotation 63   Shoulder external rotation 80, under arm, chest, shoulder     (Blank rows = not tested)  A/PROM LEFT   eval LEFT 04/20/2023  Shoulder extension 55   Shoulder flexion 132 163  Shoulder abduction 132, pain under arm and top of arm 164  Shoulder internal rotation 64   Shoulder external rotation 85     (Blank rows = not tested)  CERVICAL AROM:     Percent limited  Flexion WNL, tight upper back/shoulders  Extension Dec 50%, increased pain neck, arms  Right lateral flexion Dec 20%, pain right neck/shoulder  Left lateral flexion Very tight/painful left UT  Right rotation Decreased 60%, pain right neck  Left rotation WFL, no pain    UPPER EXTREMITY STRENGTH: GRIP #2: Left 35,27              Right  8,5           Pincer grip Left  3,3               Right 2,1  LYMPHEDEMA ASSESSMENTS:   SURGERY TYPE/DATE:02/27/2022 Bilateral breast lumpectomies,  12/25/2022 Bilateral Mastopexy  NUMBER OF LYMPH NODES REMOVED: Left 0/1 incidental LN Right 0 LN's  CHEMOTHERAPY: NO  RADIATION:Adjuvant ended 05/08/2022   HORMONE TREATMENT: NO  INFECTIONS: NO    GRIP: left 35,27              right  8,5 Pincer grip  3,3                   Right 2,1   FUNCTIONAL TESTS:    GAIT: WNL    QUICK DASH SURVEY: 70%  TREATMENT DATE:  04/27/2023 Standing Yellow band: rows 10x Standing yellow band: bil shoulder extension 10x Standing yellow band: external rotation 10x Manual therapy: cervical and periscapular musculature Trigger Point Dry Needling Subsequent Treatment: Pt instructed on Dry Needling rational, procedures, and possible side effects. Pt instructed to expect mild to moderate muscle soreness later in the day and/or into the next day.  Pt instructed in methods to reduce muscle soreness. Pt instructed to continue prescribed HEP. Because Dry  Needling was performed over or adjacent to a lung field, pt was educated on S/S of pneumothorax and to seek immediate medical attention should they occur.  Patient was educated on signs and symptoms of infection and other risk factors and advised to seek medical attention should they occur.  Patient verbalized understanding of these instructions and education.   Patient Verbal Consent Given: Yes Education Handout Provided: Yes Muscles Treated:  bil rhomboids, bil upper trap, subscapularis and levators  Electrical Stimulation Performed: No Treatment Response/Outcome: twitch and improved tissue mobility   Elongation and release after dry needling   04/26/2023 Cervical rotations and SB x 5 ea Cervical retractions x 10 Scapular retractions x 10 Pectoral stretch in corner 4 x 20 sec STM to bilateral posterior cervicals, UT, levator, and in SL to bilateral scapular areas with cocoa butter Suboccipital release, manual traction with prolonged hold Supine cervical retractions into therapists hands x 10  04/23/2023 Cervical rotations and SB x 5 ea Cervical retractions x 10 Scapular retractions x 10 Pectoral stretch in corner 3 x 20 sec STM to bilateral posterior cervicals, UT, levator, and in SL to bilateral scapular areas with cocoa butter Suboccipital release, manual traction with prolonged hold Supine cervical retractions into therapists hands x 10 Educated pt in supine to and from sit 04/20/2023 Reviewed wall slides x 5, stargazer x 5 and supine shoulder flexion for pt to continue doing at home Bilateral pectoral stretches in doorway x 3 ea Manual traction, sub occipital release STM to bilateral posterior cervicals, UT, levator, and in SL to bilateral scapular areas with cocoa butter Suboccipital release, manual traction with prolonged hold Supine cervical retractions into therapists hands x 10 Measured AROM bilateral shoulders 04/19/2023 Cervical ROM Bilateral rot x 5 , SB x 5, Cervical  retraction 2 x 5 all without increased pain and with  increased ROM STM to bilateral posterior cervicals, UT, levator, with cocoa butter, and in SL to bilateral scapular areas with cocoa butter Suboccipital release, manual traction with prolonged hold  04/17/23 Discussion regarding exercises-cervical retraction, scap squeezes, levator/upper trap stretch Trigger Point Dry Needling  Initial Treatment: Pt instructed on Dry Needling rational, procedures, and possible side effects. Pt instructed to expect mild to moderate muscle soreness later in the day and/or into the next day.  Pt instructed in methods to reduce muscle soreness. Pt instructed to continue prescribed HEP. Because Dry Needling was performed over or adjacent to a lung field, pt was educated on S/S of pneumothorax and to seek immediate medical attention should they occur.  Patient was educated on signs and symptoms of infection and other risk factors and advised to seek medical attention should they occur.  Patient verbalized understanding of these instructions and education.   Patient Verbal Consent Given: Yes Education Handout Provided: Yes Muscles Treated: Bil cervical multifidi, bil rhomboids, bil upper trap and levators  Electrical Stimulation Performed: No Treatment Response/Outcome: twitch and improved tissue mobility   Elongation and release after dry needling  04/13/2023 Discussed importance of proper posture and using  towel roll for sitting to maintain more erect posture. Encouraged to try scapular retractions to stretch chest muscles some and to use her traction every day for 10-15 min if she is getting good relief with it. Also reminded about cervical retractions and had her demonstrate. Discussed checking pain first and after several repetitions, and discontinuing if pain is worse. Discussed LOS, POC and treatment interventions.      PATIENT EDUCATION:  Education details: see above 04/13/2023 Person educated:  Patient Education method: Explanation Education comprehension: verbalized understanding and returned demonstration  HOME EXERCISE PROGRAM: Access Code: [email or web address] URL: [email or web address] Date: 04/17/2023 Prepared by: Tresa Endo  Exercises - Sternocleidomastoid Stretch  - 2 x daily - 7 x weekly - 1 sets - 3 reps - 30 hold - Gentle Levator Scapulae Stretch (Mirrored)  - 1 x daily - 7 x weekly - 1 sets - 3 reps - 30 hold - Seated Rhomboid Stretch  - 1 x daily - 7 x weekly - 1 sets - 3 reps - 30 hold - Radial Nerve Flossing (Mirrored)  - 1 x daily - 7 x weekly - 1 sets - 10 reps - Median Nerve Flossing  - 1 x daily - 7 x weekly - 1 sets - 10 reps  ASSESSMENT:  CLINICAL IMPRESSION: The patient benefits significantly from dry needling and manual therapy to stimulate underlying myofascial trigger points and muscular tissue for management of neuromusculoskeletal pain and address movement impairments.  Much improved soft tissue mobility and decreased tender point size and number following treatment session.   The patient was encouraged in regular performance of HEP post DN including soft tissue lengthening and muscle pumping exercises to enhance long term benefit.     OBJECTIVE IMPAIRMENTS: decreased activity tolerance, decreased knowledge of condition, decreased ROM, decreased strength, impaired sensation, impaired UE functional use, postural dysfunction, and pain.   ACTIVITY LIMITATIONS: carrying, lifting, sleeping, bed mobility, dressing, and reach over head  PARTICIPATION LIMITATIONS: cleaning, laundry, driving, shopping, and community activity  PERSONAL FACTORS: 3+ comorbidities: Right breast cancer s/p radiation, chronic pain  are also affecting patient's functional outcome.   REHAB POTENTIAL: Good  CLINICAL DECISION MAKING: Evolving/moderate complexity  EVALUATION COMPLEXITY: Moderate  GOALS: Goals reviewed with patient? Yes  SHORT TERM GOALS: Target date:  05/04/2023  Pt will be independent with HEP for ROM and strengthening of neck and shoulder Baseline: Goal status: MET 04/23/2023 2.  Pt will report overall pain improved by 30% Baseline:  Goal status: MET 04/23/2023 3.  Bilateral shoulder flexion and abduction will be improved to atleast 140 degrees for improved reaching Baseline:  Goal status: MET 04/20/2023 4.  Pt will have quick dash improved by 20 points or more to demonstrate improved function Baseline: 70 Goal status: INITIAL  5.  Right grip strength will improve to 15  lbs for improved ability to perform home activities Baseline:  Goal status: INITIAL   LONG TERM GOALS: Target date: 05/25/2023  Pt will have bilateral shoulder flexion and abd 150 degrees for improved reaching Baseline:  Goal status: Met 04/23/2023 2.  Pt will report overall pain 60% improved or better Baseline:  Goal status: INITIAL  3.  Pt will have right grip strength increased to 20 lbs or more for improved ability to open jars. Baseline:  Goal status: INITIAL  4.  Pt will have quick dash no greater than 20% to demonstrate improved function Baseline:  Goal status: INITIAL  5.  Pt will be able to sleep more comfortably without increased neck  pain Baseline:  Goal status: INITIAL   PLAN:  PT FREQUENCY: 3x/week  PT DURATION: 6 weeks   PLANNED INTERVENTIONS: 97164- PT Re-evaluation, 97110-Therapeutic exercises, 97530- Therapeutic activity, 97112- Neuromuscular re-education, 97535- Self Care, 78295- Manual therapy, 97760- Orthotic Fit/training, and Dry Needling  PLAN FOR NEXT SESSION: STM to UT, levator, post. cervicals, scap region,manual traction, instruct supine clasped hands flex or supine wand for flex, scaption, stargazer, wall slides, progress ROM as tolerated then strength, postural education,review home traction prn, DN as prior 1x/week  Lavinia Sharps, PT 04/27/23 8:46 AM Phone: [phone] Fax: [phone]

## 2023-04-30 ENCOUNTER — Ambulatory Visit

## 2023-04-30 DIAGNOSIS — C50411 Malignant neoplasm of upper-outer quadrant of right female breast: Secondary | ICD-10-CM

## 2023-04-30 DIAGNOSIS — M542 Cervicalgia: Secondary | ICD-10-CM

## 2023-04-30 DIAGNOSIS — G8929 Other chronic pain: Secondary | ICD-10-CM | POA: Diagnosis not present

## 2023-04-30 DIAGNOSIS — M25611 Stiffness of right shoulder, not elsewhere classified: Secondary | ICD-10-CM

## 2023-04-30 DIAGNOSIS — M7918 Myalgia, other site: Secondary | ICD-10-CM

## 2023-04-30 DIAGNOSIS — M25612 Stiffness of left shoulder, not elsewhere classified: Secondary | ICD-10-CM

## 2023-04-30 NOTE — Therapy (Signed)
 OUTPATIENT PHYSICAL THERAPY  TREATMENT  Patient Name: Christie Fox MRN: [id number] DOB:Mar 01, 1955, 68 y.o., female Today's Date: 04/30/2023  END OF SESSION:  PT End of Session - 04/30/23 0754     Visit Number 8    Number of Visits 18    Date for PT Re-Evaluation 05/25/23    Authorization Type [employer]    Authorization Time Period 04/13/2023-05/25/2023    Authorization - Visit Number 8    Authorization - Number of Visits 18    Progress Note Due on Visit 10    PT Start Time 0758    PT Stop Time 0855    PT Time Calculation (min) 57 min    Activity Tolerance Patient tolerated treatment well    Behavior During Therapy [facility] for tasks assessed/performed              Past Medical History:  Diagnosis Date   Anxiety    Arthritis    knees   At risk for sleep apnea    STOP-BANG= 4    SENT TO PCP 07-31-2013   Borderline diabetes    diet controlled   Breast cancer (HCC)    Dysuria    History of kidney stones    Hyperlipidemia    Hypertension    Hypothyroidism    Migraines    PONV (postoperative nausea and vomiting)    "please call me Smantha to wake me up , not Mrs Giarratano"    Renal calculus, left    Urgency of urination    Past Surgical History:  Procedure Laterality Date   BREAST LUMPECTOMY WITH RADIOACTIVE SEED LOCALIZATION Right 02/27/2022   Procedure: RIGHT BREAST LUMPECTOMY WITH RADIOACTIVE SEED LOCALIZATION X3;  Surgeon: Emelia Loron, MD;  Location: [facility];  Service: General;  Laterality: Right;   CESAREAN SECTION  x2   CYSTOSCOPY WITH RETROGRADE PYELOGRAM, URETEROSCOPY AND STENT PLACEMENT Left 08/04/2013   Procedure: 1ST STAGE CYSTOSCOPY WITH RETROGRADE PYELOGRAM, URETEROSCOPY AND STENT PLACEMENT;  Surgeon: Sebastian Ache, MD;  Location: [facility];  Service: Urology;  Laterality: Left;   CYSTOSCOPY WITH RETROGRADE PYELOGRAM, URETEROSCOPY AND STENT PLACEMENT Left 08/20/2013   Procedure: 2ND STAGE CYSTOSCOPY WITH RETROGRADE PYELOGRAM, URETEROSCOPY BASKET  STONES AND STENT EXCHANGE, BLOOD MOP;  Surgeon: Sebastian Ache, MD;  Location: [facility];  Service: Urology;  Laterality: Left;   EXPLORATORY LAPAROTOMY W/ BILATERAL SALPINOOPHORECTOMY  11-27-2001   HOLMIUM LASER APPLICATION Left 08/04/2013   Procedure: HOLMIUM LASER APPLICATION;  Surgeon: Sebastian Ache, MD;  Location: [facility];  Service: Urology;  Laterality: Left;   LAPAROSCOPIC GASTRIC SLEEVE RESECTION N/A 03/27/2017   Procedure: LAPAROSCOPIC GASTRIC SLEEVE RESECTION WITH UPPER ENDO;  Surgeon: Ovidio Kin, MD;  Location: [facility];  Service: General;  Laterality: N/A;   LEFT URETEROSCOPIC LASER LITHO STONE EXTRACTION AND STENT PLACEMENT  03-25-2005   LUMBAR FUSION  X2   LAST ONE 2000   MASTOPEXY Bilateral 12/25/2022   Procedure: MASTOPEXY;  Surgeon: Glenna Fellows, MD;  Location: [facility];  Service: Plastics;  Laterality: Bilateral;   ORIF LEFT ANKLE FX  03-20-2000   RETAINED HARDWARE   RADIOACTIVE SEED GUIDED EXCISIONAL BREAST BIOPSY Left 02/27/2022   Procedure: RADIOACTIVE SEED GUIDED EXCISIONAL LEFT BREAST BIOPSY;  Surgeon: Emelia Loron, MD;  Location: [facility];  Service: General;  Laterality: Left;   VAGINAL HYSTERECTOMY  1999   WISDOM TOOTH EXTRACTION     Patient Active Problem List   Diagnosis Date Noted   Malignant neoplasm of upper  outer 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) 12/30/2021   Hyperlipidemia 08/19/2014   Hypertension 08/19/2014   Hypothyroidism 08/19/2014   Primary osteoarthritis of both knees 08/19/2014      REFERRING PROVIDER: Mancel Parsons, MD  REFERRING DIAG: Chronic musculoskeletal Pain  THERAPY DIAG:  Malignant neoplasm of upper-outer quadrant of right breast in female, estrogen receptor positive (HCC)  Cervicalgia  Chronic musculoskeletal pain  Stiffness of right shoulder, not elsewhere classified  Stiffness of left shoulder, not elsewhere classified  ONSET DATE: January 2025  Rationale for  Evaluation and Treatment: Rehabilitation  SUBJECTIVE:                                                                                                                                                                                           SUBJECTIVE STATEMENT:  Yesterday I did the traction and my hands were better. I use something like icy hot on my finger tips at night and it helps me get to sleep. My finger tips still get numb. Did well after DN. I did my traction already today and my cervical ROM exercises.   From evaluation: Pain is all back in my bilateral UT with pain down both arms and into hands right greater than left. My Right posterior shoulder is killing me. I have knots everywhere. I have to run my hands under the water because they are numb, and I get needles in the tips of my fingers. I can't hold a pencil.  I am doing all of my exercises, and use my traction every other day and I can feel the feeling start coming back in my right hand. I get relief for the rest of the afternoon until I go to bed. Laying flat makes everything worse. I bought a husband pillow to sleep at night so my arms are on the arms rest and my head and back are elevated. I let hot water beat on me and that helps. They put me on 20 mg prednisone on Feb 28. I don't see a lot of difference with it. The muscle relaxers help at night, but I don't think the gabapentin does much. She has had some ablations of the genicular nerves in her knee that helped for several weeks, but the pain is back again. My right knuckles hurt. I had bilateral breast lifts 12/24/2022. No problems after that with ROM   PERTINENT HISTORY:  Pt with pain involving her neck and right shoulder, right arm, and right hand greater than left. Patient has a history significant for breast cancer with bilateral Lumpectomies; Right for DCIS, left with a Complex Sclerosing Lesion (Benign) and 0/1 incidental nodes.  She had Adjuvant radiation on the right ending  on 05/08/2022. Neck pain started 1-3 months after radiation.  Pt had bilateral breast lifts on 12/24/2022 and she has been doing her exercises since then.Neck pain is sharp, stabbing, aching and constant. Noted to have Diffuse DDD, Trace anterolisthesis at C3-C4 and C4-C5, and 2-3 mm anterolisthesis at C7-T1. Trace retrolisthesis at C6-C7,. Multilevel degenerative disc changes with disc space height loss most pronounced and moderate to severe at C5-C6 and C6-C7,Multilevel uncovertebral spurring and facet arthropathy throughout cervical spine.   PAIN:  Are you having pain? Yes NPRS scale: 1-2/10  Pain location: Greatest at right neck, arm and hand Pain orientation: Right  PAIN TYPE: aching, burning, sharp, throbbing, and tingling Pain description: constant  Aggravating factors: sleeping, not sure Relieving factors: traction, warm water running on me  PRECAUTIONS: Prior foot ,ankle (rods and pins) Left LE swelling,, back surgery, gastric sleeve, hysterectomy, pain in bilateral knees   RED FLAGS: Bowel or bladder incontinence: Yes:      WEIGHT BEARING RESTRICTIONS: No  FALLS:  Has patient fallen in last 6 months? 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  COGNITION: Overall cognitive status: Within functional limits for tasks assessed   PALPATION: Tender to palpation bilateral UT,levator, pectorals, posterior cervicals right greater than left  OBSERVATIONS / OTHER ASSESSMENTS: Mild redness dorsum of right hand, pt postures with scapular protraction, and UT elevation  SENSATION: Light touch: Deficits all fingers of right hand with decreased/altered sensation and fingertips of both  hands    POSTURE: forward head, rounded shoulders, UT elevation  UPPER EXTREMITY AROM/PROM:  A/PROM RIGHT   eval  RIGHT 04/20/2023  Shoulder extension 53, pain post shoulder    Shoulder flexion 132, pain top and under arm 160  Shoulder abduction 120, pain chest and under arm 168  Shoulder internal rotation 63   Shoulder external rotation 80, under arm, chest, shoulder     (Blank rows = not tested)  A/PROM LEFT   eval LEFT 04/20/2023  Shoulder extension 55   Shoulder flexion 132 163  Shoulder abduction 132, pain under arm and top of arm 164  Shoulder internal rotation 64   Shoulder external rotation 85     (Blank rows = not tested)  CERVICAL AROM:     Percent limited  Flexion WNL, tight upper back/shoulders  Extension Dec 50%, increased pain neck, arms  Right lateral flexion Dec 20%, pain right neck/shoulder  Left lateral flexion Very tight/painful left UT  Right rotation Decreased 60%, pain right neck  Left rotation WFL, no pain    UPPER EXTREMITY STRENGTH: GRIP #2: Left 35,27              Right  8,5           Pincer grip Left  3,3               Right 2,1  LYMPHEDEMA ASSESSMENTS:   SURGERY TYPE/DATE:02/27/2022 Bilateral breast lumpectomies,  12/25/2022 Bilateral Mastopexy  NUMBER OF LYMPH NODES REMOVED: Left 0/1 incidental LN Right 0 LN's  CHEMOTHERAPY: NO  RADIATION:Adjuvant ended 05/08/2022   HORMONE TREATMENT: NO  INFECTIONS: NO    GRIP: left 35,27  right  8,5 Pincer grip  3,3                   Right 2,1   FUNCTIONAL TESTS:    GAIT: WNL    QUICK DASH SURVEY: 70%                                                                                                                            TREATMENT DATE:   04/30/2023 Reviewed standing theraband with yellow ;scapular retraction, extension, bilateral ER all x 10 Supine horizontal abd x 10, diagonals x 5 ea supine STM to bilateral posterior cervicals, UT, levator, and in SL to bilateral  scapular, lats, levator areas with cocoa butter Suboccipital release, manual traction with prolonged hold Supine cervical retractions into therapists hands x 10  04/27/2023 Standing Yellow band: rows 10x Standing yellow band: bil shoulder extension 10x Standing yellow band: external rotation 10x Manual therapy: cervical and periscapular musculature Trigger Point Dry Needling Subsequent Treatment: Pt instructed on Dry Needling rational, procedures, and possible side effects. Pt instructed to expect mild to moderate muscle soreness later in the day and/or into the next day.  Pt instructed in methods to reduce muscle soreness. Pt instructed to continue prescribed HEP. Because Dry Needling was performed over or adjacent to a lung field, pt was educated on S/S of pneumothorax and to seek immediate medical attention should they occur.  Patient was educated on signs and symptoms of infection and other risk factors and advised to seek medical attention should they occur.  Patient verbalized understanding of these instructions and education.   Patient Verbal Consent Given: Yes Education Handout Provided: Yes Muscles Treated:  bil rhomboids, bil upper trap, subscapularis and levators  Electrical Stimulation Performed: No Treatment Response/Outcome: twitch and improved tissue mobility   Elongation and release after dry needling   04/26/2023 Cervical rotations and SB x 5 ea Cervical retractions x 10 Scapular retractions x 10 Pectoral stretch in corner 4 x 20 sec STM to bilateral posterior cervicals, UT, levator, and in SL to bilateral scapular areas with cocoa butter Suboccipital release, manual traction with prolonged hold Supine cervical retractions into therapists hands x 10  04/23/2023 Cervical rotations and SB x 5 ea Cervical retractions x 10 Scapular retractions x 10 Pectoral stretch in corner 3 x 20 sec STM to bilateral posterior cervicals, UT, levator, and in SL to bilateral scapular  areas with cocoa butter Suboccipital release, manual traction with prolonged hold Supine cervical retractions into therapists hands x 10 Educated pt in supine to and from sit 04/20/2023 Reviewed wall slides x 5, stargazer x 5 and supine shoulder flexion for pt to continue doing at home Bilateral pectoral stretches in doorway x 3 ea Manual traction, sub occipital release STM to bilateral posterior cervicals, UT, levator, and in SL to bilateral scapular areas with cocoa butter Suboccipital release, manual traction with prolonged hold Supine cervical retractions into therapists hands x 10 Measured AROM bilateral shoulders  04/19/2023 Cervical ROM Bilateral rot x 5 , SB x 5, Cervical retraction 2 x 5 all without increased pain and with  increased ROM STM to bilateral posterior cervicals, UT, levator, with cocoa butter, and in SL to bilateral scapular areas with cocoa butter Suboccipital release, manual traction with prolonged hold  04/17/23 Discussion regarding exercises-cervical retraction, scap squeezes, levator/upper trap stretch Trigger Point Dry Needling  Initial Treatment: Pt instructed on Dry Needling rational, procedures, and possible side effects. Pt instructed to expect mild to moderate muscle soreness later in the day and/or into the next day.  Pt instructed in methods to reduce muscle soreness. Pt instructed to continue prescribed HEP. Because Dry Needling was performed over or adjacent to a lung field, pt was educated on S/S of pneumothorax and to seek immediate medical attention should they occur.  Patient was educated on signs and symptoms of infection and other risk factors and advised to seek medical attention should they occur.  Patient verbalized understanding of these instructions and education.   Patient Verbal Consent Given: Yes Education Handout Provided: Yes Muscles Treated: Bil cervical multifidi, bil rhomboids, bil upper trap and levators  Electrical Stimulation  Performed: No Treatment Response/Outcome: twitch and improved tissue mobility   Elongation and release after dry needling  04/13/2023 Discussed importance of proper posture and using towel roll for sitting to maintain more erect posture. Encouraged to try scapular retractions to stretch chest muscles some and to use her traction every day for 10-15 min if she is getting good relief with it. Also reminded about cervical retractions and had her demonstrate. Discussed checking pain first and after several repetitions, and discontinuing if pain is worse. Discussed LOS, POC and treatment interventions.      PATIENT EDUCATION:  Education details: see above 04/13/2023 Person educated: Patient Education method: Explanation Education comprehension: verbalized understanding and returned demonstration  HOME EXERCISE PROGRAM: Access Code: [email or web address] URL: [email or web address] Date: 04/17/2023 Prepared by: Tresa Endo  Exercises - Sternocleidomastoid Stretch  - 2 x daily - 7 x weekly - 1 sets - 3 reps - 30 hold - Gentle Levator Scapulae Stretch (Mirrored)  - 1 x daily - 7 x weekly - 1 sets - 3 reps - 30 hold - Seated Rhomboid Stretch  - 1 x daily - 7 x weekly - 1 sets - 3 reps - 30 hold - Radial Nerve Flossing (Mirrored)  - 1 x daily - 7 x weekly - 1 sets - 10 reps - Median Nerve Flossing  - 1 x daily - 7 x weekly - 1 sets - 10 reps  ASSESSMENT:  CLINICAL IMPRESSION:  Multiple tender points and shortened areas in right scapular area/lats and SCM greater than left. Required a few VC's, TC's for proper performance of theraband exercises.  Overall pain much better since Dry needling on Friday.    OBJECTIVE IMPAIRMENTS: decreased activity tolerance, decreased knowledge of condition, decreased ROM, decreased strength, impaired sensation, impaired UE functional use, postural dysfunction, and pain.   ACTIVITY LIMITATIONS: carrying, lifting, sleeping, bed mobility, dressing, and reach over  head  PARTICIPATION LIMITATIONS: cleaning, laundry, driving, shopping, and community activity  PERSONAL FACTORS: 3+ comorbidities: Right breast cancer s/p radiation, chronic pain  are also affecting patient's functional outcome.   REHAB POTENTIAL: Good  CLINICAL DECISION MAKING: Evolving/moderate complexity  EVALUATION COMPLEXITY: Moderate  GOALS: Goals reviewed with patient? Yes  SHORT TERM GOALS: Target date: 05/04/2023  Pt will be independent with HEP for ROM and strengthening of neck and shoulder Baseline: Goal status:  MET 04/23/2023 2.  Pt will report overall pain improved by 30% Baseline:  Goal status: MET 04/23/2023 3.  Bilateral shoulder flexion and abduction will be improved to atleast 140 degrees for improved reaching Baseline:  Goal status: MET 04/20/2023 4.  Pt will have quick dash improved by 20 points or more to demonstrate improved function Baseline: 70 Goal status: INITIAL  5.  Right grip strength will improve to 15  lbs for improved ability to perform home activities Baseline:  Goal status: INITIAL   LONG TERM GOALS: Target date: 05/25/2023  Pt will have bilateral shoulder flexion and abd 150 degrees for improved reaching Baseline:  Goal status: Met 04/23/2023 2.  Pt will report overall pain 60% improved or better Baseline:  Goal status: INITIAL  3.  Pt will have right grip strength increased to 20 lbs or more for improved ability to open jars. Baseline:  Goal status: INITIAL  4.  Pt will have quick dash no greater than 20% to demonstrate improved function Baseline:  Goal status: INITIAL  5.  Pt will be able to sleep more comfortably without increased neck pain Baseline:  Goal status: INITIAL   PLAN:  PT FREQUENCY: 3x/week  PT DURATION: 6 weeks   PLANNED INTERVENTIONS: 97164- PT Re-evaluation, 97110-Therapeutic exercises, 97530- Therapeutic activity, 97112- Neuromuscular re-education, 97535- Self Care, 16109- Manual therapy, 97760- Orthotic  Fit/training, and Dry Needling  PLAN FOR NEXT SESSION: STM to UT, levator, post. cervicals, scap region,manual traction, instruct supine clasped hands flex or supine wand for flex, scaption, stargazer, wall slides, progress ROM as tolerated then strength, postural education,review home traction prn, DN as prior 1x/week  Lavinia Sharps, PT 04/30/23 8:55 AM Phone: [phone] Fax: [phone]

## 2023-05-02 ENCOUNTER — Ambulatory Visit: Attending: Pain Medicine

## 2023-05-02 DIAGNOSIS — G8929 Other chronic pain: Secondary | ICD-10-CM | POA: Diagnosis present

## 2023-05-02 DIAGNOSIS — Z170 Estrogen receptor positive status [ER+]: Secondary | ICD-10-CM | POA: Insufficient documentation

## 2023-05-02 DIAGNOSIS — M25612 Stiffness of left shoulder, not elsewhere classified: Secondary | ICD-10-CM | POA: Insufficient documentation

## 2023-05-02 DIAGNOSIS — C50411 Malignant neoplasm of upper-outer quadrant of right female breast: Secondary | ICD-10-CM | POA: Diagnosis present

## 2023-05-02 DIAGNOSIS — M25611 Stiffness of right shoulder, not elsewhere classified: Secondary | ICD-10-CM | POA: Diagnosis present

## 2023-05-02 DIAGNOSIS — R293 Abnormal posture: Secondary | ICD-10-CM | POA: Insufficient documentation

## 2023-05-02 DIAGNOSIS — Z923 Personal history of irradiation: Secondary | ICD-10-CM | POA: Diagnosis present

## 2023-05-02 DIAGNOSIS — M7918 Myalgia, other site: Secondary | ICD-10-CM | POA: Insufficient documentation

## 2023-05-02 DIAGNOSIS — M542 Cervicalgia: Secondary | ICD-10-CM | POA: Diagnosis present

## 2023-05-02 NOTE — Therapy (Signed)
 OUTPATIENT PHYSICAL THERAPY  TREATMENT  Patient Name: Christie Fox MRN: [id number] DOB:02-19-1955, 68 y.o., female Today's Date: 05/02/2023  END OF SESSION:  PT End of Session - 05/02/23 0754     Visit Number 9    Number of Visits 18    Date for PT Re-Evaluation 05/25/23    Authorization Type [employer]    Authorization Time Period 04/13/2023-05/25/2023    Authorization - Visit Number 9    Authorization - Number of Visits 18    PT Start Time 0800    PT Stop Time 0850    PT Time Calculation (min) 50 min    Activity Tolerance Patient tolerated treatment well    Behavior During Therapy WFL for tasks assessed/performed              Past Medical History:  Diagnosis Date   Anxiety    Arthritis    knees   At risk for sleep apnea    STOP-BANG= 4    SENT TO PCP 07-31-2013   Borderline diabetes    diet controlled   Breast cancer (HCC)    Dysuria    History of kidney stones    Hyperlipidemia    Hypertension    Hypothyroidism    Migraines    PONV (postoperative nausea and vomiting)    "please call me Nioma to wake me up , not Mrs Yankey"    Renal calculus, left    Urgency of urination    Past Surgical History:  Procedure Laterality Date   BREAST LUMPECTOMY WITH RADIOACTIVE SEED LOCALIZATION Right 02/27/2022   Procedure: RIGHT BREAST LUMPECTOMY WITH RADIOACTIVE SEED LOCALIZATION X3;  Surgeon: Emelia Loron, MD;  Location: [facility];  Service: General;  Laterality: Right;   CESAREAN SECTION  x2   CYSTOSCOPY WITH RETROGRADE PYELOGRAM, URETEROSCOPY AND STENT PLACEMENT Left 08/04/2013   Procedure: 1ST STAGE CYSTOSCOPY WITH RETROGRADE PYELOGRAM, URETEROSCOPY AND STENT PLACEMENT;  Surgeon: Sebastian Ache, MD;  Location: [facility];  Service: Urology;  Laterality: Left;   CYSTOSCOPY WITH RETROGRADE PYELOGRAM, URETEROSCOPY AND STENT PLACEMENT Left 08/20/2013   Procedure: 2ND STAGE CYSTOSCOPY WITH RETROGRADE PYELOGRAM, URETEROSCOPY BASKET STONES AND STENT EXCHANGE, BLOOD  MOP;  Surgeon: Sebastian Ache, MD;  Location: [facility];  Service: Urology;  Laterality: Left;   EXPLORATORY LAPAROTOMY W/ BILATERAL SALPINOOPHORECTOMY  11-27-2001   HOLMIUM LASER APPLICATION Left 08/04/2013   Procedure: HOLMIUM LASER APPLICATION;  Surgeon: Sebastian Ache, MD;  Location: [facility];  Service: Urology;  Laterality: Left;   LAPAROSCOPIC GASTRIC SLEEVE RESECTION N/A 03/27/2017   Procedure: LAPAROSCOPIC GASTRIC SLEEVE RESECTION WITH UPPER ENDO;  Surgeon: Ovidio Kin, MD;  Location: [facility];  Service: General;  Laterality: N/A;   LEFT URETEROSCOPIC LASER LITHO STONE EXTRACTION AND STENT PLACEMENT  03-25-2005   LUMBAR FUSION  X2   LAST ONE 2000   MASTOPEXY Bilateral 12/25/2022   Procedure: MASTOPEXY;  Surgeon: Glenna Fellows, MD;  Location: [facility];  Service: Plastics;  Laterality: Bilateral;   ORIF LEFT ANKLE FX  03-20-2000   RETAINED HARDWARE   RADIOACTIVE SEED GUIDED EXCISIONAL BREAST BIOPSY Left 02/27/2022   Procedure: RADIOACTIVE SEED GUIDED EXCISIONAL LEFT BREAST BIOPSY;  Surgeon: Emelia Loron, MD;  Location: [facility];  Service: General;  Laterality: Left;   VAGINAL HYSTERECTOMY  1999   WISDOM TOOTH EXTRACTION     Patient Active Problem List   Diagnosis Date Noted   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) 12/30/2021  Hyperlipidemia 08/19/2014   Hypertension 08/19/2014   Hypothyroidism 08/19/2014   Primary osteoarthritis of both knees 08/19/2014      REFERRING PROVIDER: Mancel Parsons, MD  REFERRING DIAG: Chronic musculoskeletal Pain  THERAPY DIAG:  Malignant neoplasm of upper-outer quadrant of right breast in female, estrogen receptor positive (HCC)  Cervicalgia  Chronic musculoskeletal pain  Stiffness of right shoulder, not elsewhere classified  Stiffness of left shoulder, not elsewhere classified  Abnormal posture  ONSET DATE: January 2025  Rationale for Evaluation and  Treatment: Rehabilitation  SUBJECTIVE:                                                                                                                                                                                           SUBJECTIVE STATEMENT:  I have had better days. I haven't done exercises yet today. I am still frustrated with my fingers and my hands are swollen. My upper back hurts today for some reason. I had to get rid of my puppy because she bit my grand daughter and she had to have 4 stitches in her face.   From evaluation: Pain is all back in my bilateral UT with pain down both arms and into hands right greater than left. My Right posterior shoulder is killing me. I have knots everywhere. I have to run my hands under the water because they are numb, and I get needles in the tips of my fingers. I can't hold a pencil.  I am doing all of my exercises, and use my traction every other day and I can feel the feeling start coming back in my right hand. I get relief for the rest of the afternoon until I go to bed. Laying flat makes everything worse. I bought a husband pillow to sleep at night so my arms are on the arms rest and my head and back are elevated. I let hot water beat on me and that helps. They put me on 20 mg prednisone on Feb 28. I don't see a lot of difference with it. The muscle relaxers help at night, but I don't think the gabapentin does much. She has had some ablations of the genicular nerves in her knee that helped for several weeks, but the pain is back again. My right knuckles hurt. I had bilateral breast lifts 12/24/2022. No problems after that with ROM   PERTINENT HISTORY:  Pt with pain involving her neck and right shoulder, right arm, and right hand greater than left. Patient has a history significant for breast cancer with bilateral Lumpectomies; Right for DCIS, left with a Complex Sclerosing Lesion (Benign) and 0/1 incidental nodes. She  had Adjuvant radiation on the right  ending on 05/08/2022. Neck pain started 1-3 months after radiation.  Pt had bilateral breast lifts on 12/24/2022 and she has been doing her exercises since then.Neck pain is sharp, stabbing, aching and constant. Noted to have Diffuse DDD, Trace anterolisthesis at C3-C4 and C4-C5, and 2-3 mm anterolisthesis at C7-T1. Trace retrolisthesis at C6-C7,. Multilevel degenerative disc changes with disc space height loss most pronounced and moderate to severe at C5-C6 and C6-C7,Multilevel uncovertebral spurring and facet arthropathy throughout cervical spine.   PAIN:  Are you having pain? Yes NPRS scale: 2-3/10  Pain location: Greatest at right neck, arm and hand Pain orientation: Right  PAIN TYPE: aching, burning, sharp, throbbing, and tingling Pain description: constant  Aggravating factors: sleeping, not sure Relieving factors: traction, warm water running on me  PRECAUTIONS: Prior foot ,ankle (rods and pins) Left LE swelling,, back surgery, gastric sleeve, hysterectomy, pain in bilateral knees   RED FLAGS: Bowel or bladder incontinence: Yes:      WEIGHT BEARING RESTRICTIONS: No  FALLS:  Has patient fallen in last 6 months? 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  COGNITION: Overall cognitive status: Within functional limits for tasks assessed   PALPATION: Tender to palpation bilateral UT,levator, pectorals, posterior cervicals right greater than left  OBSERVATIONS / OTHER ASSESSMENTS: Mild redness dorsum of right hand, pt postures with scapular protraction, and UT elevation  SENSATION: Light touch: Deficits all fingers of right hand with decreased/altered sensation and fingertips of both  hands    POSTURE: forward head, rounded shoulders, UT elevation  UPPER EXTREMITY AROM/PROM:  A/PROM RIGHT   eval  RIGHT 04/20/2023  Shoulder extension 53, pain post shoulder    Shoulder flexion 132, pain top and under arm 160  Shoulder abduction 120, pain chest and under arm 168  Shoulder internal rotation 63   Shoulder external rotation 80, under arm, chest, shoulder     (Blank rows = not tested)  A/PROM LEFT   eval LEFT 04/20/2023  Shoulder extension 55   Shoulder flexion 132 163  Shoulder abduction 132, pain under arm and top of arm 164  Shoulder internal rotation 64   Shoulder external rotation 85     (Blank rows = not tested)  CERVICAL AROM:     Percent limited  Flexion WNL, tight upper back/shoulders  Extension Dec 50%, increased pain neck, arms  Right lateral flexion Dec 20%, pain right neck/shoulder  Left lateral flexion Very tight/painful left UT  Right rotation Decreased 60%, pain right neck  Left rotation WFL, no pain    UPPER EXTREMITY STRENGTH: GRIP #2: Left 35,27              Right  8,5           Pincer grip Left  3,3               Right 2,1  LYMPHEDEMA ASSESSMENTS:   SURGERY TYPE/DATE:02/27/2022 Bilateral breast lumpectomies,  12/25/2022 Bilateral Mastopexy  NUMBER OF LYMPH NODES REMOVED: Left 0/1 incidental LN Right 0 LN's  CHEMOTHERAPY: NO  RADIATION:Adjuvant ended 05/08/2022   HORMONE TREATMENT: NO  INFECTIONS: NO    GRIP: left 35,27  right  8,5 Pincer grip  3,3                   Right 2,1   FUNCTIONAL TESTS:    GAIT: WNL    QUICK DASH SURVEY: 70%                                                                                                                            TREATMENT DATE:  05/02/2023 Fingers in median nerve distribution get numb and tingly, and top of right hand.  When she does the traction her hands are temporarily better. STM to bilateral posterior cervicals, UT, levator, and in SL to bilateral  scapular, lats, levator areas with cocoa butter Suboccipital release, manual traction with prolonged hold Supine cervical retractions into therapists hands x 10 Supine scapular series with yellow band scaption, horizontal abd, ER, sword with thumb up  B all x 5   04/30/2023 Reviewed standing theraband with yellow ;scapular retraction, extension, bilateral ER all x 10 Supine horizontal abd x 10, diagonals x 5 ea supine STM to bilateral posterior cervicals, UT, levator, and in SL to bilateral scapular, lats, levator areas with cocoa butter Suboccipital release, manual traction with prolonged hold Supine cervical retractions into therapists hands x 10  04/27/2023 Standing Yellow band: rows 10x Standing yellow band: bil shoulder extension 10x Standing yellow band: external rotation 10x Manual therapy: cervical and periscapular musculature Trigger Point Dry Needling Subsequent Treatment: Pt instructed on Dry Needling rational, procedures, and possible side effects. Pt instructed to expect mild to moderate muscle soreness later in the day and/or into the next day.  Pt instructed in methods to reduce muscle soreness. Pt instructed to continue prescribed HEP. Because Dry Needling was performed over or adjacent to a lung field, pt was educated on S/S of pneumothorax and to seek immediate medical attention should they occur.  Patient was educated on signs and symptoms of infection and other risk factors and advised to seek medical attention should they occur.  Patient verbalized understanding of these instructions and education.   Patient Verbal Consent Given: Yes Education Handout Provided: Yes Muscles Treated:  bil rhomboids, bil upper trap, subscapularis and levators  Electrical Stimulation Performed: No Treatment Response/Outcome: twitch and improved tissue mobility   Elongation and release after dry needling   04/26/2023 Cervical rotations and SB x 5 ea Cervical retractions x  10 Scapular retractions x 10 Pectoral stretch in corner 4 x 20 sec STM to bilateral posterior cervicals, UT, levator, and in SL to bilateral scapular areas with cocoa butter Suboccipital release, manual traction with prolonged hold Supine cervical retractions into therapists hands x 10  04/23/2023 Cervical rotations and SB x 5 ea Cervical retractions x 10 Scapular retractions x 10 Pectoral stretch in corner 3 x 20 sec STM to bilateral posterior cervicals, UT, levator, and in SL to bilateral scapular areas with cocoa butter Suboccipital release, manual traction with prolonged hold Supine cervical retractions into therapists  hands x 10 Educated pt in supine to and from sit 04/20/2023 Reviewed wall slides x 5, stargazer x 5 and supine shoulder flexion for pt to continue doing at home Bilateral pectoral stretches in doorway x 3 ea Manual traction, sub occipital release STM to bilateral posterior cervicals, UT, levator, and in SL to bilateral scapular areas with cocoa butter Suboccipital release, manual traction with prolonged hold Supine cervical retractions into therapists hands x 10 Measured AROM bilateral shoulders 04/19/2023 Cervical ROM Bilateral rot x 5 , SB x 5, Cervical retraction 2 x 5 all without increased pain and with  increased ROM STM to bilateral posterior cervicals, UT, levator, with cocoa butter, and in SL to bilateral scapular areas with cocoa butter Suboccipital release, manual traction with prolonged hold  04/17/23 Discussion regarding exercises-cervical retraction, scap squeezes, levator/upper trap stretch Trigger Point Dry Needling  Initial Treatment: Pt instructed on Dry Needling rational, procedures, and possible side effects. Pt instructed to expect mild to moderate muscle soreness later in the day and/or into the next day.  Pt instructed in methods to reduce muscle soreness. Pt instructed to continue prescribed HEP. Because Dry Needling was performed over or  adjacent to a lung field, pt was educated on S/S of pneumothorax and to seek immediate medical attention should they occur.  Patient was educated on signs and symptoms of infection and other risk factors and advised to seek medical attention should they occur.  Patient verbalized understanding of these instructions and education.   Patient Verbal Consent Given: Yes Education Handout Provided: Yes Muscles Treated: Bil cervical multifidi, bil rhomboids, bil upper trap and levators  Electrical Stimulation Performed: No Treatment Response/Outcome: twitch and improved tissue mobility   Elongation and release after dry needling  04/13/2023 Discussed importance of proper posture and using towel roll for sitting to maintain more erect posture. Encouraged to try scapular retractions to stretch chest muscles some and to use her traction every day for 10-15 min if she is getting good relief with it. Also reminded about cervical retractions and had her demonstrate. Discussed checking pain first and after several repetitions, and discontinuing if pain is worse. Discussed LOS, POC and treatment interventions.      PATIENT EDUCATION:  Education details: see above 04/13/2023 Person educated: Patient Education method: Explanation Education comprehension: verbalized understanding and returned demonstration  HOME EXERCISE PROGRAM: Access Code: [email or web address] URL: [email or web address] Date: 04/17/2023 Prepared by: Tresa Endo  Exercises - Sternocleidomastoid Stretch  - 2 x daily - 7 x weekly - 1 sets - 3 reps - 30 hold - Gentle Levator Scapulae Stretch (Mirrored)  - 1 x daily - 7 x weekly - 1 sets - 3 reps - 30 hold - Seated Rhomboid Stretch  - 1 x daily - 7 x weekly - 1 sets - 3 reps - 30 hold - Radial Nerve Flossing (Mirrored)  - 1 x daily - 7 x weekly - 1 sets - 10 reps - Median Nerve Flossing  - 1 x daily - 7 x weekly - 1 sets - 10 reps  ASSESSMENT:  CLINICAL IMPRESSION:  Pain continues to  be improved however, pt continues to have frustration with the numbness in her hands. Hand discomfort improves after traction which pt is doing daily at home. Increased tissue tension remains especially at Right interscapular region.   OBJECTIVE IMPAIRMENTS: decreased activity tolerance, decreased knowledge of condition, decreased ROM, decreased strength, impaired sensation, impaired UE functional use, postural dysfunction, and pain.   ACTIVITY LIMITATIONS: carrying, lifting, sleeping, bed mobility,  dressing, and reach over head  PARTICIPATION LIMITATIONS: cleaning, laundry, driving, shopping, and community activity  PERSONAL FACTORS: 3+ comorbidities: Right breast cancer s/p radiation, chronic pain  are also affecting patient's functional outcome.   REHAB POTENTIAL: Good  CLINICAL DECISION MAKING: Evolving/moderate complexity  EVALUATION COMPLEXITY: Moderate  GOALS: Goals reviewed with patient? Yes  SHORT TERM GOALS: Target date: 05/04/2023  Pt will be independent with HEP for ROM and strengthening of neck and shoulder Baseline: Goal status: MET 04/23/2023 2.  Pt will report overall pain improved by 30% Baseline:  Goal status: MET 04/23/2023 3.  Bilateral shoulder flexion and abduction will be improved to atleast 140 degrees for improved reaching Baseline:  Goal status: MET 04/20/2023 4.  Pt will have quick dash improved by 20 points or more to demonstrate improved function Baseline: 70 Goal status: INITIAL  5.  Right grip strength will improve to 15  lbs for improved ability to perform home activities Baseline:  Goal status: INITIAL   LONG TERM GOALS: Target date: 05/25/2023  Pt will have bilateral shoulder flexion and abd 150 degrees for improved reaching Baseline:  Goal status: Met 04/23/2023 2.  Pt will report overall pain 60% improved or better Baseline:  Goal status: INITIAL  3.  Pt will have right grip strength increased to 20 lbs or more for improved ability to open  jars. Baseline:  Goal status: INITIAL  4.  Pt will have quick dash no greater than 20% to demonstrate improved function Baseline:  Goal status: INITIAL  5.  Pt will be able to sleep more comfortably without increased neck pain Baseline:  Goal status: INITIAL   PLAN:  PT FREQUENCY: 3x/week  PT DURATION: 6 weeks   PLANNED INTERVENTIONS: 97164- PT Re-evaluation, 97110-Therapeutic exercises, 97530- Therapeutic activity, 97112- Neuromuscular re-education, 97535- Self Care, 65784- Manual therapy, 97760- Orthotic Fit/training, and Dry Needling  PLAN FOR NEXT SESSION: STM to UT, levator, post. cervicals, scap region,manual traction, stargazer, wall slides, progress ROM as tolerated then strength, postural education,review home traction prn, DN as prior 1x/week  Lavinia Sharps, PT 05/02/23 8:55 AM Phone: [phone] Fax: [phone]

## 2023-05-04 ENCOUNTER — Ambulatory Visit: Admitting: Physical Therapy

## 2023-05-04 DIAGNOSIS — M542 Cervicalgia: Secondary | ICD-10-CM

## 2023-05-04 DIAGNOSIS — G8929 Other chronic pain: Secondary | ICD-10-CM

## 2023-05-04 DIAGNOSIS — C50411 Malignant neoplasm of upper-outer quadrant of right female breast: Secondary | ICD-10-CM | POA: Diagnosis not present

## 2023-05-04 DIAGNOSIS — Z170 Estrogen receptor positive status [ER+]: Secondary | ICD-10-CM

## 2023-05-04 NOTE — Therapy (Signed)
 OUTPATIENT PHYSICAL THERAPY  TREATMENT  Patient Name: Christie Fox MRN: [id number] DOB:1955-02-28, 68 y.o., female Today's Date: 05/04/2023  Progress Note Reporting Period 04/13/23 to 05/04/23  See note below for Objective Data and Assessment of Progress/Goals.     END OF SESSION:  PT End of Session - 05/04/23 0937     Visit Number 10    Number of Visits 18    Date for PT Re-Evaluation 05/25/23    Authorization Type [employer]    Authorization Time Period 04/13/2023-05/25/2023    Authorization - Visit Number 10    Progress Note Due on Visit 20    PT Start Time 0934    PT Stop Time 1015    PT Time Calculation (min) 41 min    Activity Tolerance Patient tolerated treatment well              Past Medical History:  Diagnosis Date   Anxiety    Arthritis    knees   At risk for sleep apnea    STOP-BANG= 4    SENT TO PCP 07-31-2013   Borderline diabetes    diet controlled   Breast cancer (HCC)    Dysuria    History of kidney stones    Hyperlipidemia    Hypertension    Hypothyroidism    Migraines    PONV (postoperative nausea and vomiting)    "please call me Krisanne to wake me up , not Mrs Sachdeva"    Renal calculus, left    Urgency of urination    Past Surgical History:  Procedure Laterality Date   BREAST LUMPECTOMY WITH RADIOACTIVE SEED LOCALIZATION Right 02/27/2022   Procedure: RIGHT BREAST LUMPECTOMY WITH RADIOACTIVE SEED LOCALIZATION X3;  Surgeon: Emelia Loron, MD;  Location: [facility];  Service: General;  Laterality: Right;   CESAREAN SECTION  x2   CYSTOSCOPY WITH RETROGRADE PYELOGRAM, URETEROSCOPY AND STENT PLACEMENT Left 08/04/2013   Procedure: 1ST STAGE CYSTOSCOPY WITH RETROGRADE PYELOGRAM, URETEROSCOPY AND STENT PLACEMENT;  Surgeon: Sebastian Ache, MD;  Location: [facility];  Service: Urology;  Laterality: Left;   CYSTOSCOPY WITH RETROGRADE PYELOGRAM, URETEROSCOPY AND STENT PLACEMENT Left 08/20/2013   Procedure: 2ND STAGE CYSTOSCOPY WITH RETROGRADE  PYELOGRAM, URETEROSCOPY BASKET STONES AND STENT EXCHANGE, BLOOD MOP;  Surgeon: Sebastian Ache, MD;  Location: [facility];  Service: Urology;  Laterality: Left;   EXPLORATORY LAPAROTOMY W/ BILATERAL SALPINOOPHORECTOMY  11-27-2001   HOLMIUM LASER APPLICATION Left 08/04/2013   Procedure: HOLMIUM LASER APPLICATION;  Surgeon: Sebastian Ache, MD;  Location: [facility];  Service: Urology;  Laterality: Left;   LAPAROSCOPIC GASTRIC SLEEVE RESECTION N/A 03/27/2017   Procedure: LAPAROSCOPIC GASTRIC SLEEVE RESECTION WITH UPPER ENDO;  Surgeon: Ovidio Kin, MD;  Location: [facility];  Service: General;  Laterality: N/A;   LEFT URETEROSCOPIC LASER LITHO STONE EXTRACTION AND STENT PLACEMENT  03-25-2005   LUMBAR FUSION  X2   LAST ONE 2000   MASTOPEXY Bilateral 12/25/2022   Procedure: MASTOPEXY;  Surgeon: Glenna Fellows, MD;  Location: [facility];  Service: Plastics;  Laterality: Bilateral;   ORIF LEFT ANKLE FX  03-20-2000   RETAINED HARDWARE   RADIOACTIVE SEED GUIDED EXCISIONAL BREAST BIOPSY Left 02/27/2022   Procedure: RADIOACTIVE SEED GUIDED EXCISIONAL LEFT BREAST BIOPSY;  Surgeon: Emelia Loron, MD;  Location: [facility];  Service: General;  Laterality: Left;   VAGINAL HYSTERECTOMY  1999   WISDOM TOOTH EXTRACTION     Patient Active Problem List   Diagnosis Date Noted   Malignant  neoplasm of upper outer 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) 12/30/2021   Hyperlipidemia 08/19/2014   Hypertension 08/19/2014   Hypothyroidism 08/19/2014   Primary osteoarthritis of both knees 08/19/2014      REFERRING PROVIDER: Mancel Parsons, MD  REFERRING DIAG: Chronic musculoskeletal Pain  THERAPY DIAG:  Cervicalgia  Malignant neoplasm of upper-outer quadrant of right breast in female, estrogen receptor positive (HCC)  Chronic musculoskeletal pain  ONSET DATE: January 2025  Rationale for Evaluation and Treatment: Rehabilitation  SUBJECTIVE:                                                                                                                                                                                            SUBJECTIVE STATEMENT:  I'm much better.  I was an 8/10 now a 2/10.  Improved cervical rotation for looking behind.  The numbness in my fingers wakes me up at night and they stay numb all day.   From evaluation: Pain is all back in my bilateral UT with pain down both arms and into hands right greater than left. My Right posterior shoulder is killing me. I have knots everywhere. I have to run my hands under the water because they are numb, and I get needles in the tips of my fingers. I can't hold a pencil.  I am doing all of my exercises, and use my traction every other day and I can feel the feeling start coming back in my right hand. I get relief for the rest of the afternoon until I go to bed. Laying flat makes everything worse. I bought a husband pillow to sleep at night so my arms are on the arms rest and my head and back are elevated. I let hot water beat on me and that helps. They put me on 20 mg prednisone on Feb 28. I don't see a lot of difference with it. The muscle relaxers help at night, but I don't think the gabapentin does much. She has had some ablations of the genicular nerves in her knee that helped for several weeks, but the pain is back again. My right knuckles hurt. I had bilateral breast lifts 12/24/2022. No problems after that with ROM   PERTINENT HISTORY:  Pt with pain involving her neck and right shoulder, right arm, and right hand greater than left. Patient has a history significant for breast cancer with bilateral Lumpectomies; Right for DCIS, left with a Complex Sclerosing Lesion (Benign) and 0/1 incidental nodes. She had Adjuvant radiation on the right ending on 05/08/2022. Neck pain started 1-3 months after radiation.  Pt had bilateral breast lifts on 12/24/2022 and she has  been doing her exercises since then.Neck pain is sharp,  stabbing, aching and constant. Noted to have Diffuse DDD, Trace anterolisthesis at C3-C4 and C4-C5, and 2-3 mm anterolisthesis at C7-T1. Trace retrolisthesis at C6-C7,. Multilevel degenerative disc changes with disc space height loss most pronounced and moderate to severe at C5-C6 and C6-C7,Multilevel uncovertebral spurring and facet arthropathy throughout cervical spine.   PAIN:  Are you having pain? Yes NPRS scale: 2/10  Pain location: Greatest at right neck, arm and hand Pain orientation: Right  PAIN TYPE: aching, burning, sharp, throbbing, and tingling Pain description: constant  Aggravating factors: sleeping, not sure Relieving factors: traction, warm water running on me  PRECAUTIONS: Prior foot ,ankle (rods and pins) Left LE swelling,, back surgery, gastric sleeve, hysterectomy, pain in bilateral knees   RED FLAGS: Bowel or bladder incontinence: Yes:      WEIGHT BEARING RESTRICTIONS: No  FALLS:  Has patient fallen in last 6 months? 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  COGNITION: Overall cognitive status: Within functional limits for tasks assessed   PALPATION: Tender to palpation bilateral UT,levator, pectorals, posterior cervicals right greater than left  OBSERVATIONS / OTHER ASSESSMENTS: Mild redness dorsum of right hand, pt postures with scapular protraction, and UT elevation  SENSATION: Light touch: Deficits all fingers of right hand with decreased/altered sensation and fingertips of both hands    POSTURE: forward head, rounded shoulders, UT elevation  UPPER EXTREMITY AROM/PROM:  A/PROM RIGHT   eval  RIGHT 04/20/2023 Right  4/4  Shoulder extension 53, pain  post shoulder     Shoulder flexion 132, pain top and under arm 160 160  Shoulder abduction 120, pain chest and under arm 168   Shoulder internal rotation 63    Shoulder external rotation 80, under arm, chest, shoulder      (Blank rows = not tested)  A/PROM LEFT   eval LEFT 04/20/2023 Left 4/4  Shoulder extension 55    Shoulder flexion 132 163 163  Shoulder abduction 132, pain under arm and top of arm 164   Shoulder internal rotation 64    Shoulder external rotation 85      (Blank rows = not tested)  CERVICAL AROM:     Percent limited 4/4  Flexion WNL, tight upper back/shoulders 55  Extension Dec 50%, increased pain neck, arms 46  Right lateral flexion Dec 20%, pain right neck/shoulder 48  Left lateral flexion Very tight/painful left UT 42  Right rotation Decreased 60%, pain right neck 55  Left rotation WFL, no pain 60    UPPER EXTREMITY STRENGTH: GRIP #2: Left 35,27              Right  8,5           Pincer grip Left  3,3               Right 2,1  LYMPHEDEMA ASSESSMENTS:   SURGERY TYPE/DATE:02/27/2022 Bilateral breast lumpectomies,  12/25/2022 Bilateral Mastopexy  NUMBER OF LYMPH NODES REMOVED: Left 0/1 incidental LN Right 0 LN's  CHEMOTHERAPY: NO  RADIATION:Adjuvant ended 05/08/2022   HORMONE TREATMENT: NO  INFECTIONS: NO    GRIP: left 35,27              right  8,5 Pincer grip  3,3                   Right 2,1   FUNCTIONAL TESTS:    GAIT: WNL    QUICK DASH SURVEY: 70% 4/4: 18.2%                                                                                                                            TREATMENT DATE:  05/04/2023 Quick DASH Cervical ROM Shoulder ROM Review of shoulder band ex's Manual therapy: cervical and periscapular musculature Trigger Point Dry Needling Subsequent Treatment: Pt instructed on Dry Needling rational, procedures, and possible side effects. Pt instructed to expect mild to moderate muscle soreness later in the day and/or  into the next day.  Pt instructed in methods to reduce muscle soreness. Pt instructed to continue prescribed HEP. Because Dry Needling was performed over or adjacent to a lung field, pt was educated on S/S of pneumothorax and to seek immediate medical attention should they occur.  Patient was educated on signs and symptoms of infection and other risk factors and advised to seek medical attention should they occur.  Patient verbalized understanding of these instructions and education.  Patient Verbal Consent Given: Yes Education Handout Provided: Yes Muscles Treated:  bil rhomboids, bil upper trap, bil levators; bil infraspinatus, bil teres Electrical Stimulation Performed: No Treatment Response/Outcome: twitch and improved tissue mobility   Elongation and release after dry needling  05/02/2023 Fingers in median nerve distribution get numb and tingly, and top of right hand.  When she does the traction her hands are temporarily better. STM to bilateral posterior cervicals, UT, levator, and in SL to bilateral scapular, lats, levator areas with cocoa butter Suboccipital release, manual traction with prolonged hold Supine cervical retractions into therapists hands x 10 Supine scapular series with yellow band scaption, horizontal abd, ER, sword with thumb up  B all x 5   04/30/2023 Reviewed standing theraband with yellow ;scapular retraction, extension, bilateral ER all x 10 Supine horizontal abd x 10, diagonals x 5 ea supine STM to bilateral posterior cervicals, UT, levator, and in SL to bilateral scapular, lats, levator areas with cocoa butter Suboccipital release, manual traction with prolonged hold Supine cervical retractions into therapists hands x 10  04/27/2023 Standing Yellow band: rows 10x Standing yellow band: bil shoulder extension 10x Standing yellow band: external rotation 10x Manual therapy: cervical and periscapular musculature Trigger Point Dry Needling Subsequent Treatment:  Pt instructed on Dry Needling rational, procedures, and possible side effects. Pt instructed to expect mild to moderate muscle soreness later in the day and/or into the next day.  Pt instructed in methods to reduce muscle soreness. Pt instructed to continue prescribed HEP. Because Dry Needling was performed over or adjacent to a lung field, pt was educated on S/S of pneumothorax and to seek immediate medical attention should they occur.  Patient was educated on signs and symptoms of infection and other risk factors  and advised to seek medical attention should they occur.  Patient verbalized understanding of these instructions and education.   Patient Verbal Consent Given: Yes Education Handout Provided: Yes Muscles Treated:  bil rhomboids, bil upper trap, subscapularis and levators  Electrical Stimulation Performed: No Treatment Response/Outcome: twitch and improved tissue mobility   Elongation and release after dry needling   04/26/2023 Cervical rotations and SB x 5 ea Cervical retractions x 10 Scapular retractions x 10 Pectoral stretch in corner 4 x 20 sec STM to bilateral posterior cervicals, UT, levator, and in SL to bilateral scapular areas with cocoa butter Suboccipital release, manual traction with prolonged hold Supine cervical retractions into therapists hands x 10  04/23/2023 Cervical rotations and SB x 5 ea Cervical retractions x 10 Scapular retractions x 10 Pectoral stretch in corner 3 x 20 sec STM to bilateral posterior cervicals, UT, levator, and in SL to bilateral scapular areas with cocoa butter Suboccipital release, manual traction with prolonged hold Supine cervical retractions into therapists hands x 10 Educated pt in supine to and from sit 04/20/2023 Reviewed wall slides x 5, stargazer x 5 and supine shoulder flexion for pt to continue doing at home Bilateral pectoral stretches in doorway x 3 ea Manual traction, sub occipital release STM to bilateral posterior  cervicals, UT, levator, and in SL to bilateral scapular areas with cocoa butter Suboccipital release, manual traction with prolonged hold Supine cervical retractions into therapists hands x 10 Measured AROM bilateral shoulders 04/19/2023 Cervical ROM Bilateral rot x 5 , SB x 5, Cervical retraction 2 x 5 all without increased pain and with  increased ROM STM to bilateral posterior cervicals, UT, levator, with cocoa butter, and in SL to bilateral scapular areas with cocoa butter Suboccipital release, manual traction with prolonged hold     PATIENT EDUCATION:  Education details: see above 04/13/2023 Person educated: Patient Education method: Explanation Education comprehension: verbalized understanding and returned demonstration  HOME EXERCISE PROGRAM: Access Code: [email or web address] URL: [email or web address] Date: 04/17/2023 Prepared by: Tresa Endo  Exercises - Sternocleidomastoid Stretch  - 2 x daily - 7 x weekly - 1 sets - 3 reps - 30 hold - Gentle Levator Scapulae Stretch (Mirrored)  - 1 x daily - 7 x weekly - 1 sets - 3 reps - 30 hold - Seated Rhomboid Stretch  - 1 x daily - 7 x weekly - 1 sets - 3 reps - 30 hold - Radial Nerve Flossing (Mirrored)  - 1 x daily - 7 x weekly - 1 sets - 10 reps - Median Nerve Flossing  - 1 x daily - 7 x weekly - 1 sets - 10 reps  ASSESSMENT:  CLINICAL IMPRESSION: Good improvements with cervical ROM particularly extension and right rotation since start of care.  Quick DASH functional outcome score much improved as well.  She is compliant with band ex's at home which improves prognosis. The patient benefits significantly from dry needling and manual therapy to stimulate underlying myofascial trigger points and muscular tissue for management of neuromusculoskeletal pain and address movement impairments.  Much improved soft tissue mobility and decreased tender point size and number following treatment session.  She would benefit from a continuation of  skilled PT for a return to the highest functional level possible with ADLs.     OBJECTIVE IMPAIRMENTS: decreased activity tolerance, decreased knowledge of condition, decreased ROM, decreased strength, impaired sensation, impaired UE functional use, postural dysfunction, and pain.   ACTIVITY LIMITATIONS: carrying, lifting, sleeping, bed mobility, dressing, and reach over head  PARTICIPATION LIMITATIONS: cleaning, laundry, driving, shopping, and community activity  PERSONAL FACTORS: 3+ comorbidities: Right breast cancer s/p radiation, chronic pain  are also affecting patient's functional outcome.   REHAB POTENTIAL: Good  CLINICAL DECISION MAKING: Evolving/moderate complexity  EVALUATION COMPLEXITY: Moderate  GOALS: Goals reviewed with patient? Yes  SHORT TERM GOALS: Target date: 05/04/2023  Pt will be independent with HEP for ROM and strengthening of neck and shoulder Baseline: Goal status: MET 04/23/2023 2.  Pt will report overall pain improved by 30% Baseline:  Goal status: MET 04/23/2023 3.  Bilateral shoulder flexion and abduction will be improved to atleast 140 degrees for improved reaching Baseline:  Goal status: MET 04/20/2023 4.  Pt will have quick dash improved by 20 points or more to demonstrate improved function Baseline: 70 Goal status: INITIAL  5.  Right grip strength will improve to 15  lbs for improved ability to perform home activities Baseline:  Goal status: INITIAL   LONG TERM GOALS: Target date: 05/25/2023  Pt will have bilateral shoulder flexion and abd 150 degrees for improved reaching Baseline:  Goal status: Met 04/23/2023 2.  Pt will report overall pain 60% improved or better Baseline:  Goal status: INITIAL  3.  Pt will have right grip strength increased to 20 lbs or more for improved ability to open jars. Baseline:  Goal status: INITIAL  4.  Pt will have quick dash no greater than 20% to demonstrate improved function Baseline:  Goal status: met  4/4  5.  Pt will be able to sleep more comfortably without increased neck pain Baseline:  Goal status: INITIAL   PLAN:  PT FREQUENCY: 3x/week  PT DURATION: 6 weeks   PLANNED INTERVENTIONS: 97164- PT Re-evaluation, 97110-Therapeutic exercises, 97530- Therapeutic activity, 97112- Neuromuscular re-education, 97535- Self Care, 96045- Manual therapy, 97760- Orthotic Fit/training, and Dry Needling  PLAN FOR NEXT SESSION: STM to UT, levator, post. cervicals, scap region,manual traction, stargazer, wall slides, progress ROM as tolerated then strength, postural education,review home traction prn, DN as prior 1x/week  Lavinia Sharps, PT 05/04/23 12:10 PM Phone: [phone] Fax: [phone]

## 2023-05-07 ENCOUNTER — Ambulatory Visit

## 2023-05-07 DIAGNOSIS — C50411 Malignant neoplasm of upper-outer quadrant of right female breast: Secondary | ICD-10-CM

## 2023-05-07 DIAGNOSIS — M25612 Stiffness of left shoulder, not elsewhere classified: Secondary | ICD-10-CM

## 2023-05-07 DIAGNOSIS — M542 Cervicalgia: Secondary | ICD-10-CM

## 2023-05-07 DIAGNOSIS — M7918 Myalgia, other site: Secondary | ICD-10-CM

## 2023-05-07 DIAGNOSIS — M25611 Stiffness of right shoulder, not elsewhere classified: Secondary | ICD-10-CM

## 2023-05-07 DIAGNOSIS — Z923 Personal history of irradiation: Secondary | ICD-10-CM

## 2023-05-07 DIAGNOSIS — R293 Abnormal posture: Secondary | ICD-10-CM

## 2023-05-07 NOTE — Therapy (Signed)
 OUTPATIENT PHYSICAL THERAPY  TREATMENT  Patient Name: Christie Fox MRN: [id number] DOB:[birth year], 68 y.o., female Today's Date: 05/07/2023      END OF SESSION:  PT End of Session - 05/07/23 1050     Visit Number 11    Number of Visits 18    Date for PT Re-Evaluation 05/25/23    Authorization Type [employer]    Authorization Time Period 04/13/2023-05/25/2023    Authorization - Visit Number 11    Authorization - Number of Visits 18    Progress Note Due on Visit 20    PT Start Time 1055    PT Stop Time 1152    PT Time Calculation (min) 57 min    Activity Tolerance Patient tolerated treatment well    Behavior During Therapy WFL for tasks assessed/performed              Past Medical History:  Diagnosis Date   Anxiety    Arthritis    knees   At risk for sleep apnea    STOP-BANG= 4    SENT TO PCP 07-31-2013   Borderline diabetes    diet controlled   Breast cancer (HCC)    Dysuria    History of kidney stones    Hyperlipidemia    Hypertension    Hypothyroidism    Migraines    PONV (postoperative nausea and vomiting)    "please call me Danita to wake me up , not Mrs Sonier"    Renal calculus, left    Urgency of urination    Past Surgical History:  Procedure Laterality Date   BREAST LUMPECTOMY WITH RADIOACTIVE SEED LOCALIZATION Right 02/27/2022   Procedure: RIGHT BREAST LUMPECTOMY WITH RADIOACTIVE SEED LOCALIZATION X3;  Surgeon: Emelia Loron, MD;  Location: [facility];  Service: General;  Laterality: Right;   CESAREAN SECTION  x2   CYSTOSCOPY WITH RETROGRADE PYELOGRAM, URETEROSCOPY AND STENT PLACEMENT Left 08/04/2013   Procedure: 1ST STAGE CYSTOSCOPY WITH RETROGRADE PYELOGRAM, URETEROSCOPY AND STENT PLACEMENT;  Surgeon: Sebastian Ache, MD;  Location: [facility];  Service: Urology;  Laterality: Left;   CYSTOSCOPY WITH RETROGRADE PYELOGRAM, URETEROSCOPY AND STENT PLACEMENT Left 08/20/2013   Procedure: 2ND STAGE CYSTOSCOPY WITH RETROGRADE PYELOGRAM,  URETEROSCOPY BASKET STONES AND STENT EXCHANGE, BLOOD MOP;  Surgeon: Sebastian Ache, MD;  Location: [facility];  Service: Urology;  Laterality: Left;   EXPLORATORY LAPAROTOMY W/ BILATERAL SALPINOOPHORECTOMY  11-27-2001   HOLMIUM LASER APPLICATION Left 08/04/2013   Procedure: HOLMIUM LASER APPLICATION;  Surgeon: Sebastian Ache, MD;  Location: [facility];  Service: Urology;  Laterality: Left;   LAPAROSCOPIC GASTRIC SLEEVE RESECTION N/A 03/27/2017   Procedure: LAPAROSCOPIC GASTRIC SLEEVE RESECTION WITH UPPER ENDO;  Surgeon: Ovidio Kin, MD;  Location: [facility];  Service: General;  Laterality: N/A;   LEFT URETEROSCOPIC LASER LITHO STONE EXTRACTION AND STENT PLACEMENT  03-25-2005   LUMBAR FUSION  X2   LAST ONE 2000   MASTOPEXY Bilateral 12/25/2022   Procedure: MASTOPEXY;  Surgeon: Glenna Fellows, MD;  Location: [facility];  Service: Plastics;  Laterality: Bilateral;   ORIF LEFT ANKLE FX  03-20-2000   RETAINED HARDWARE   RADIOACTIVE SEED GUIDED EXCISIONAL BREAST BIOPSY Left 02/27/2022   Procedure: RADIOACTIVE SEED GUIDED EXCISIONAL LEFT BREAST BIOPSY;  Surgeon: Emelia Loron, MD;  Location: [facility];  Service: General;  Laterality: Left;   VAGINAL HYSTERECTOMY  1999   WISDOM TOOTH EXTRACTION     Patient Active Problem List   Diagnosis Date Noted  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) 12/30/2021   Hyperlipidemia 08/19/2014   Hypertension 08/19/2014   Hypothyroidism 08/19/2014   Primary osteoarthritis of both knees 08/19/2014      REFERRING PROVIDER: Mancel Parsons, MD  REFERRING DIAG: Chronic musculoskeletal Pain  THERAPY DIAG:  Cervicalgia  Malignant neoplasm of upper-outer quadrant of right breast in female, estrogen receptor positive (HCC)  Chronic musculoskeletal pain  Stiffness of right shoulder, not elsewhere classified  Stiffness of left shoulder, not elsewhere classified  Abnormal  posture  Status post radiation therapy  ONSET DATE: January 2025  Rationale for Evaluation and Treatment: Rehabilitation  SUBJECTIVE:                                                                                                                                                                                           SUBJECTIVE STATEMENT:  It was wonderful after seeing Stacy last visit. I felt really good through the weekend. Mu fingers still have the aggravating tingle, but I have not woken up in the night with pain. I can sleep from 10 Pm to 5:30 or 6. I have been doing my exercises every day. My ROM is much better, and I am not in pain when doing it.   From evaluation: Pain is all back in my bilateral UT with pain down both arms and into hands right greater than left. My Right posterior shoulder is killing me. I have knots everywhere. I have to run my hands under the water because they are numb, and I get needles in the tips of my fingers. I can't hold a pencil.  I am doing all of my exercises, and use my traction every other day and I can feel the feeling start coming back in my right hand. I get relief for the rest of the afternoon until I go to bed. Laying flat makes everything worse. I bought a husband pillow to sleep at night so my arms are on the arms rest and my head and back are elevated. I let hot water beat on me and that helps. They put me on 20 mg prednisone on Feb 28. I don't see a lot of difference with it. The muscle relaxers help at night, but I don't think the gabapentin does much. She has had some ablations of the genicular nerves in her knee that helped for several weeks, but the pain is back again. My right knuckles hurt. I had bilateral breast lifts 12/24/2022. No problems after that with ROM   PERTINENT HISTORY:  Pt with pain involving her neck and right shoulder, right arm, and right hand greater  than left. Patient has a history significant for breast cancer with  bilateral Lumpectomies; Right for DCIS, left with a Complex Sclerosing Lesion (Benign) and 0/1 incidental nodes. She had Adjuvant radiation on the right ending on 05/08/2022. Neck pain started 1-3 months after radiation.  Pt had bilateral breast lifts on 12/24/2022 and she has been doing her exercises since then.Neck pain is sharp, stabbing, aching and constant. Noted to have Diffuse DDD, Trace anterolisthesis at C3-C4 and C4-C5, and 2-3 mm anterolisthesis at C7-T1. Trace retrolisthesis at C6-C7,. Multilevel degenerative disc changes with disc space height loss most pronounced and moderate to severe at C5-C6 and C6-C7,Multilevel uncovertebral spurring and facet arthropathy throughout cervical spine.   PAIN:  Are you having pain? Yes NPRS scale: 1/10  Pain location: Greatest at right neck, arm and hand Pain orientation: Right  PAIN TYPE: aching, burning, sharp, throbbing, and tingling Pain description: constant  Aggravating factors: sleeping, not sure Relieving factors: traction, warm water running on me  PRECAUTIONS: Prior foot ,ankle (rods and pins) Left LE swelling,, back surgery, gastric sleeve, hysterectomy, pain in bilateral knees   RED FLAGS: Bowel or bladder incontinence: Yes:      WEIGHT BEARING RESTRICTIONS: No  FALLS:  Has patient fallen in last 6 months? 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  COGNITION: Overall cognitive status: Within functional limits for tasks assessed   PALPATION: Tender to palpation bilateral UT,levator, pectorals, posterior cervicals right greater than left  OBSERVATIONS / OTHER ASSESSMENTS: Mild redness dorsum of right hand, pt postures with scapular  protraction, and UT elevation  SENSATION: Light touch: Deficits all fingers of right hand with decreased/altered sensation and fingertips of both hands    POSTURE: forward head, rounded shoulders, UT elevation  UPPER EXTREMITY AROM/PROM:  A/PROM RIGHT   eval  RIGHT 04/20/2023 Right  4/4  Shoulder extension 53, pain post shoulder     Shoulder flexion 132, pain top and under arm 160 160  Shoulder abduction 120, pain chest and under arm 168   Shoulder internal rotation 63    Shoulder external rotation 80, under arm, chest, shoulder      (Blank rows = not tested)  A/PROM LEFT   eval LEFT 04/20/2023 Left 4/4  Shoulder extension 55    Shoulder flexion 132 163 163  Shoulder abduction 132, pain under arm and top of arm 164   Shoulder internal rotation 64    Shoulder external rotation 85      (Blank rows = not tested)  CERVICAL AROM:     Percent limited 4/4  Flexion WNL, tight upper back/shoulders 55  Extension Dec 50%, increased pain neck, arms 46  Right lateral flexion Dec 20%, pain right neck/shoulder 48  Left lateral flexion Very tight/painful left UT 42  Right rotation Decreased 60%, pain right neck 55  Left rotation WFL, no pain 60    UPPER EXTREMITY STRENGTH: GRIP #2: Left 35,27              Right  8,5           Pincer grip Left  3,3               Right 2,1  LYMPHEDEMA ASSESSMENTS:   SURGERY TYPE/DATE:02/27/2022  Bilateral breast lumpectomies,  12/25/2022 Bilateral Mastopexy  NUMBER OF LYMPH NODES REMOVED: Left 0/1 incidental LN Right 0 LN's  CHEMOTHERAPY: NO  RADIATION:Adjuvant ended 05/08/2022   HORMONE TREATMENT: NO  INFECTIONS: NO    GRIP: left 35,27              right  8,5 Pincer grip  3,3                   Right 2,1   FUNCTIONAL TESTS:    GAIT: WNL    QUICK DASH SURVEY: 70% 4/4: 18.2%                                                                                                                            TREATMENT DATE:  05/07/2023 Ball  rolls on wall x 10 forward, abd x 5 on right Wall push ups x 10 Jobes flexion and scaption x 10  1# STM to bilateral posterior cervicals, UT, levator, and in SL to bilateral scapular, lats, levator areas with cocoa butter Suboccipital release, manual traction with prolonged hold Supine cervical retractions into therapists hands x 10  05/04/2023 Quick DASH Cervical ROM Shoulder ROM Review of shoulder band ex's Manual therapy: cervical and periscapular musculature Trigger Point Dry Needling Subsequent Treatment: Pt instructed on Dry Needling rational, procedures, and possible side effects. Pt instructed to expect mild to moderate muscle soreness later in the day and/or into the next day.  Pt instructed in methods to reduce muscle soreness. Pt instructed to continue prescribed HEP. Because Dry Needling was performed over or adjacent to a lung field, pt was educated on S/S of pneumothorax and to seek immediate medical attention should they occur.  Patient was educated on signs and symptoms of infection and other risk factors and advised to seek medical attention should they occur.  Patient verbalized understanding of these instructions and education.  Patient Verbal Consent Given: Yes Education Handout Provided: Yes Muscles Treated:  bil rhomboids, bil upper trap, bil levators; bil infraspinatus, bil teres Electrical Stimulation Performed: No Treatment Response/Outcome: twitch and improved tissue mobility   Elongation and release after dry needling  05/02/2023 Fingers in median nerve distribution get numb and tingly, and top of right hand.  When she does the traction her hands are temporarily better. STM to bilateral posterior cervicals, UT, levator, and in SL to bilateral scapular, lats, levator areas with cocoa butter Suboccipital release, manual traction with prolonged hold Supine cervical retractions into therapists hands x 10 Supine scapular series with yellow band scaption, horizontal  abd, ER, sword with thumb up  B all x 5   04/30/2023 Reviewed standing theraband with yellow ;scapular retraction, extension, bilateral ER all x 10 Supine horizontal abd x 10, diagonals x 5 ea supine STM to bilateral posterior cervicals, UT, levator, and in SL to bilateral scapular, lats, levator areas with cocoa butter Suboccipital release, manual traction with prolonged hold Supine cervical retractions into therapists hands x 10  04/27/2023 Standing Yellow band: rows 10x Standing yellow band: bil shoulder extension 10x Standing yellow band: external rotation 10x Manual therapy: cervical and periscapular musculature Trigger Point Dry Needling Subsequent Treatment: Pt instructed on Dry Needling rational, procedures, and possible side effects. Pt instructed to expect mild to moderate muscle soreness later in the day and/or into the next day.  Pt instructed in methods to reduce muscle soreness. Pt instructed to continue prescribed HEP. Because Dry Needling was performed over or adjacent to a lung field, pt was educated on S/S of pneumothorax and to seek immediate medical attention should they occur.  Patient was educated on signs and symptoms of infection and other risk factors and advised to seek medical attention should they occur.  Patient verbalized understanding of these instructions and education.   Patient Verbal Consent Given: Yes Education Handout Provided: Yes Muscles Treated:  bil rhomboids, bil upper trap, subscapularis and levators  Electrical Stimulation Performed: No Treatment Response/Outcome: twitch and improved tissue mobility   Elongation and release after dry needling   04/26/2023 Cervical rotations and SB x 5 ea Cervical retractions x 10 Scapular retractions x 10 Pectoral stretch in corner 4 x 20 sec STM to bilateral posterior cervicals, UT, levator, and in SL to bilateral scapular areas with cocoa butter Suboccipital release, manual traction with prolonged  hold Supine cervical retractions into therapists hands x 10  04/23/2023 Cervical rotations and SB x 5 ea Cervical retractions x 10 Scapular retractions x 10 Pectoral stretch in corner 3 x 20 sec STM to bilateral posterior cervicals, UT, levator, and in SL to bilateral scapular areas with cocoa butter Suboccipital release, manual traction with prolonged hold Supine cervical retractions into therapists hands x 10 Educated pt in supine to and from sit 04/20/2023 Reviewed wall slides x 5, stargazer x 5 and supine shoulder flexion for pt to continue doing at home Bilateral pectoral stretches in doorway x 3 ea Manual traction, sub occipital release STM to bilateral posterior cervicals, UT, levator, and in SL to bilateral scapular areas with cocoa butter Suboccipital release, manual traction with prolonged hold Supine cervical retractions into therapists hands x 10 Measured AROM bilateral shoulders 04/19/2023 Cervical ROM Bilateral rot x 5 , SB x 5, Cervical retraction 2 x 5 all without increased pain and with  increased ROM STM to bilateral posterior cervicals, UT, levator, with cocoa butter, and in SL to bilateral scapular areas with cocoa butter Suboccipital release, manual traction with prolonged hold     PATIENT EDUCATION:  Education details: see above 04/13/2023 Person educated: Patient Education method: Explanation Education comprehension: verbalized understanding and returned demonstration  HOME EXERCISE PROGRAM: Access Code: [email or web address] URL: [email or web address] Date: 04/17/2023 Prepared by: Tresa Endo  Exercises - Sternocleidomastoid Stretch  - 2 x daily - 7 x weekly - 1 sets - 3 reps - 30 hold - Gentle Levator Scapulae Stretch (Mirrored)  - 1 x daily - 7 x weekly - 1 sets - 3 reps - 30 hold - Seated Rhomboid Stretch  - 1 x daily - 7 x weekly - 1 sets - 3 reps - 30 hold - Radial Nerve Flossing (Mirrored)  - 1 x daily - 7 x weekly - 1 sets - 10 reps - Median Nerve  Flossing  - 1 x daily - 7 x weekly - 1 sets - 10 reps  ASSESSMENT:  CLINICAL IMPRESSION:  Pt doing much better overall with pain overall down to a 1/10 before rx and 0/10 after treatment. Multiple tender points and shortened areas  bilaterally in scapular regions left greater than right today.  OBJECTIVE IMPAIRMENTS: decreased activity tolerance, decreased knowledge of condition, decreased ROM, decreased strength, impaired sensation, impaired UE functional use, postural dysfunction, and pain.   ACTIVITY LIMITATIONS: carrying, lifting, sleeping, bed mobility, dressing, and reach over head  PARTICIPATION LIMITATIONS: cleaning, laundry, driving, shopping, and community activity  PERSONAL FACTORS: 3+ comorbidities: Right breast cancer s/p radiation, chronic pain  are also affecting patient's functional outcome.   REHAB POTENTIAL: Good  CLINICAL DECISION MAKING: Evolving/moderate complexity  EVALUATION COMPLEXITY: Moderate  GOALS: Goals reviewed with patient? Yes  SHORT TERM GOALS: Target date: 05/04/2023  Pt will be independent with HEP for ROM and strengthening of neck and shoulder Baseline: Goal status: MET 04/23/2023 2.  Pt will report overall pain improved by 30% Baseline:  Goal status: MET 04/23/2023 3.  Bilateral shoulder flexion and abduction will be improved to atleast 140 degrees for improved reaching Baseline:  Goal status: MET 04/20/2023 4.  Pt will have quick dash improved by 20 points or more to demonstrate improved function Baseline: 70 Goal status: INITIAL  5.  Right grip strength will improve to 15  lbs for improved ability to perform home activities Baseline:  Goal status: INITIAL   LONG TERM GOALS: Target date: 05/25/2023  Pt will have bilateral shoulder flexion and abd 150 degrees for improved reaching Baseline:  Goal status: Met 04/23/2023 2.  Pt will report overall pain 60% improved or better Baseline:  Goal status: INITIAL  3.  Pt will have right grip  strength increased to 20 lbs or more for improved ability to open jars. Baseline:  Goal status: INITIAL  4.  Pt will have quick dash no greater than 20% to demonstrate improved function Baseline:  Goal status: met 4/4  5.  Pt will be able to sleep more comfortably without increased neck pain Baseline:  Goal status: INITIAL   PLAN:  PT FREQUENCY: 3x/week  PT DURATION: 6 weeks   PLANNED INTERVENTIONS: 97164- PT Re-evaluation, 97110-Therapeutic exercises, 97530- Therapeutic activity, 97112- Neuromuscular re-education, 97535- Self Care, 16109- Manual therapy, 97760- Orthotic Fit/training, and Dry Needling  PLAN FOR NEXT SESSION: STM to UT, levator, post. cervicals, scap region,manual traction, stargazer, wall slides, progress ROM as tolerated then strength, postural education,review home traction prn, DN as prior 1x/week  Lavinia Sharps, PT 05/07/23 12:01 PM Phone: [phone] Fax: [phone]

## 2023-05-09 ENCOUNTER — Ambulatory Visit: Admitting: Physical Therapy

## 2023-05-09 DIAGNOSIS — G8929 Other chronic pain: Secondary | ICD-10-CM

## 2023-05-09 DIAGNOSIS — Z170 Estrogen receptor positive status [ER+]: Secondary | ICD-10-CM

## 2023-05-09 DIAGNOSIS — C50411 Malignant neoplasm of upper-outer quadrant of right female breast: Secondary | ICD-10-CM | POA: Diagnosis not present

## 2023-05-09 DIAGNOSIS — M542 Cervicalgia: Secondary | ICD-10-CM

## 2023-05-09 NOTE — Therapy (Signed)
 OUTPATIENT PHYSICAL THERAPY  TREATMENT  Patient Name: Christie Fox MRN: [id number] DOB:07-12-1955, 68 y.o., female Today's Date: 05/09/2023      END OF SESSION:  PT End of Session - 05/09/23 0753     Visit Number 12    Number of Visits 18    Date for PT Re-Evaluation 05/25/23    Authorization Type [employer] 18 visits 3/14-4/25    Authorization Time Period 04/13/2023-05/25/2023    Authorization - Visit Number 12    Authorization - Number of Visits 18    Progress Note Due on Visit 20    PT Start Time 0757    PT Stop Time 0843    PT Time Calculation (min) 46 min    Activity Tolerance Patient tolerated treatment well              Past Medical History:  Diagnosis Date   Anxiety    Arthritis    knees   At risk for sleep apnea    STOP-BANG= 4    SENT TO PCP 07-31-2013   Borderline diabetes    diet controlled   Breast cancer (HCC)    Dysuria    History of kidney stones    Hyperlipidemia    Hypertension    Hypothyroidism    Migraines    PONV (postoperative nausea and vomiting)    "please call me [name] to wake me up , not Mrs Marcella"    Renal calculus, left    Urgency of urination    Past Surgical History:  Procedure Laterality Date   BREAST LUMPECTOMY WITH RADIOACTIVE SEED LOCALIZATION Right 02/27/2022   Procedure: RIGHT BREAST LUMPECTOMY WITH RADIOACTIVE SEED LOCALIZATION X3;  Surgeon: Emelia Loron, MD;  Location: [facility];  Service: General;  Laterality: Right;   CESAREAN SECTION  x2   CYSTOSCOPY WITH RETROGRADE PYELOGRAM, URETEROSCOPY AND STENT PLACEMENT Left 08/04/2013   Procedure: 1ST STAGE CYSTOSCOPY WITH RETROGRADE PYELOGRAM, URETEROSCOPY AND STENT PLACEMENT;  Surgeon: Sebastian Ache, MD;  Location: [facility];  Service: Urology;  Laterality: Left;   CYSTOSCOPY WITH RETROGRADE PYELOGRAM, URETEROSCOPY AND STENT PLACEMENT Left 08/20/2013   Procedure: 2ND STAGE CYSTOSCOPY WITH RETROGRADE PYELOGRAM, URETEROSCOPY BASKET STONES AND STENT EXCHANGE,  BLOOD MOP;  Surgeon: Sebastian Ache, MD;  Location: [facility];  Service: Urology;  Laterality: Left;   EXPLORATORY LAPAROTOMY W/ BILATERAL SALPINOOPHORECTOMY  11-27-2001   HOLMIUM LASER APPLICATION Left 08/04/2013   Procedure: HOLMIUM LASER APPLICATION;  Surgeon: Sebastian Ache, MD;  Location: [facility];  Service: Urology;  Laterality: Left;   LAPAROSCOPIC GASTRIC SLEEVE RESECTION N/A 03/27/2017   Procedure: LAPAROSCOPIC GASTRIC SLEEVE RESECTION WITH UPPER ENDO;  Surgeon: Ovidio Kin, MD;  Location: [facility];  Service: General;  Laterality: N/A;   LEFT URETEROSCOPIC LASER LITHO STONE EXTRACTION AND STENT PLACEMENT  03-25-2005   LUMBAR FUSION  X2   LAST ONE 2000   MASTOPEXY Bilateral 12/25/2022   Procedure: MASTOPEXY;  Surgeon: Glenna Fellows, MD;  Location: [facility];  Service: Plastics;  Laterality: Bilateral;   ORIF LEFT ANKLE FX  03-20-2000   RETAINED HARDWARE   RADIOACTIVE SEED GUIDED EXCISIONAL BREAST BIOPSY Left 02/27/2022   Procedure: RADIOACTIVE SEED GUIDED EXCISIONAL LEFT BREAST BIOPSY;  Surgeon: Emelia Loron, MD;  Location: [facility];  Service: General;  Laterality: Left;   VAGINAL HYSTERECTOMY  1999   WISDOM TOOTH EXTRACTION     Patient Active Problem List   Diagnosis Date Noted   Malignant neoplasm of upper outer quadrant  of female breast (HCC) 12/30/2021   Hyperlipidemia 08/19/2014   Hypertension 08/19/2014   Hypothyroidism 08/19/2014   Primary osteoarthritis of both knees 08/19/2014      REFERRING PROVIDER: Mancel Parsons, MD  REFERRING DIAG: Chronic musculoskeletal Pain  THERAPY DIAG:  Cervicalgia  Malignant neoplasm of upper-outer quadrant of right breast in female, estrogen receptor positive (HCC)  Chronic musculoskeletal pain  ONSET DATE: January 2025  Rationale for Evaluation and Treatment: Rehabilitation  SUBJECTIVE:                                                                                                                                                                                            SUBJECTIVE STATEMENT:  DN went really well.  I feel it mostly upper traps and between the shoulder blades.      From evaluation: Pain is all back in my bilateral UT with pain down both arms and into hands right greater than left. My Right posterior shoulder is killing me. I have knots everywhere. I have to run my hands under the water because they are numb, and I get needles in the tips of my fingers. I can't hold a pencil.  I am doing all of my exercises, and use my traction every other day and I can feel the feeling start coming back in my right hand. I get relief for the rest of the afternoon until I go to bed. Laying flat makes everything worse. I bought a husband pillow to sleep at night so my arms are on the arms rest and my head and back are elevated. I let hot water beat on me and that helps. They put me on 20 mg prednisone on Feb 28. I don't see a lot of difference with it. The muscle relaxers help at night, but I don't think the gabapentin does much. She has had some ablations of the genicular nerves in her knee that helped for several weeks, but the pain is back again. My right knuckles hurt. I had bilateral breast lifts 12/24/2022. No problems after that with ROM   PERTINENT HISTORY:  Pt with pain involving her neck and right shoulder, right arm, and right hand greater than left. Patient has a history significant for breast cancer with bilateral Lumpectomies; Right for DCIS, left with a Complex Sclerosing Lesion (Benign) and 0/1 incidental nodes. She had Adjuvant radiation on the right ending on 05/08/2022. Neck pain started 1-3 months after radiation.  Pt had bilateral breast lifts on 12/24/2022 and she has been doing her exercises since then.Neck pain is sharp, stabbing, aching and constant. Noted to have Diffuse DDD, Trace anterolisthesis at  C3-C4 and C4-C5, and 2-3 mm anterolisthesis at C7-T1.  Trace retrolisthesis at C6-C7,. Multilevel degenerative disc changes with disc space height loss most pronounced and moderate to severe at C5-C6 and C6-C7,Multilevel uncovertebral spurring and facet arthropathy throughout cervical spine.   PAIN:  Are you having pain? Yes NPRS scale: 1/10  Pain location: bil upper traps and between the shoulder blades Pain orientation: Right  PAIN TYPE: aching, burning, sharp, throbbing, and tingling Pain description: constant  Aggravating factors: sleeping, not sure Relieving factors: traction, warm water running on me  PRECAUTIONS: Prior foot ,ankle (rods and pins) Left LE swelling,, back surgery, gastric sleeve, hysterectomy, pain in bilateral knees   RED FLAGS: Bowel or bladder incontinence: Yes:      WEIGHT BEARING RESTRICTIONS: No  FALLS:  Has patient fallen in last 6 months? 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  COGNITION: Overall cognitive status: Within functional limits for tasks assessed   PALPATION: Tender to palpation bilateral UT,levator, pectorals, posterior cervicals right greater than left  OBSERVATIONS / OTHER ASSESSMENTS: Mild redness dorsum of right hand, pt postures with scapular protraction, and UT elevation  SENSATION: Light touch: Deficits all fingers of right hand with decreased/altered sensation and fingertips of both hands    POSTURE: forward head, rounded shoulders, UT elevation  UPPER EXTREMITY AROM/PROM:  A/PROM RIGHT   eval  RIGHT 04/20/2023 Right  4/4  Shoulder extension 53, pain post shoulder     Shoulder flexion 132, pain top and under arm 160 160  Shoulder abduction 120, pain chest and under arm  168   Shoulder internal rotation 63    Shoulder external rotation 80, under arm, chest, shoulder      (Blank rows = not tested)  A/PROM LEFT   eval LEFT 04/20/2023 Left 4/4  Shoulder extension 55    Shoulder flexion 132 163 163  Shoulder abduction 132, pain under arm and top of arm 164   Shoulder internal rotation 64    Shoulder external rotation 85      (Blank rows = not tested)  CERVICAL AROM:     Percent limited 4/4  Flexion WNL, tight upper back/shoulders 55  Extension Dec 50%, increased pain neck, arms 46  Right lateral flexion Dec 20%, pain right neck/shoulder 48  Left lateral flexion Very tight/painful left UT 42  Right rotation Decreased 60%, pain right neck 55  Left rotation WFL, no pain 60    UPPER EXTREMITY STRENGTH: GRIP #2: Left 35,27              Right  8,5           Pincer grip Left  3,3               Right 2,1  LYMPHEDEMA ASSESSMENTS:   SURGERY TYPE/DATE:02/27/2022 Bilateral breast lumpectomies,  12/25/2022 Bilateral Mastopexy  NUMBER OF LYMPH NODES REMOVED: Left 0/1 incidental LN Right 0 LN's  CHEMOTHERAPY: NO  RADIATION:Adjuvant ended 05/08/2022   HORMONE TREATMENT: NO  INFECTIONS: NO    GRIP: left 35,27              right  8,5 Pincer grip  3,3  Right 2,1   FUNCTIONAL TESTS:    GAIT: WNL    QUICK DASH SURVEY: 70% 4/4: 18.2%                                                                                                                            TREATMENT DATE:  05/09/2023 Review of status Discussion on benefit of not only stretching but band ex's to generate blood flow/reduce ischemia to soft tissue Tennis ball "pinning" trigger point against wall with UE elevation and horizontal abduction/adduction Manual therapy: cervical and periscapular musculature Trigger Point Dry Needling Subsequent Treatment: Pt instructed on Dry Needling rational, procedures, and possible side effects. Pt instructed to expect mild to moderate  muscle soreness later in the day and/or into the next day.  Pt instructed in methods to reduce muscle soreness. Pt instructed to continue prescribed HEP. Because Dry Needling was performed over or adjacent to a lung field, pt was educated on S/S of pneumothorax and to seek immediate medical attention should they occur.  Patient was educated on signs and symptoms of infection and other risk factors and advised to seek medical attention should they occur.  Patient verbalized understanding of these instructions and education.  Patient Verbal Consent Given: Yes Education Handout Provided: Yes Muscles Treated:  bil rhomboids, bil upper trap, bil levators; bil infraspinatus, bil teres, bil subscapularis Electrical Stimulation Performed: No Treatment Response/Outcome: twitch and improved tissue mobility   Elongation and release after dry needling  05/07/2023 Ball rolls on wall x 10 forward, abd x 5 on right Wall push ups x 10 Jobes flexion and scaption x 10  1# STM to bilateral posterior cervicals, UT, levator, and in SL to bilateral scapular, lats, levator areas with cocoa butter Suboccipital release, manual traction with prolonged hold Supine cervical retractions into therapists hands x 10  05/04/2023 Quick DASH Cervical ROM Shoulder ROM Review of shoulder band ex's Manual therapy: cervical and periscapular musculature Trigger Point Dry Needling Subsequent Treatment: Pt instructed on Dry Needling rational, procedures, and possible side effects. Pt instructed to expect mild to moderate muscle soreness later in the day and/or into the next day.  Pt instructed in methods to reduce muscle soreness. Pt instructed to continue prescribed HEP. Because Dry Needling was performed over or adjacent to a lung field, pt was educated on S/S of pneumothorax and to seek immediate medical attention should they occur.  Patient was educated on signs and symptoms of infection and other risk factors and advised to  seek medical attention should they occur.  Patient verbalized understanding of these instructions and education.  Patient Verbal Consent Given: Yes Education Handout Provided: Yes Muscles Treated:  bil rhomboids, bil upper trap, bil levators; bil infraspinatus, bil teres Electrical Stimulation Performed: No Treatment Response/Outcome: twitch and improved tissue mobility   Elongation and release after dry needling  05/02/2023 Fingers in median nerve distribution get numb and tingly, and top of right hand.  When she does the traction her  hands are temporarily better. STM to bilateral posterior cervicals, UT, levator, and in SL to bilateral scapular, lats, levator areas with cocoa butter Suboccipital release, manual traction with prolonged hold Supine cervical retractions into therapists hands x 10 Supine scapular series with yellow band scaption, horizontal abd, ER, sword with thumb up  B all x 5   04/30/2023 Reviewed standing theraband with yellow ;scapular retraction, extension, bilateral ER all x 10 Supine horizontal abd x 10, diagonals x 5 ea supine STM to bilateral posterior cervicals, UT, levator, and in SL to bilateral scapular, lats, levator areas with cocoa butter Suboccipital release, manual traction with prolonged hold Supine cervical retractions into therapists hands x 10  04/27/2023 Standing Yellow band: rows 10x Standing yellow band: bil shoulder extension 10x Standing yellow band: external rotation 10x Manual therapy: cervical and periscapular musculature Trigger Point Dry Needling Subsequent Treatment: Pt instructed on Dry Needling rational, procedures, and possible side effects. Pt instructed to expect mild to moderate muscle soreness later in the day and/or into the next day.  Pt instructed in methods to reduce muscle soreness. Pt instructed to continue prescribed HEP. Because Dry Needling was performed over or adjacent to a lung field, pt was educated on S/S of  pneumothorax and to seek immediate medical attention should they occur.  Patient was educated on signs and symptoms of infection and other risk factors and advised to seek medical attention should they occur.  Patient verbalized understanding of these instructions and education.   Patient Verbal Consent Given: Yes Education Handout Provided: Yes Muscles Treated:  bil rhomboids, bil upper trap, subscapularis and levators  Electrical Stimulation Performed: No Treatment Response/Outcome: twitch and improved tissue mobility   Elongation and release after dry needling   04/26/2023 Cervical rotations and SB x 5 ea Cervical retractions x 10 Scapular retractions x 10 Pectoral stretch in corner 4 x 20 sec STM to bilateral posterior cervicals, UT, levator, and in SL to bilateral scapular areas with cocoa butter Suboccipital release, manual traction with prolonged hold Supine cervical retractions into therapists hands x 10  04/23/2023 Cervical rotations and SB x 5 ea Cervical retractions x 10 Scapular retractions x 10 Pectoral stretch in corner 3 x 20 sec STM to bilateral posterior cervicals, UT, levator, and in SL to bilateral scapular areas with cocoa butter Suboccipital release, manual traction with prolonged hold Supine cervical retractions into therapists hands x 10 Educated pt in supine to and from sit     PATIENT EDUCATION:  Education details: see above 04/13/2023 Person educated: Patient Education method: Explanation Education comprehension: verbalized understanding and returned demonstration  HOME EXERCISE PROGRAM: Access Code: [email or web address] URL: [email or web address] Date: 04/17/2023 Prepared by: Tresa Endo  Exercises - Sternocleidomastoid Stretch  - 2 x daily - 7 x weekly - 1 sets - 3 reps - 30 hold - Gentle Levator Scapulae Stretch (Mirrored)  - 1 x daily - 7 x weekly - 1 sets - 3 reps - 30 hold - Seated Rhomboid Stretch  - 1 x daily - 7 x weekly - 1 sets - 3 reps -  30 hold - Radial Nerve Flossing (Mirrored)  - 1 x daily - 7 x weekly - 1 sets - 10 reps - Median Nerve Flossing  - 1 x daily - 7 x weekly - 1 sets - 10 reps  ASSESSMENT:  CLINICAL IMPRESSION: Ryland responds very well to DN to address numerous tender points.  The patient had numerous muscle twitches produced during dry needling which is a good prognostic indicator  for benefit as it is associated with positive neuromotor and nervous system changes. The patient was encouraged in regular performance of HEP post DN including soft tissue lengthening and strengthening exercises to enhance long term benefit.      OBJECTIVE IMPAIRMENTS: decreased activity tolerance, decreased knowledge of condition, decreased ROM, decreased strength, impaired sensation, impaired UE functional use, postural dysfunction, and pain.   ACTIVITY LIMITATIONS: carrying, lifting, sleeping, bed mobility, dressing, and reach over head  PARTICIPATION LIMITATIONS: cleaning, laundry, driving, shopping, and community activity  PERSONAL FACTORS: 3+ comorbidities: Right breast cancer s/p radiation, chronic pain  are also affecting patient's functional outcome.   REHAB POTENTIAL: Good  CLINICAL DECISION MAKING: Evolving/moderate complexity  EVALUATION COMPLEXITY: Moderate  GOALS: Goals reviewed with patient? Yes  SHORT TERM GOALS: Target date: 05/04/2023  Pt will be independent with HEP for ROM and strengthening of neck and shoulder Baseline: Goal status: MET 04/23/2023 2.  Pt will report overall pain improved by 30% Baseline:  Goal status: MET 04/23/2023 3.  Bilateral shoulder flexion and abduction will be improved to atleast 140 degrees for improved reaching Baseline:  Goal status: MET 04/20/2023 4.  Pt will have quick dash improved by 20 points or more to demonstrate improved function Baseline: 70 Goal status: INITIAL  5.  Right grip strength will improve to 15  lbs for improved ability to perform home  activities Baseline:  Goal status: INITIAL   LONG TERM GOALS: Target date: 05/25/2023  Pt will have bilateral shoulder flexion and abd 150 degrees for improved reaching Baseline:  Goal status: Met 04/23/2023 2.  Pt will report overall pain 60% improved or better Baseline:  Goal status: INITIAL  3.  Pt will have right grip strength increased to 20 lbs or more for improved ability to open jars. Baseline:  Goal status: INITIAL  4.  Pt will have quick dash no greater than 20% to demonstrate improved function Baseline:  Goal status: met 4/4  5.  Pt will be able to sleep more comfortably without increased neck pain Baseline:  Goal status: INITIAL   PLAN:  PT FREQUENCY: 3x/week  PT DURATION: 6 weeks   PLANNED INTERVENTIONS: 97164- PT Re-evaluation, 97110-Therapeutic exercises, 97530- Therapeutic activity, 97112- Neuromuscular re-education, 97535- Self Care, 16109- Manual therapy, 97760- Orthotic Fit/training, and Dry Needling  PLAN FOR NEXT SESSION: STM to UT, levator, post. cervicals, scap region,manual traction, stargazer, wall slides, progress ROM as tolerated then strength, postural education,review home traction prn, DN 1x/week  Lavinia Sharps, PT 05/09/23 5:39 PM Phone: [phone] Fax: [phone]

## 2023-05-10 ENCOUNTER — Ambulatory Visit

## 2023-05-10 DIAGNOSIS — M25612 Stiffness of left shoulder, not elsewhere classified: Secondary | ICD-10-CM

## 2023-05-10 DIAGNOSIS — Z170 Estrogen receptor positive status [ER+]: Secondary | ICD-10-CM

## 2023-05-10 DIAGNOSIS — C50411 Malignant neoplasm of upper-outer quadrant of right female breast: Secondary | ICD-10-CM | POA: Diagnosis not present

## 2023-05-10 DIAGNOSIS — M542 Cervicalgia: Secondary | ICD-10-CM

## 2023-05-10 DIAGNOSIS — G8929 Other chronic pain: Secondary | ICD-10-CM

## 2023-05-10 DIAGNOSIS — M25611 Stiffness of right shoulder, not elsewhere classified: Secondary | ICD-10-CM

## 2023-05-10 DIAGNOSIS — R293 Abnormal posture: Secondary | ICD-10-CM

## 2023-05-10 NOTE — Therapy (Signed)
 OUTPATIENT PHYSICAL THERAPY  TREATMENT  Patient Name: Christie Fox MRN: [id number] DOB:10/20/1955, 68 y.o., female Today's Date: 05/10/2023      END OF SESSION:  PT End of Session - 05/10/23 0753     Visit Number 13    Number of Visits 18    Date for PT Re-Evaluation 05/25/23    Authorization Type [employer] 18 visits 3/14-4/25    Authorization Time Period 04/13/2023-05/25/2023    Authorization - Visit Number 12    Authorization - Number of Visits 18    Progress Note Due on Visit 20    PT Start Time 0800    PT Stop Time 0855    PT Time Calculation (min) 55 min    Activity Tolerance Patient tolerated treatment well    Behavior During Therapy WFL for tasks assessed/performed              Past Medical History:  Diagnosis Date   Anxiety    Arthritis    knees   At risk for sleep apnea    STOP-BANG= 4    SENT TO PCP 07-31-2013   Borderline diabetes    diet controlled   Breast cancer (HCC)    Dysuria    History of kidney stones    Hyperlipidemia    Hypertension    Hypothyroidism    Migraines    PONV (postoperative nausea and vomiting)    "please call me Marlyn to wake me up , not Mrs Balash"    Renal calculus, left    Urgency of urination    Past Surgical History:  Procedure Laterality Date   BREAST LUMPECTOMY WITH RADIOACTIVE SEED LOCALIZATION Right 02/27/2022   Procedure: RIGHT BREAST LUMPECTOMY WITH RADIOACTIVE SEED LOCALIZATION X3;  Surgeon: Emelia Loron, MD;  Location: [facility];  Service: General;  Laterality: Right;   CESAREAN SECTION  x2   CYSTOSCOPY WITH RETROGRADE PYELOGRAM, URETEROSCOPY AND STENT PLACEMENT Left 08/04/2013   Procedure: 1ST STAGE CYSTOSCOPY WITH RETROGRADE PYELOGRAM, URETEROSCOPY AND STENT PLACEMENT;  Surgeon: Sebastian Ache, MD;  Location: [facility];  Service: Urology;  Laterality: Left;   CYSTOSCOPY WITH RETROGRADE PYELOGRAM, URETEROSCOPY AND STENT PLACEMENT Left 08/20/2013   Procedure: 2ND STAGE CYSTOSCOPY WITH  RETROGRADE PYELOGRAM, URETEROSCOPY BASKET STONES AND STENT EXCHANGE, BLOOD MOP;  Surgeon: Sebastian Ache, MD;  Location: [facility];  Service: Urology;  Laterality: Left;   EXPLORATORY LAPAROTOMY W/ BILATERAL SALPINOOPHORECTOMY  11-27-2001   HOLMIUM LASER APPLICATION Left 08/04/2013   Procedure: HOLMIUM LASER APPLICATION;  Surgeon: Sebastian Ache, MD;  Location: [facility];  Service: Urology;  Laterality: Left;   LAPAROSCOPIC GASTRIC SLEEVE RESECTION N/A 03/27/2017   Procedure: LAPAROSCOPIC GASTRIC SLEEVE RESECTION WITH UPPER ENDO;  Surgeon: Ovidio Kin, MD;  Location: [facility];  Service: General;  Laterality: N/A;   LEFT URETEROSCOPIC LASER LITHO STONE EXTRACTION AND STENT PLACEMENT  03-25-2005   LUMBAR FUSION  X2   LAST ONE 2000   MASTOPEXY Bilateral 12/25/2022   Procedure: MASTOPEXY;  Surgeon: Glenna Fellows, MD;  Location: [facility];  Service: Plastics;  Laterality: Bilateral;   ORIF LEFT ANKLE FX  03-20-2000   RETAINED HARDWARE   RADIOACTIVE SEED GUIDED EXCISIONAL BREAST BIOPSY Left 02/27/2022   Procedure: RADIOACTIVE SEED GUIDED EXCISIONAL LEFT BREAST BIOPSY;  Surgeon: Emelia Loron, MD;  Location: [facility];  Service: General;  Laterality: Left;   VAGINAL HYSTERECTOMY  1999   WISDOM TOOTH EXTRACTION     Patient Active Problem List   Diagnosis  Date Noted   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) 12/30/2021   Hyperlipidemia 08/19/2014   Hypertension 08/19/2014   Hypothyroidism 08/19/2014   Primary osteoarthritis of both knees 08/19/2014      REFERRING PROVIDER: Mancel Parsons, MD  REFERRING DIAG: Chronic musculoskeletal Pain  THERAPY DIAG:  Cervicalgia  Malignant neoplasm of upper-outer quadrant of right breast in female, estrogen receptor positive (HCC)  Chronic musculoskeletal pain  Stiffness of right shoulder, not elsewhere classified  Stiffness of left shoulder, not elsewhere  classified  Abnormal posture  ONSET DATE: January 2025  Rationale for Evaluation and Treatment: Rehabilitation  SUBJECTIVE:                                                                                                                                                                                           SUBJECTIVE STATEMENT:  I wasn't sore after DN, just a little soreness in my right UT. I didn't sleep well last night because I was hot. My hands bothered me last night also.     From evaluation: Pain is all back in my bilateral UT with pain down both arms and into hands right greater than left. My Right posterior shoulder is killing me. I have knots everywhere. I have to run my hands under the water because they are numb, and I get needles in the tips of my fingers. I can't hold a pencil.  I am doing all of my exercises, and use my traction every other day and I can feel the feeling start coming back in my right hand. I get relief for the rest of the afternoon until I go to bed. Laying flat makes everything worse. I bought a husband pillow to sleep at night so my arms are on the arms rest and my head and back are elevated. I let hot water beat on me and that helps. They put me on 20 mg prednisone on Feb 28. I don't see a lot of difference with it. The muscle relaxers help at night, but I don't think the gabapentin does much. She has had some ablations of the genicular nerves in her knee that helped for several weeks, but the pain is back again. My right knuckles hurt. I had bilateral breast lifts 12/24/2022. No problems after that with ROM   PERTINENT HISTORY:  Pt with pain involving her neck and right shoulder, right arm, and right hand greater than left. Patient has a history significant for breast cancer with bilateral Lumpectomies; Right for DCIS, left with a Complex Sclerosing Lesion (Benign) and 0/1 incidental nodes. She had Adjuvant radiation on the right  ending on 05/08/2022. Neck pain  started 1-3 months after radiation.  Pt had bilateral breast lifts on 12/24/2022 and she has been doing her exercises since then.Neck pain is sharp, stabbing, aching and constant. Noted to have Diffuse DDD, Trace anterolisthesis at C3-C4 and C4-C5, and 2-3 mm anterolisthesis at C7-T1. Trace retrolisthesis at C6-C7,. Multilevel degenerative disc changes with disc space height loss most pronounced and moderate to severe at C5-C6 and C6-C7,Multilevel uncovertebral spurring and facet arthropathy throughout cervical spine.   PAIN:  Are you having pain? Yes NPRS scale: 2/10  Pain location: bil upper traps and between the shoulder blades Pain orientation: Right  PAIN TYPE: aching, burning, sharp, throbbing, and tingling Pain description: constant  Aggravating factors: sleeping, not sure Relieving factors: traction, warm water running on me  PRECAUTIONS: Prior foot ,ankle (rods and pins) Left LE swelling,, back surgery, gastric sleeve, hysterectomy, pain in bilateral knees   RED FLAGS: Bowel or bladder incontinence: Yes:      WEIGHT BEARING RESTRICTIONS: No  FALLS:  Has patient fallen in last 6 months? 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  COGNITION: Overall cognitive status: Within functional limits for tasks assessed   PALPATION: Tender to palpation bilateral UT,levator, pectorals, posterior cervicals right greater than left  OBSERVATIONS / OTHER ASSESSMENTS: Mild redness dorsum of right hand, pt postures with scapular protraction, and UT elevation  SENSATION: Light touch: Deficits all fingers of right hand with decreased/altered sensation and fingertips of both hands    POSTURE:  forward head, rounded shoulders, UT elevation  UPPER EXTREMITY AROM/PROM:  A/PROM RIGHT   eval  RIGHT 04/20/2023 Right  4/4  Shoulder extension 53, pain post shoulder     Shoulder flexion 132, pain top and under arm 160 160  Shoulder abduction 120, pain chest and under arm 168   Shoulder internal rotation 63    Shoulder external rotation 80, under arm, chest, shoulder      (Blank rows = not tested)  A/PROM LEFT   eval LEFT 04/20/2023 Left 4/4  Shoulder extension 55    Shoulder flexion 132 163 163  Shoulder abduction 132, pain under arm and top of arm 164   Shoulder internal rotation 64    Shoulder external rotation 85      (Blank rows = not tested)  CERVICAL AROM:     Percent limited 4/4  Flexion WNL, tight upper back/shoulders 55  Extension Dec 50%, increased pain neck, arms 46  Right lateral flexion Dec 20%, pain right neck/shoulder 48  Left lateral flexion Very tight/painful left UT 42  Right rotation Decreased 60%, pain right neck 55  Left rotation WFL, no pain 60    UPPER EXTREMITY STRENGTH: GRIP #2: Left 35,27              Right  8,5           Pincer grip Left  3,3               Right 2,1  LYMPHEDEMA ASSESSMENTS:   SURGERY TYPE/DATE:02/27/2022 Bilateral breast lumpectomies,  12/25/2022 Bilateral Mastopexy  NUMBER OF LYMPH NODES REMOVED: Left 0/1 incidental LN Right 0 LN's  CHEMOTHERAPY: NO  RADIATION:Adjuvant ended 05/08/2022   HORMONE TREATMENT: NO  INFECTIONS: NO    GRIP: left 35,27              right  8,5 Pincer grip  3,3                   Right 2,1   FUNCTIONAL TESTS:    GAIT: WNL    QUICK DASH SURVEY: 70% 4/4: 18.2%                                                                                                                            TREATMENT DATE:   05/10/2023 Ball rolls on wall x 10 forward, 5 abd B Jobes flexion and scaption 1# x 10 Standing horizontal abd with yellow band x 10 Supine bilateral scaption x 10, horizontal abd  x 5 with 5 sec. Hold, Snow angels x 5 10 sec hold STM to bilateral posterior cervicals, UT, levator, and in SL to bilateral scapular, lats, levator areas with cocoa butter Suboccipital release, manual traction with prolonged hold   05/09/2023 Review of status Discussion on benefit of not only stretching but band ex's to generate blood flow/reduce ischemia to soft tissue Tennis ball "pinning" trigger point against wall with UE elevation and horizontal abduction/adduction Manual therapy: cervical and periscapular musculature Trigger Point Dry Needling Subsequent Treatment: Pt instructed on Dry Needling rational, procedures, and possible side effects. Pt instructed to expect mild to moderate muscle soreness later in the day and/or into the next day.  Pt instructed in methods to reduce muscle soreness. Pt instructed to continue prescribed HEP. Because Dry Needling was performed over or adjacent to a lung field, pt was educated on S/S of pneumothorax and to seek immediate medical attention should they occur.  Patient was educated on signs and symptoms of infection and other risk factors and advised to seek medical attention should they occur.  Patient verbalized understanding of these instructions and education.  Patient Verbal Consent Given: Yes Education Handout Provided: Yes Muscles Treated:  bil rhomboids, bil upper trap, bil levators; bil infraspinatus, bil teres, bil subscapularis Electrical Stimulation Performed: No Treatment Response/Outcome: twitch and improved tissue mobility   Elongation and release after dry needling  05/07/2023 Ball rolls on wall x 10 forward, abd x 5 on right Wall push ups x 10 Jobes flexion and scaption x 10  1# STM to bilateral posterior cervicals, UT, levator, and in SL to bilateral scapular, lats, levator areas with cocoa butter Suboccipital release, manual traction with prolonged hold Supine cervical retractions into therapists hands x 10  05/04/2023 Quick  DASH Cervical ROM Shoulder ROM Review of shoulder band ex's Manual therapy: cervical and periscapular musculature Trigger Point Dry Needling Subsequent Treatment: Pt instructed on Dry Needling rational, procedures, and possible side effects. Pt instructed to expect mild to moderate muscle soreness later in the day and/or into the next day.  Pt instructed in methods to reduce muscle soreness. Pt instructed to continue prescribed HEP. Because Dry Needling was performed  over or adjacent to a lung field, pt was educated on S/S of pneumothorax and to seek immediate medical attention should they occur.  Patient was educated on signs and symptoms of infection and other risk factors and advised to seek medical attention should they occur.  Patient verbalized understanding of these instructions and education.  Patient Verbal Consent Given: Yes Education Handout Provided: Yes Muscles Treated:  bil rhomboids, bil upper trap, bil levators; bil infraspinatus, bil teres Electrical Stimulation Performed: No Treatment Response/Outcome: twitch and improved tissue mobility   Elongation and release after dry needling  05/02/2023 Fingers in median nerve distribution get numb and tingly, and top of right hand.  When she does the traction her hands are temporarily better. STM to bilateral posterior cervicals, UT, levator, and in SL to bilateral scapular, lats, levator areas with cocoa butter Suboccipital release, manual traction with prolonged hold Supine cervical retractions into therapists hands x 10 Supine scapular series with yellow band scaption, horizontal abd, ER, sword with thumb up  B all x 5   04/30/2023 Reviewed standing theraband with yellow ;scapular retraction, extension, bilateral ER all x 10 Supine horizontal abd x 10, diagonals x 5 ea supine STM to bilateral posterior cervicals, UT, levator, and in SL to bilateral scapular, lats, levator areas with cocoa butter Suboccipital release, manual  traction with prolonged hold Supine cervical retractions into therapists hands x 10  04/27/2023 Standing Yellow band: rows 10x Standing yellow band: bil shoulder extension 10x Standing yellow band: external rotation 10x Manual therapy: cervical and periscapular musculature Trigger Point Dry Needling Subsequent Treatment: Pt instructed on Dry Needling rational, procedures, and possible side effects. Pt instructed to expect mild to moderate muscle soreness later in the day and/or into the next day.  Pt instructed in methods to reduce muscle soreness. Pt instructed to continue prescribed HEP. Because Dry Needling was performed over or adjacent to a lung field, pt was educated on S/S of pneumothorax and to seek immediate medical attention should they occur.  Patient was educated on signs and symptoms of infection and other risk factors and advised to seek medical attention should they occur.  Patient verbalized understanding of these instructions and education.   Patient Verbal Consent Given: Yes Education Handout Provided: Yes Muscles Treated:  bil rhomboids, bil upper trap, subscapularis and levators  Electrical Stimulation Performed: No Treatment Response/Outcome: twitch and improved tissue mobility   Elongation and release after dry needling   04/26/2023 Cervical rotations and SB x 5 ea Cervical retractions x 10 Scapular retractions x 10 Pectoral stretch in corner 4 x 20 sec STM to bilateral posterior cervicals, UT, levator, and in SL to bilateral scapular areas with cocoa butter Suboccipital release, manual traction with prolonged hold Supine cervical retractions into therapists hands x 10  04/23/2023 Cervical rotations and SB x 5 ea Cervical retractions x 10 Scapular retractions x 10 Pectoral stretch in corner 3 x 20 sec STM to bilateral posterior cervicals, UT, levator, and in SL to bilateral scapular areas with cocoa butter Suboccipital release, manual traction with prolonged  hold Supine cervical retractions into therapists hands x 10 Educated pt in supine to and from sit     PATIENT EDUCATION:  Education details: see above 04/13/2023 Person educated: Patient Education method: Explanation Education comprehension: verbalized understanding and returned demonstration  HOME EXERCISE PROGRAM: Access Code: [email or web address] URL: [email or web address] Date: 04/17/2023 Prepared by: Tresa Endo  Exercises - Sternocleidomastoid Stretch  - 2 x daily - 7 x weekly - 1 sets - 3 reps -  30 hold - Gentle Levator Scapulae Stretch (Mirrored)  - 1 x daily - 7 x weekly - 1 sets - 3 reps - 30 hold - Seated Rhomboid Stretch  - 1 x daily - 7 x weekly - 1 sets - 3 reps - 30 hold - Radial Nerve Flossing (Mirrored)  - 1 x daily - 7 x weekly - 1 sets - 10 reps - Median Nerve Flossing  - 1 x daily - 7 x weekly - 1 sets - 10 reps  ASSESSMENT:  CLINICAL IMPRESSION:  Pt feeling much better overall but continues with multiple shortened areas in bilateral interscapular regions. Pt felt good after treatment. Several very small bruises noted after DN yesterday in posterior shoulders   OBJECTIVE IMPAIRMENTS: decreased activity tolerance, decreased knowledge of condition, decreased ROM, decreased strength, impaired sensation, impaired UE functional use, postural dysfunction, and pain.   ACTIVITY LIMITATIONS: carrying, lifting, sleeping, bed mobility, dressing, and reach over head  PARTICIPATION LIMITATIONS: cleaning, laundry, driving, shopping, and community activity  PERSONAL FACTORS: 3+ comorbidities: Right breast cancer s/p radiation, chronic pain  are also affecting patient's functional outcome.   REHAB POTENTIAL: Good  CLINICAL DECISION MAKING: Evolving/moderate complexity  EVALUATION COMPLEXITY: Moderate  GOALS: Goals reviewed with patient? Yes  SHORT TERM GOALS: Target date: 05/04/2023  Pt will be independent with HEP for ROM and strengthening of neck and  shoulder Baseline: Goal status: MET 04/23/2023 2.  Pt will report overall pain improved by 30% Baseline:  Goal status: MET 04/23/2023 3.  Bilateral shoulder flexion and abduction will be improved to atleast 140 degrees for improved reaching Baseline:  Goal status: MET 04/20/2023 4.  Pt will have quick dash improved by 20 points or more to demonstrate improved function Baseline: 70 Goal status: INITIAL  5.  Right grip strength will improve to 15  lbs for improved ability to perform home activities Baseline:  Goal status: INITIAL   LONG TERM GOALS: Target date: 05/25/2023  Pt will have bilateral shoulder flexion and abd 150 degrees for improved reaching Baseline:  Goal status: Met 04/23/2023 2.  Pt will report overall pain 60% improved or better Baseline:  Goal status: INITIAL  3.  Pt will have right grip strength increased to 20 lbs or more for improved ability to open jars. Baseline:  Goal status: INITIAL  4.  Pt will have quick dash no greater than 20% to demonstrate improved function Baseline:  Goal status: met 4/4  5.  Pt will be able to sleep more comfortably without increased neck pain Baseline:  Goal status: INITIAL   PLAN:  PT FREQUENCY: 3x/week  PT DURATION: 6 weeks   PLANNED INTERVENTIONS: 97164- PT Re-evaluation, 97110-Therapeutic exercises, 97530- Therapeutic activity, 97112- Neuromuscular re-education, 97535- Self Care, 16109- Manual therapy, 97760- Orthotic Fit/training, and Dry Needling  PLAN FOR NEXT SESSION: STM to UT, levator, post. cervicals, scap region,manual traction, stargazer, wall slides, progress ROM as tolerated then strength, postural education,review home traction prn, DN 1x/week  Alvira Monday, PT 05/10/23 9:04 AM Phone: [phone] Fax: [phone]

## 2023-05-17 ENCOUNTER — Ambulatory Visit: Admitting: Physical Therapy

## 2023-05-17 DIAGNOSIS — M25611 Stiffness of right shoulder, not elsewhere classified: Secondary | ICD-10-CM

## 2023-05-17 DIAGNOSIS — M542 Cervicalgia: Secondary | ICD-10-CM

## 2023-05-17 DIAGNOSIS — C50411 Malignant neoplasm of upper-outer quadrant of right female breast: Secondary | ICD-10-CM | POA: Diagnosis not present

## 2023-05-17 DIAGNOSIS — M25612 Stiffness of left shoulder, not elsewhere classified: Secondary | ICD-10-CM

## 2023-05-17 NOTE — Therapy (Signed)
 OUTPATIENT PHYSICAL THERAPY  TREATMENT  Patient Name: Christie Fox MRN: [id number] DOB:1955-03-17, 68 y.o., female Today's Date: 05/17/2023      END OF SESSION:  PT End of Session - 05/17/23 0843     Visit Number 14    Number of Visits 18    Date for PT Re-Evaluation 05/25/23    Authorization Type [employer] 18 visits 3/14-4/25    Authorization Time Period 04/13/2023-05/25/2023    Authorization - Visit Number 14    Authorization - Number of Visits 18    Progress Note Due on Visit 20    PT Start Time 0845    PT Stop Time 0929    PT Time Calculation (min) 44 min    Activity Tolerance Patient tolerated treatment well              Past Medical History:  Diagnosis Date   Anxiety    Arthritis    knees   At risk for sleep apnea    STOP-BANG= 4    SENT TO PCP 07-31-2013   Borderline diabetes    diet controlled   Breast cancer (HCC)    Dysuria    History of kidney stones    Hyperlipidemia    Hypertension    Hypothyroidism    Migraines    PONV (postoperative nausea and vomiting)    "please call me Christie Fox to wake me up , not Christie Fox"    Renal calculus, left    Urgency of urination    Past Surgical History:  Procedure Laterality Date   BREAST LUMPECTOMY WITH RADIOACTIVE SEED LOCALIZATION Right 02/27/2022   Procedure: RIGHT BREAST LUMPECTOMY WITH RADIOACTIVE SEED LOCALIZATION X3;  Surgeon: Christie Harry, MD;  Location: [facility];  Service: General;  Laterality: Right;   CESAREAN SECTION  x2   CYSTOSCOPY WITH RETROGRADE PYELOGRAM, URETEROSCOPY AND STENT PLACEMENT Left 08/04/2013   Procedure: 1ST STAGE CYSTOSCOPY WITH RETROGRADE PYELOGRAM, URETEROSCOPY AND STENT PLACEMENT;  Surgeon: Christie Blaze, MD;  Location: [facility];  Service: Urology;  Laterality: Left;   CYSTOSCOPY WITH RETROGRADE PYELOGRAM, URETEROSCOPY AND STENT PLACEMENT Left 08/20/2013   Procedure: 2ND STAGE CYSTOSCOPY WITH RETROGRADE PYELOGRAM, URETEROSCOPY BASKET STONES AND STENT  EXCHANGE, BLOOD MOP;  Surgeon: Christie Blaze, MD;  Location: [facility];  Service: Urology;  Laterality: Left;   EXPLORATORY LAPAROTOMY W/ BILATERAL SALPINOOPHORECTOMY  11-27-2001   HOLMIUM LASER APPLICATION Left 08/04/2013   Procedure: HOLMIUM LASER APPLICATION;  Surgeon: Christie Blaze, MD;  Location: [facility];  Service: Urology;  Laterality: Left;   LAPAROSCOPIC GASTRIC SLEEVE RESECTION N/A 03/27/2017   Procedure: LAPAROSCOPIC GASTRIC SLEEVE RESECTION WITH UPPER ENDO;  Surgeon: Christie Norlander, MD;  Location: [facility];  Service: General;  Laterality: N/A;   LEFT URETEROSCOPIC LASER LITHO STONE EXTRACTION AND STENT PLACEMENT  03-25-2005   LUMBAR FUSION  X2   LAST ONE 2000   MASTOPEXY Bilateral 12/25/2022   Procedure: MASTOPEXY;  Surgeon: Christie Infield, MD;  Location: [facility];  Service: Plastics;  Laterality: Bilateral;   ORIF LEFT ANKLE FX  03-20-2000   RETAINED HARDWARE   RADIOACTIVE SEED GUIDED EXCISIONAL BREAST BIOPSY Left 02/27/2022   Procedure: RADIOACTIVE SEED GUIDED EXCISIONAL LEFT BREAST BIOPSY;  Surgeon: Christie Harry, MD;  Location: [facility];  Service: General;  Laterality: Left;   VAGINAL HYSTERECTOMY  1999   WISDOM TOOTH EXTRACTION     Patient Active Problem List   Diagnosis Date Noted   Malignant neoplasm of upper outer quadrant  of female breast (HCC) 12/30/2021   Hyperlipidemia 08/19/2014   Hypertension 08/19/2014   Hypothyroidism 08/19/2014   Primary osteoarthritis of both knees 08/19/2014      REFERRING PROVIDER: Mancel Parsons, MD  REFERRING DIAG: Chronic musculoskeletal Pain  THERAPY DIAG:  Cervicalgia  Malignant neoplasm of upper-outer quadrant of right breast in female, estrogen receptor positive (HCC)  Stiffness of right shoulder, not elsewhere classified  Stiffness of left shoulder, not elsewhere classified  ONSET DATE: January 2025  Rationale for Evaluation and Treatment:  Rehabilitation  SUBJECTIVE:                                                                                                                                                                                           SUBJECTIVE STATEMENT:  I do stretching machine every morning. I feel the painful catch this morning with turning my head and looking down.  States the DN helps the pain in managing this issue.  Denies excessive soreness after her sessions.   From evaluation: Pain is all back in my bilateral UT with pain down both arms and into hands right greater than left. My Right posterior shoulder is killing me. I have knots everywhere. I have to run my hands under the water because they are numb, and I get needles in the tips of my fingers. I can't hold a pencil.  I am doing all of my exercises, and use my traction every other day and I can feel the feeling start coming back in my right hand. I get relief for the rest of the afternoon until I go to bed. Laying flat makes everything worse. I bought a husband pillow to sleep at night so my arms are on the arms rest and my head and back are elevated. I let hot water beat on me and that helps. They put me on 20 mg prednisone on Feb 28. I don't see a lot of difference with it. The muscle relaxers help at night, but I don't think the gabapentin does much. She has had some ablations of the genicular nerves in her knee that helped for several weeks, but the pain is back again. My right knuckles hurt. I had bilateral breast lifts 12/24/2022. No problems after that with ROM   PERTINENT HISTORY:  Pt with pain involving her neck and right shoulder, right arm, and right hand greater than left. Patient has a history significant for breast cancer with bilateral Lumpectomies; Right for DCIS, left with a Complex Sclerosing Lesion (Benign) and 0/1 incidental nodes. She had Adjuvant radiation on the right ending on 05/08/2022. Neck pain started 1-3 months after radiation.  Pt  had bilateral breast lifts on 12/24/2022 and she has been doing her exercises since then.Neck pain is sharp, stabbing, aching and constant. Noted to have Diffuse DDD, Trace anterolisthesis at C3-C4 and C4-C5, and 2-3 mm anterolisthesis at C7-T1. Trace retrolisthesis at C6-C7,. Multilevel degenerative disc changes with disc space height loss most pronounced and moderate to severe at C5-C6 and C6-C7,Multilevel uncovertebral spurring and facet arthropathy throughout cervical spine.   PAIN:  Are you having pain? Yes NPRS scale: 3/10  Pain location: bil upper traps and between the shoulder blades Pain orientation: Right  PAIN TYPE: aching, burning, sharp, throbbing, and tingling Pain description: constant  Aggravating factors: sleeping, not sure Relieving factors: traction, warm water running on me  PRECAUTIONS: Prior foot ,ankle (rods and pins) Left LE swelling,, back surgery, gastric sleeve, hysterectomy, pain in bilateral knees   RED FLAGS: Bowel or bladder incontinence: Yes:      WEIGHT BEARING RESTRICTIONS: No  FALLS:  Has patient fallen in last 6 months? 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  COGNITION: Overall cognitive status: Within functional limits for tasks assessed   PALPATION: Tender to palpation bilateral UT,levator, pectorals, posterior cervicals right greater than left  OBSERVATIONS / OTHER ASSESSMENTS: Mild redness dorsum of right hand, pt postures with scapular protraction, and UT elevation  SENSATION: Light touch: Deficits all fingers of right hand with decreased/altered sensation and fingertips of both hands    POSTURE: forward head, rounded shoulders, UT  elevation  UPPER EXTREMITY AROM/PROM:  A/PROM RIGHT   eval  RIGHT 04/20/2023 Right  4/4  Shoulder extension 53, pain post shoulder     Shoulder flexion 132, pain top and under arm 160 160  Shoulder abduction 120, pain chest and under arm 168   Shoulder internal rotation 63    Shoulder external rotation 80, under arm, chest, shoulder      (Blank rows = not tested)  A/PROM LEFT   eval LEFT 04/20/2023 Left 4/4  Shoulder extension 55    Shoulder flexion 132 163 163  Shoulder abduction 132, pain under arm and top of arm 164   Shoulder internal rotation 64    Shoulder external rotation 85      (Blank rows = not tested)  CERVICAL AROM:     Percent limited 4/4  Flexion WNL, tight upper back/shoulders 55  Extension Dec 50%, increased pain neck, arms 46  Right lateral flexion Dec 20%, pain right neck/shoulder 48  Left lateral flexion Very tight/painful left UT 42  Right rotation Decreased 60%, pain right neck 55  Left rotation WFL, no pain 60    UPPER EXTREMITY STRENGTH: GRIP #2: Left 35,27              Right  8,5           Pincer grip Left  3,3               Right 2,1  LYMPHEDEMA ASSESSMENTS:   SURGERY TYPE/DATE:02/27/2022 Bilateral breast lumpectomies,  12/25/2022 Bilateral Mastopexy  NUMBER OF LYMPH NODES REMOVED: Left 0/1 incidental LN Right 0 LN's  CHEMOTHERAPY: NO  RADIATION:Adjuvant ended 05/08/2022   HORMONE TREATMENT: NO  INFECTIONS: NO    GRIP: left 35,27  right  8,5 Pincer grip  3,3                   Right 2,1   FUNCTIONAL TESTS:    GAIT: WNL    QUICK DASH SURVEY: 70% 4/4: 18.2%                                                                                                                            TREATMENT DATE:  05/17/2023 Review of status and current self treatment strategies and exercise program Discussion of consistency of ex's to generate blood flow to myofascia Manual therapy: cervical and periscapular musculature Trigger  Point Dry Needling Subsequent Treatment: Pt instructed on Dry Needling rational, procedures, and possible side effects. Pt instructed to expect mild to moderate muscle soreness later in the day and/or into the next day.  Pt instructed in methods to reduce muscle soreness. Pt instructed to continue prescribed HEP. Because Dry Needling was performed over or adjacent to a lung field, pt was educated on S/S of pneumothorax and to seek immediate medical attention should they occur.  Patient was educated on signs and symptoms of infection and other risk factors and advised to seek medical attention should they occur.  Patient verbalized understanding of these instructions and education.  Patient Verbal Consent Given: Yes Education Handout Provided: Yes Muscles Treated:  bil rhomboids, bil upper trap, bil levators; bil infraspinatus, bil teres, bil subscapularis Electrical Stimulation Performed: No Treatment Response/Outcome: twitch and improved tissue mobility   Elongation and release after dry needling   05/10/2023 Ball rolls on wall x 10 forward, 5 abd B Jobes flexion and scaption 1# x 10 Standing horizontal abd with yellow band x 10 Supine bilateral scaption x 10, horizontal abd x 5 with 5 sec. Hold, Snow angels x 5 10 sec hold STM to bilateral posterior cervicals, UT, levator, and in SL to bilateral scapular, lats, levator areas with cocoa butter Suboccipital release, manual traction with prolonged hold   05/09/2023 Review of status Discussion on benefit of not only stretching but band ex's to generate blood flow/reduce ischemia to soft tissue Tennis ball "pinning" trigger point against wall with UE elevation and horizontal abduction/adduction Manual therapy: cervical and periscapular musculature Trigger Point Dry Needling Subsequent Treatment: Pt instructed on Dry Needling rational, procedures, and possible side effects. Pt instructed to expect mild to moderate muscle soreness later in  the day and/or into the next day.  Pt instructed in methods to reduce muscle soreness. Pt instructed to continue prescribed HEP. Because Dry Needling was performed over or adjacent to a lung field, pt was educated on S/S of pneumothorax and to seek immediate medical attention should they occur.  Patient was educated on signs and symptoms of infection and other risk factors and advised to seek medical attention should they occur.  Patient verbalized understanding of these instructions and education.  Patient Verbal Consent Given: Yes Education Handout Provided: Yes Muscles Treated:  bil rhomboids, bil  upper trap, bil levators; bil infraspinatus, bil teres, bil subscapularis Electrical Stimulation Performed: No Treatment Response/Outcome: twitch and improved tissue mobility   Elongation and release after dry needling  05/07/2023 Ball rolls on wall x 10 forward, abd x 5 on right Wall push ups x 10 Jobes flexion and scaption x 10  1# STM to bilateral posterior cervicals, UT, levator, and in SL to bilateral scapular, lats, levator areas with cocoa butter Suboccipital release, manual traction with prolonged hold Supine cervical retractions into therapists hands x 10  05/04/2023 Quick DASH Cervical ROM Shoulder ROM Review of shoulder band ex's Manual therapy: cervical and periscapular musculature Trigger Point Dry Needling Subsequent Treatment: Pt instructed on Dry Needling rational, procedures, and possible side effects. Pt instructed to expect mild to moderate muscle soreness later in the day and/or into the next day.  Pt instructed in methods to reduce muscle soreness. Pt instructed to continue prescribed HEP. Because Dry Needling was performed over or adjacent to a lung field, pt was educated on S/S of pneumothorax and to seek immediate medical attention should they occur.  Patient was educated on signs and symptoms of infection and other risk factors and advised to seek medical attention  should they occur.  Patient verbalized understanding of these instructions and education.  Patient Verbal Consent Given: Yes Education Handout Provided: Yes Muscles Treated:  bil rhomboids, bil upper trap, bil levators; bil infraspinatus, bil teres Electrical Stimulation Performed: No Treatment Response/Outcome: twitch and improved tissue mobility   Elongation and release after dry needling  05/02/2023 Fingers in median nerve distribution get numb and tingly, and top of right hand.  When she does the traction her hands are temporarily better. STM to bilateral posterior cervicals, UT, levator, and in SL to bilateral scapular, lats, levator areas with cocoa butter Suboccipital release, manual traction with prolonged hold Supine cervical retractions into therapists hands x 10 Supine scapular series with yellow band scaption, horizontal abd, ER, sword with thumb up  B all x 5   04/30/2023 Reviewed standing theraband with yellow ;scapular retraction, extension, bilateral ER all x 10 Supine horizontal abd x 10, diagonals x 5 ea supine STM to bilateral posterior cervicals, UT, levator, and in SL to bilateral scapular, lats, levator areas with cocoa butter Suboccipital release, manual traction with prolonged hold Supine cervical retractions into therapists hands x 10  04/27/2023 Standing Yellow band: rows 10x Standing yellow band: bil shoulder extension 10x Standing yellow band: external rotation 10x Manual therapy: cervical and periscapular musculature Trigger Point Dry Needling Subsequent Treatment: Pt instructed on Dry Needling rational, procedures, and possible side effects. Pt instructed to expect mild to moderate muscle soreness later in the day and/or into the next day.  Pt instructed in methods to reduce muscle soreness. Pt instructed to continue prescribed HEP. Because Dry Needling was performed over or adjacent to a lung field, pt was educated on S/S of pneumothorax and to seek  immediate medical attention should they occur.  Patient was educated on signs and symptoms of infection and other risk factors and advised to seek medical attention should they occur.  Patient verbalized understanding of these instructions and education.   Patient Verbal Consent Given: Yes Education Handout Provided: Yes Muscles Treated:  bil rhomboids, bil upper trap, subscapularis and levators  Electrical Stimulation Performed: No Treatment Response/Outcome: twitch and improved tissue mobility   Elongation and release after dry needling     PATIENT EDUCATION:  Education details: see above 04/13/2023 Person educated: Patient Education method: Explanation  Education comprehension: verbalized understanding and returned demonstration  HOME EXERCISE PROGRAM: Access Code: [email or web address] URL: [email or web address] Date: 04/17/2023 Prepared by: Tresa Endo  Exercises - Sternocleidomastoid Stretch  - 2 x daily - 7 x weekly - 1 sets - 3 reps - 30 hold - Gentle Levator Scapulae Stretch (Mirrored)  - 1 x daily - 7 x weekly - 1 sets - 3 reps - 30 hold - Seated Rhomboid Stretch  - 1 x daily - 7 x weekly - 1 sets - 3 reps - 30 hold - Radial Nerve Flossing (Mirrored)  - 1 x daily - 7 x weekly - 1 sets - 10 reps - Median Nerve Flossing  - 1 x daily - 7 x weekly - 1 sets - 10 reps  ASSESSMENT:  CLINICAL IMPRESSION: The patient benefits significantly from dry needling and manual therapy to stimulate underlying myofascial trigger points and muscular tissue for management of ongoing musculoskeletal pain.  Much improved soft tissue mobility and decreased tender point size and number following treatment session with patient demonstrating cervical rotation and flexion without painful catching.  Therapist closely monitoring response throughout session and providing education on home self care strategies for long term benefit from treatment.     OBJECTIVE IMPAIRMENTS: decreased activity tolerance,  decreased knowledge of condition, decreased ROM, decreased strength, impaired sensation, impaired UE functional use, postural dysfunction, and pain.   ACTIVITY LIMITATIONS: carrying, lifting, sleeping, bed mobility, dressing, and reach over head  PARTICIPATION LIMITATIONS: cleaning, laundry, driving, shopping, and community activity  PERSONAL FACTORS: 3+ comorbidities: Right breast cancer s/p radiation, chronic pain  are also affecting patient's functional outcome.   REHAB POTENTIAL: Good  CLINICAL DECISION MAKING: Evolving/moderate complexity  EVALUATION COMPLEXITY: Moderate  GOALS: Goals reviewed with patient? Yes  SHORT TERM GOALS: Target date: 05/04/2023  Pt will be independent with HEP for ROM and strengthening of neck and shoulder Baseline: Goal status: MET 04/23/2023 2.  Pt will report overall pain improved by 30% Baseline:  Goal status: MET 04/23/2023 3.  Bilateral shoulder flexion and abduction will be improved to atleast 140 degrees for improved reaching Baseline:  Goal status: MET 04/20/2023 4.  Pt will have quick dash improved by 20 points or more to demonstrate improved function Baseline: 70 Goal status: INITIAL  5.  Right grip strength will improve to 15  lbs for improved ability to perform home activities Baseline:  Goal status: INITIAL   LONG TERM GOALS: Target date: 05/25/2023  Pt will have bilateral shoulder flexion and abd 150 degrees for improved reaching Baseline:  Goal status: Met 04/23/2023 2.  Pt will report overall pain 60% improved or better Baseline:  Goal status: INITIAL  3.  Pt will have right grip strength increased to 20 lbs or more for improved ability to open jars. Baseline:  Goal status: INITIAL  4.  Pt will have quick dash no greater than 20% to demonstrate improved function Baseline:  Goal status: met 4/4  5.  Pt will be able to sleep more comfortably without increased neck pain Baseline:  Goal status: INITIAL   PLAN:  PT  FREQUENCY: 3x/week  PT DURATION: 6 weeks   PLANNED INTERVENTIONS: 97164- PT Re-evaluation, 97110-Therapeutic exercises, 97530- Therapeutic activity, 97112- Neuromuscular re-education, 97535- Self Care, 25366- Manual therapy, 97760- Orthotic Fit/training, and Dry Needling  PLAN FOR NEXT SESSION: STM to UT, levator, post. cervicals, scap region,manual traction, stargazer, wall slides, progress ROM as tolerated then strength, postural education,review home traction prn, DN 1x/week  Lavinia Sharps, PT 05/17/23 3:09 PM  Phone: [phone] Fax: [phone]

## 2023-05-23 ENCOUNTER — Ambulatory Visit

## 2023-05-23 DIAGNOSIS — R293 Abnormal posture: Secondary | ICD-10-CM

## 2023-05-23 DIAGNOSIS — C50411 Malignant neoplasm of upper-outer quadrant of right female breast: Secondary | ICD-10-CM | POA: Diagnosis not present

## 2023-05-23 DIAGNOSIS — M542 Cervicalgia: Secondary | ICD-10-CM

## 2023-05-23 DIAGNOSIS — M25611 Stiffness of right shoulder, not elsewhere classified: Secondary | ICD-10-CM

## 2023-05-23 DIAGNOSIS — G8929 Other chronic pain: Secondary | ICD-10-CM

## 2023-05-23 DIAGNOSIS — Z170 Estrogen receptor positive status [ER+]: Secondary | ICD-10-CM

## 2023-05-23 DIAGNOSIS — M25612 Stiffness of left shoulder, not elsewhere classified: Secondary | ICD-10-CM

## 2023-05-23 NOTE — Therapy (Addendum)
 OUTPATIENT PHYSICAL THERAPY  TREATMENT  Patient Name: Christie Fox MRN: [id number] DOB:05/18/1955, 68 y.o., female Today's Date: 05/23/2023      END OF SESSION:  PT End of Session - 05/23/23 0906     Visit Number 15    Number of Visits 18    Date for PT Re-Evaluation 05/25/23    Authorization Type [employer] 18 visits 3/14-4/25    Authorization Time Period 04/13/2023-05/25/2023    Authorization - Visit Number 15    Authorization - Number of Visits 18    Progress Note Due on Visit 20    PT Start Time 0903    PT Stop Time 1004    PT Time Calculation (min) 61 min    Activity Tolerance Patient tolerated treatment well    Behavior During Therapy WFL for tasks assessed/performed              Past Medical History:  Diagnosis Date   Anxiety    Arthritis    knees   At risk for sleep apnea    STOP-BANG= 4    SENT TO PCP 07-31-2013   Borderline diabetes    diet controlled   Breast cancer (HCC)    Dysuria    History of kidney stones    Hyperlipidemia    Hypertension    Hypothyroidism    Migraines    PONV (postoperative nausea and vomiting)    "please call me [name] to wake me up , not [name]"    Renal calculus, left    Urgency of urination    Past Surgical History:  Procedure Laterality Date   BREAST LUMPECTOMY WITH RADIOACTIVE SEED LOCALIZATION Right 02/27/2022   Procedure: RIGHT BREAST LUMPECTOMY WITH RADIOACTIVE SEED LOCALIZATION X3;  Surgeon: Enid Harry, MD;  Location: [facility];  Service: General;  Laterality: Right;   CESAREAN SECTION  x2   CYSTOSCOPY WITH RETROGRADE PYELOGRAM, URETEROSCOPY AND STENT PLACEMENT Left 08/04/2013   Procedure: 1ST STAGE CYSTOSCOPY WITH RETROGRADE PYELOGRAM, URETEROSCOPY AND STENT PLACEMENT;  Surgeon: Osborn Blaze, MD;  Location: [facility];  Service: Urology;  Laterality: Left;   CYSTOSCOPY WITH RETROGRADE PYELOGRAM, URETEROSCOPY AND STENT PLACEMENT Left 08/20/2013   Procedure: 2ND STAGE CYSTOSCOPY WITH  RETROGRADE PYELOGRAM, URETEROSCOPY BASKET STONES AND STENT EXCHANGE, BLOOD MOP;  Surgeon: Osborn Blaze, MD;  Location: [facility];  Service: Urology;  Laterality: Left;   EXPLORATORY LAPAROTOMY W/ BILATERAL SALPINOOPHORECTOMY  11-27-2001   HOLMIUM LASER APPLICATION Left 08/04/2013   Procedure: HOLMIUM LASER APPLICATION;  Surgeon: Osborn Blaze, MD;  Location: [facility];  Service: Urology;  Laterality: Left;   LAPAROSCOPIC GASTRIC SLEEVE RESECTION N/A 03/27/2017   Procedure: LAPAROSCOPIC GASTRIC SLEEVE RESECTION WITH UPPER ENDO;  Surgeon: Juanita Norlander, MD;  Location: [facility];  Service: General;  Laterality: N/A;   LEFT URETEROSCOPIC LASER LITHO STONE EXTRACTION AND STENT PLACEMENT  03-25-2005   LUMBAR FUSION  X2   LAST ONE 2000   MASTOPEXY Bilateral 12/25/2022   Procedure: MASTOPEXY;  Surgeon: Alger Infield, MD;  Location: [facility];  Service: Plastics;  Laterality: Bilateral;   ORIF LEFT ANKLE FX  03-20-2000   RETAINED HARDWARE   RADIOACTIVE SEED GUIDED EXCISIONAL BREAST BIOPSY Left 02/27/2022   Procedure: RADIOACTIVE SEED GUIDED EXCISIONAL LEFT BREAST BIOPSY;  Surgeon: Enid Harry, MD;  Location: [facility];  Service: General;  Laterality: Left;   VAGINAL HYSTERECTOMY  1999   WISDOM TOOTH EXTRACTION     Patient Active Problem List   Diagnosis  Date Noted   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) 12/30/2021   Hyperlipidemia 08/19/2014   Hypertension 08/19/2014   Hypothyroidism 08/19/2014   Primary osteoarthritis of both knees 08/19/2014      REFERRING PROVIDER: Trudi Fus, MD  REFERRING DIAG: Chronic musculoskeletal Pain  THERAPY DIAG:  Cervicalgia  Malignant neoplasm of upper-outer quadrant of right breast in female, estrogen receptor positive (HCC)  Stiffness of right shoulder, not elsewhere classified  Stiffness of left shoulder, not elsewhere classified  Chronic musculoskeletal  pain  Abnormal posture  ONSET DATE: January 2025  Rationale for Evaluation and Treatment: Rehabilitation  SUBJECTIVE:                                                                                                                                                                                           SUBJECTIVE STATEMENT:  I've got all my exercises to do at home and I'm using my traction unit. So this will be my last day because of my insurance.    From evaluation: Pain is all back in my bilateral UT with pain down both arms and into hands right greater than left. My Right posterior shoulder is killing me. I have knots everywhere. I have to run my hands under the water because they are numb, and I get needles in the tips of my fingers. I can't hold a pencil.  I am doing all of my exercises, and use my traction every other day and I can feel the feeling start coming back in my right hand. I get relief for the rest of the afternoon until I go to bed. Laying flat makes everything worse. I bought a husband pillow to sleep at night so my arms are on the arms rest and my head and back are elevated. I let hot water beat on me and that helps. They put me on 20 mg prednisone on Feb 28. I don't see a lot of difference with it. The muscle relaxers help at night, but I don't think the gabapentin does much. She has had some ablations of the genicular nerves in her knee that helped for several weeks, but the pain is back again. My right knuckles hurt. I had bilateral breast lifts 12/24/2022. No problems after that with ROM   PERTINENT HISTORY:  Pt with pain involving her neck and right shoulder, right arm, and right hand greater than left. Patient has a history significant for breast cancer with bilateral Lumpectomies; Right for DCIS, left with a Complex Sclerosing Lesion (Benign) and 0/1 incidental nodes. She had Adjuvant radiation on the right ending on 05/08/2022. Neck pain  started 1-3 months after radiation.   Pt had bilateral breast lifts on 12/24/2022 and she has been doing her exercises since then.Neck pain is sharp, stabbing, aching and constant. Noted to have Diffuse DDD, Trace anterolisthesis at C3-C4 and C4-C5, and 2-3 mm anterolisthesis at C7-T1. Trace retrolisthesis at C6-C7,. Multilevel degenerative disc changes with disc space height loss most pronounced and moderate to severe at C5-C6 and C6-C7,Multilevel uncovertebral spurring and facet arthropathy throughout cervical spine.   PAIN:  Are you having pain? Yes NPRS scale: 2/10  Pain location: bil upper traps and between the shoulder blades Pain orientation: Right  PAIN TYPE: sore Pain description: intermittent Aggravating factors: falling asleep in chair in wrong position Relieving factors: traction, warm water running on me  PRECAUTIONS: Prior foot ,ankle (rods and pins) Left LE swelling,, back surgery, gastric sleeve, hysterectomy, pain in bilateral knees   RED FLAGS: Bowel or bladder incontinence: Yes:      WEIGHT BEARING RESTRICTIONS: No  FALLS:  Has patient fallen in last 6 months? 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  COGNITION: Overall cognitive status: Within functional limits for tasks assessed   PALPATION: Tender to palpation bilateral UT,levator, pectorals, posterior cervicals right greater than left  OBSERVATIONS / OTHER ASSESSMENTS: Mild redness dorsum of right hand, pt postures with scapular protraction, and UT elevation  SENSATION: Light touch: Deficits all fingers of right hand with decreased/altered sensation and fingertips of both hands    POSTURE: forward head, rounded shoulders, UT  elevation  UPPER EXTREMITY AROM/PROM:  A/PROM RIGHT   eval  RIGHT 04/20/2023 Right  4/4 Right 05/23/23  Shoulder extension 53, pain post shoulder      Shoulder flexion 132, pain top and under arm 160 160 172  Shoulder abduction 120, pain chest and under arm 168  177  Shoulder internal rotation 63     Shoulder external rotation 80, under arm, chest, shoulder       (Blank rows = not tested)  A/PROM LEFT   eval LEFT 04/20/2023 Left 4/4 Left  05/23/23  Shoulder extension 55     Shoulder flexion 132 163 163 163  Shoulder abduction 132, pain under arm and top of arm 164  178  Shoulder internal rotation 64     Shoulder external rotation 85       (Blank rows = not tested)  CERVICAL AROM:     Percent limited 4/4  Flexion WNL, tight upper back/shoulders 55  Extension Dec 50%, increased pain neck, arms 46  Right lateral flexion Dec 20%, pain right neck/shoulder 48  Left lateral flexion Very tight/painful left UT 42  Right rotation Decreased 60%, pain right neck 55  Left rotation WFL, no pain 60    UPPER EXTREMITY STRENGTH: GRIP #2: Left 35,27              Right  8,5              Pincer grip Left  3,3               Right 2,1  05/23/23 Grip #2: Left 41 Right 43  Pincer Grip Left 4 Right 4  LYMPHEDEMA ASSESSMENTS:   SURGERY TYPE/DATE:02/27/2022 Bilateral breast lumpectomies,  12/25/2022 Bilateral Mastopexy  NUMBER OF LYMPH NODES REMOVED: Left 0/1 incidental LN Right 0 LN's  CHEMOTHERAPY: NO  RADIATION:Adjuvant ended 05/08/2022   HORMONE TREATMENT: NO  INFECTIONS: NO    GRIP: left 35,27              right  8,5 Pincer grip  3,3                   Right 2,1   FUNCTIONAL TESTS:    GAIT: WNL    QUICK DASH SURVEY: 70% 4/4: 18.2%                                                                                                                            TREATMENT DATE:  05/23/23: Self Care Spent time at beginning of session reviewing pt current HEP and reviewing what  is working or her and importance of continuing to maintain correct posture with all activities/ADLs. Also assessed goals and updated ROM/strength, see above.  Manual Therapy STM to bilateral posterior cervicals, UT, levator, and in SL to bilateral scapular, lats, levator areas with cocoa butter Suboccipital release, manual traction with prolonged hold  05/17/2023 Review of status and current self treatment strategies and exercise program Discussion of consistency of ex's to generate blood flow to myofascia Manual therapy: cervical and periscapular musculature Trigger Point Dry Needling Subsequent Treatment: Pt instructed on Dry Needling rational, procedures, and possible side effects. Pt instructed to expect mild to moderate muscle soreness later in the day and/or into the next day.  Pt instructed in methods to reduce muscle soreness. Pt instructed to continue prescribed HEP. Because Dry Needling was performed over or adjacent to a lung field, pt was educated on S/S of pneumothorax and to seek immediate medical attention should they occur.  Patient was educated on signs and symptoms of infection and other risk factors and advised to seek medical attention should they occur.  Patient verbalized understanding of these instructions and education.  Patient Verbal Consent Given: Yes Education Handout Provided: Yes Muscles Treated:  bil rhomboids, bil upper trap, bil levators; bil infraspinatus, bil teres, bil subscapularis Electrical Stimulation Performed: No Treatment Response/Outcome: twitch and improved tissue mobility   Elongation and release after dry needling   05/10/2023 Ball rolls on wall x 10 forward, 5 abd B Jobes flexion and scaption 1# x 10 Standing horizontal abd with yellow band x 10 Supine bilateral scaption x 10, horizontal abd x 5 with 5 sec. Hold, Snow angels x 5 10 sec hold STM to bilateral posterior cervicals, UT, levator, and in SL to bilateral scapular, lats, levator  areas with cocoa butter Suboccipital release, manual traction with prolonged hold   05/09/2023 Review of status Discussion on benefit of not only stretching but band ex's to generate blood flow/reduce ischemia to soft tissue Tennis ball "pinning" trigger point against wall with UE elevation and horizontal abduction/adduction Manual therapy: cervical and periscapular musculature Trigger Point Dry Needling Subsequent Treatment: Pt instructed on Dry Needling  rational, procedures, and possible side effects. Pt instructed to expect mild to moderate muscle soreness later in the day and/or into the next day.  Pt instructed in methods to reduce muscle soreness. Pt instructed to continue prescribed HEP. Because Dry Needling was performed over or adjacent to a lung field, pt was educated on S/S of pneumothorax and to seek immediate medical attention should they occur.  Patient was educated on signs and symptoms of infection and other risk factors and advised to seek medical attention should they occur.  Patient verbalized understanding of these instructions and education.  Patient Verbal Consent Given: Yes Education Handout Provided: Yes Muscles Treated:  bil rhomboids, bil upper trap, bil levators; bil infraspinatus, bil teres, bil subscapularis Electrical Stimulation Performed: No Treatment Response/Outcome: twitch and improved tissue mobility   Elongation and release after dry needling     PATIENT EDUCATION:  Education details: see above 04/13/2023 Person educated: Patient Education method: Explanation Education comprehension: verbalized understanding and returned demonstration  HOME EXERCISE PROGRAM: Access Code: [email or web address] URL: [email or web address] Date: 04/17/2023 Prepared by: Loetta Ringer  Exercises - Sternocleidomastoid Stretch  - 2 x daily - 7 x weekly - 1 sets - 3 reps - 30 hold - Gentle Levator Scapulae Stretch (Mirrored)  - 1 x daily - 7 x weekly - 1 sets - 3 reps - 30  hold - Seated Rhomboid Stretch  - 1 x daily - 7 x weekly - 1 sets - 3 reps - 30 hold - Radial Nerve Flossing (Mirrored)  - 1 x daily - 7 x weekly - 1 sets - 10 reps - Median Nerve Flossing  - 1 x daily - 7 x weekly - 1 sets - 10 reps  ASSESSMENT:  CLINICAL IMPRESSION: Pt has done excellent this episode of therapy and met all her goals. She is ready for D/C at this time.     OBJECTIVE IMPAIRMENTS: decreased activity tolerance, decreased knowledge of condition, decreased ROM, decreased strength, impaired sensation, impaired UE functional use, postural dysfunction, and pain.   ACTIVITY LIMITATIONS: carrying, lifting, sleeping, bed mobility, dressing, and reach over head  PARTICIPATION LIMITATIONS: cleaning, laundry, driving, shopping, and community activity  PERSONAL FACTORS: 3+ comorbidities: Right breast cancer s/p radiation, chronic pain  are also affecting patient's functional outcome.   REHAB POTENTIAL: Good  CLINICAL DECISION MAKING: Evolving/moderate complexity  EVALUATION COMPLEXITY: Moderate  GOALS: Goals reviewed with patient? Yes  SHORT TERM GOALS: Target date: 05/04/2023  Pt will be independent with HEP for ROM and strengthening of neck and shoulder Baseline: Goal status: MET 04/23/2023 2.  Pt will report overall pain improved by 30% Baseline:  Goal status: MET 04/23/2023 3.  Bilateral shoulder flexion and abduction will be improved to atleast 140 degrees for improved reaching Baseline:  Goal status: MET 04/20/2023 4.  Pt will have quick dash improved by 20 points or more to demonstrate improved function Baseline: 70 Goal status: MET  5.  Right grip strength will improve to 15  lbs for improved ability to perform home activities Baseline:  Goal status: MET   LONG TERM GOALS: Target date: 05/25/2023  Pt will have bilateral shoulder flexion and abd 150 degrees for improved reaching Baseline:  Goal status: Met 04/23/2023  2.  Pt will report overall pain 60%  improved or better Baseline: 05/23/23 - 85% improvement reported at this time Goal status: MET  3.  Pt will have right grip strength increased to 20 lbs or more for improved ability to open jars. Baseline: 05/23/23 - 43#  Goal status: MET  4.  Pt will have quick dash no greater than 20% to demonstrate improved function Baseline:  Goal status: met 4/4  5.  Pt will be able to sleep more comfortably without increased neck pain Baseline:  Goal status: MET   PLAN:  PT FREQUENCY: 3x/week  PT DURATION: 6 weeks   PLANNED INTERVENTIONS: 97164- PT Re-evaluation, 97110-Therapeutic exercises, 97530- Therapeutic activity, 97112- Neuromuscular re-education, 97535- Self Care, 81191- Manual therapy, 97760- Orthotic Fit/training, and Dry Needling  PLAN FOR NEXT SESSION: D/C this visit.  PHYSICAL THERAPY DISCHARGE SUMMARY  Visits from Start of Care: 15  Current functional level related to goals / functional outcomes: Achieved all goals   Remaining deficits: none   Education / Equipment: HEP   Patient agrees to discharge. Patient goals were met. Patient is being discharged due to meeting the stated rehab goals.  Sharon December, PT 05/23/23 11:13 AM  Roslynn Coombes, PTA 05/23/23 10:07 AM Phone: [phone] Fax: [phone]

## 2023-06-27 NOTE — H&P (Signed)
 Subjective Patient ID: Christie Fox is a 68 y.o. female.     HPI   6 months post op bilateral mastopexy. Reports went to second to [place], no bra suitable fit, reports the inserts roll up and need to be adjusted. She would like to move forward with augmentation.   Diagnosed with right breast cancer 2023 following screening MMG. Underwent bilateral breast lumpectomy 01/2022 for final left breast CSL, right breast medial DCIS intermediate grade, right breast lateral 1 cm and 4 mm area of IDC with DCIS, margins clear. ER/PR+, Her2-   Oncotype 22. Completed adjuvant RT 4.8.2024. Patient declined Anastrozole. Since last visit she has also declined any further Oncology follow up.   MMG 11/01/2022 demonstrated a 0.8 cm left breast mass 10 o clock, 4 cmfn. Mildly prominent left axillary LN noted that is to stable mammographically over years. 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 6 month follow MMG recommended. This was completed 05/11/2023, benign. Recommend 6 mo follow up MMG to resume annual screening MMG.   Mother with breast ca.    Prior to lumpectomy B cup. Reports loss volume post treatment and using insert bilateral. Reports right volume "nothing" and desires larger.  Right mastopexy 12 g Left mastopexy 63 g Pathology right breast benign, left breast ALH, papilloma present.    Highest weight 270 lb. Undwent sleeve gastroplasty 2019.    Review of Systems   Objective Physical Exam  Cardiovascular: Normal rate, regular rhythm and normal heart sounds.    Pulmonary/Chest Effort normal and breath sounds normal.        Breasts:  scars flat SN to nipple R 23.5 L 24 cm BW R 22 L 22 cm Nipple to IMF R 7 cm to scar, 8 cm to fold L 8 cm Contour right lower pole contracted Soft tissue pinch >4 cm   Assessment/Plan History of right breast cancer  s/p lumpectomy History of therapeutic radiation S/p bilateral mastopexy     Patient desires larger volume and our plan  bilateral augmentation.   Reviewed implants are not permanent devices, additional surgery related to them common. Reviewed implant risks including rupture and contracture, latter would be greater risk in setting RT. Discussed saline vs silicone. Reviewed imaging surveillance for silicone implants. Reviewed increased risks in setting RT including wound healing problems; this may manifest at implant extrusion or exposure in setting implant based reconstruction. Patient has elected for saline, plan smooth round. Reviewed use IMF scar. Completed physician patient checklist for Natrelle Saline. Completed sizing last visit and liked 350 ml.    Reviewed implant placement alone on right I do not anticipate will correct the soft tissue contraction lower pole related to radiation fibrosis. If she is satisfied with overall volume than can consider fat grafting to right breast lower pole to address contour. Reviewed variable take graft, may need to repeat procedure, fat necrosis that presents as masses. No plans for this presently.   We discussed location implant placement pre or subpectoral. Latter has some evidence of decreased risk capsular contracture but given the muscle has been radiated this data does not apply to this breast. I recommend prepectoral position as I do not feel radiated pectoralis will stretch, limit pain. Patient agrees to this.   Soft tissue rolls lateral chest wall. Plan liposuction bilateral lateral chest wall to aid with contour. Reviewed need for compression, expected pain. Reviewed at her age the skin may not contract as well as desired. Desires to proceed.   Additional risks including  but not limited to bleeding, hematoma, seroma, damage to adjacent structures, asymmetry, need for additional procedures, infection requiring removal implants, unacceptable cosmetic result, blood clots in legs or lungs reviewed.   Rx for oxycodone , doxycycline given.  Alger Infield, MD [facility]  Office/ physician access line after hours [phone]

## 2023-07-16 ENCOUNTER — Other Ambulatory Visit: Payer: Self-pay

## 2023-07-16 ENCOUNTER — Encounter (HOSPITAL_BASED_OUTPATIENT_CLINIC_OR_DEPARTMENT_OTHER): Payer: Self-pay | Admitting: Plastic Surgery

## 2023-07-19 ENCOUNTER — Encounter (HOSPITAL_BASED_OUTPATIENT_CLINIC_OR_DEPARTMENT_OTHER)
Admission: RE | Admit: 2023-07-19 | Discharge: 2023-07-19 | Disposition: A | Discharge status: Home / Self Care | Source: Ambulatory Visit | Attending: Plastic Surgery | Admitting: Plastic Surgery

## 2023-07-19 DIAGNOSIS — I10 Essential (primary) hypertension: Secondary | ICD-10-CM | POA: Insufficient documentation

## 2023-07-19 DIAGNOSIS — Z01818 Encounter for other preprocedural examination: Secondary | ICD-10-CM | POA: Insufficient documentation

## 2023-07-19 DIAGNOSIS — Z01810 Encounter for preprocedural cardiovascular examination: Secondary | ICD-10-CM | POA: Diagnosis present

## 2023-07-19 DIAGNOSIS — Z01812 Encounter for preprocedural laboratory examination: Secondary | ICD-10-CM | POA: Diagnosis present

## 2023-07-19 LAB — BASIC METABOLIC PANEL WITH GFR
Anion gap: 16 — ABNORMAL HIGH (ref 5–15)
BUN: 16 mg/dL (ref 8–23)
CO2: 26 mmol/L (ref 22–32)
Calcium: 9.2 mg/dL (ref 8.9–10.3)
Chloride: 95 mmol/L — ABNORMAL LOW (ref 98–111)
Creatinine, Ser: 0.87 mg/dL (ref 0.44–1.00)
GFR, Estimated: >60 mL/min (ref >60)
Glucose, Bld: 87 mg/dL (ref 70–99)
Potassium: 3.5 mmol/L (ref 3.5–5.1)
Sodium: 137 mmol/L (ref 135–145)

## 2023-07-19 MED ORDER — CHLORHEXIDINE GLUCONATE CLOTH 2 % EX PADS: 6.0000 | MEDICATED_PAD | Freq: Once | CUTANEOUS | Status: DC

## 2023-07-19 NOTE — Progress Notes (Signed)
Surgical soap given with instructions, pt verbalized understanding.  

## 2023-07-24 ENCOUNTER — Encounter (HOSPITAL_BASED_OUTPATIENT_CLINIC_OR_DEPARTMENT_OTHER): Payer: Self-pay | Admitting: Plastic Surgery

## 2023-07-24 ENCOUNTER — Other Ambulatory Visit: Payer: Self-pay

## 2023-07-24 ENCOUNTER — Encounter (HOSPITAL_BASED_OUTPATIENT_CLINIC_OR_DEPARTMENT_OTHER)
Admission: RE | Disposition: A | Discharge status: Home / Self Care | Payer: Self-pay | Source: Home / Self Care | Attending: Plastic Surgery

## 2023-07-24 ENCOUNTER — Ambulatory Visit (HOSPITAL_BASED_OUTPATIENT_CLINIC_OR_DEPARTMENT_OTHER)

## 2023-07-24 ENCOUNTER — Ambulatory Visit (HOSPITAL_BASED_OUTPATIENT_CLINIC_OR_DEPARTMENT_OTHER)
Admission: RE | Admit: 2023-07-24 | Discharge: 2023-07-24 | Disposition: A | Discharge status: Home / Self Care | Attending: Plastic Surgery | Admitting: Plastic Surgery

## 2023-07-24 DIAGNOSIS — Z9884 Bariatric surgery status: Secondary | ICD-10-CM | POA: Insufficient documentation

## 2023-07-24 DIAGNOSIS — Z853 Personal history of malignant neoplasm of breast: Secondary | ICD-10-CM | POA: Diagnosis present

## 2023-07-24 DIAGNOSIS — R7303 Prediabetes: Secondary | ICD-10-CM | POA: Diagnosis not present

## 2023-07-24 DIAGNOSIS — Z421 Encounter for breast reconstruction following mastectomy: Secondary | ICD-10-CM | POA: Diagnosis not present

## 2023-07-24 DIAGNOSIS — C50419 Malignant neoplasm of upper-outer quadrant of unspecified female breast: Secondary | ICD-10-CM | POA: Diagnosis not present

## 2023-07-24 DIAGNOSIS — Z923 Personal history of irradiation: Secondary | ICD-10-CM | POA: Diagnosis present

## 2023-07-24 DIAGNOSIS — Z803 Family history of malignant neoplasm of breast: Secondary | ICD-10-CM | POA: Diagnosis not present

## 2023-07-24 DIAGNOSIS — I10 Essential (primary) hypertension: Secondary | ICD-10-CM | POA: Insufficient documentation

## 2023-07-24 HISTORY — PX: BREAST ENHANCEMENT SURGERY: SHX7

## 2023-07-24 HISTORY — PX: BREAST CAPSULECTOMY WITH IMPLANT EXCHANGE: SHX5592

## 2023-07-24 HISTORY — PX: LIPOSUCTION: SHX10

## 2023-07-24 SURGERY — AUGMENTATION, BREAST
Anesthesia: General | Site: Breast | Laterality: Right

## 2023-07-24 MED ORDER — ONDANSETRON HCL 4 MG/2ML IJ SOLN
INTRAMUSCULAR | Status: AC
  Filled 2023-07-24: qty 2

## 2023-07-24 MED ORDER — GABAPENTIN 300 MG PO CAPS
ORAL_CAPSULE | ORAL | Status: AC
Start: 2023-07-24 — End: 2023-07-24
  Filled 2023-07-24: qty 1

## 2023-07-24 MED ORDER — ATROPINE SULFATE 0.4 MG/ML IV SOLN
INTRAVENOUS | Status: AC
  Filled 2023-07-24: qty 1

## 2023-07-24 MED ORDER — SODIUM CHLORIDE 0.9 % IV SOLN
INTRAVENOUS | Status: DC | PRN
  Administered 2023-07-24: 500 mL

## 2023-07-24 MED ORDER — OXYCODONE HCL 5 MG/5ML PO SOLN: 5.0000 mg | Freq: Once | ORAL | Status: AC | PRN

## 2023-07-24 MED ORDER — ONDANSETRON HCL 4 MG/2ML IJ SOLN
INTRAMUSCULAR | Status: DC | PRN
  Administered 2023-07-24: 4 mg via INTRAVENOUS

## 2023-07-24 MED ORDER — CEFAZOLIN SODIUM-DEXTROSE 2-4 GM/100ML-% IV SOLN
INTRAVENOUS | Status: AC
  Filled 2023-07-24: qty 100

## 2023-07-24 MED ORDER — OXYCODONE HCL 5 MG PO TABS
5.0000 mg | ORAL_TABLET | Freq: Once | ORAL | Status: AC | PRN
  Administered 2023-07-24: 5 mg via ORAL

## 2023-07-24 MED ORDER — DEXAMETHASONE SODIUM PHOSPHATE 4 MG/ML IJ SOLN
INTRAMUSCULAR | Status: DC | PRN
Start: 2023-07-24 — End: 2023-07-24
  Administered 2023-07-24: 5 mg via INTRAVENOUS

## 2023-07-24 MED ORDER — ACETAMINOPHEN 500 MG PO TABS
1000.0000 mg | ORAL_TABLET | ORAL | Status: AC
  Administered 2023-07-24: 1000 mg via ORAL

## 2023-07-24 MED ORDER — DEXAMETHASONE SODIUM PHOSPHATE 10 MG/ML IJ SOLN
INTRAMUSCULAR | Status: AC
  Filled 2023-07-24: qty 1

## 2023-07-24 MED ORDER — SODIUM BICARBONATE 4.2 % IV SOLN
INTRAVENOUS | Status: AC
  Filled 2023-07-24: qty 20

## 2023-07-24 MED ORDER — PROPOFOL 10 MG/ML IV BOLUS
INTRAVENOUS | Status: DC | PRN
  Administered 2023-07-24: 200 mg via INTRAVENOUS

## 2023-07-24 MED ORDER — GABAPENTIN 300 MG PO CAPS
300.0000 mg | ORAL_CAPSULE | ORAL | Status: AC
  Administered 2023-07-24: 300 mg via ORAL

## 2023-07-24 MED ORDER — MIDAZOLAM HCL 5 MG/5ML IJ SOLN
INTRAMUSCULAR | Status: DC | PRN
  Administered 2023-07-24: 2 mg via INTRAVENOUS

## 2023-07-24 MED ORDER — BUPIVACAINE HCL (PF) 0.5 % IJ SOLN
INTRAMUSCULAR | Status: DC | PRN
  Administered 2023-07-24: 30 mL

## 2023-07-24 MED ORDER — LIDOCAINE 2% (20 MG/ML) 5 ML SYRINGE
INTRAMUSCULAR | Status: AC
  Filled 2023-07-24: qty 5

## 2023-07-24 MED ORDER — TRANEXAMIC ACID 1000 MG/10ML IV SOLN
INTRAVENOUS | Status: DC | PRN
  Administered 2023-07-24: 3000 mg via TOPICAL

## 2023-07-24 MED ORDER — FENTANYL CITRATE (PF) 100 MCG/2ML IJ SOLN
INTRAMUSCULAR | Status: AC
  Filled 2023-07-24: qty 2

## 2023-07-24 MED ORDER — PHENYLEPHRINE 80 MCG/ML (10ML) SYRINGE FOR IV PUSH (FOR BLOOD PRESSURE SUPPORT)
PREFILLED_SYRINGE | INTRAVENOUS | Status: AC
Start: 2023-07-24 — End: 2023-07-24
  Filled 2023-07-24: qty 10

## 2023-07-24 MED ORDER — FENTANYL CITRATE (PF) 100 MCG/2ML IJ SOLN
INTRAMUSCULAR | Status: AC
Start: 2023-07-24 — End: 2023-07-24
  Filled 2023-07-24: qty 2

## 2023-07-24 MED ORDER — CEFAZOLIN SODIUM-DEXTROSE 2-4 GM/100ML-% IV SOLN
2.0000 g | INTRAVENOUS | Status: AC
  Administered 2023-07-24: 2 g via INTRAVENOUS

## 2023-07-24 MED ORDER — LIDOCAINE 2% (20 MG/ML) 5 ML SYRINGE
INTRAMUSCULAR | Status: DC | PRN
  Administered 2023-07-24: 100 mg via INTRAVENOUS

## 2023-07-24 MED ORDER — SUCCINYLCHOLINE CHLORIDE 200 MG/10ML IV SOSY
PREFILLED_SYRINGE | INTRAVENOUS | Status: AC
  Filled 2023-07-24: qty 10

## 2023-07-24 MED ORDER — EPINEPHRINE PF 1 MG/ML IJ SOLN
INTRAMUSCULAR | Status: AC
Start: 2023-07-24 — End: 2023-07-24
  Filled 2023-07-24: qty 4

## 2023-07-24 MED ORDER — FENTANYL CITRATE (PF) 100 MCG/2ML IJ SOLN
INTRAMUSCULAR | Status: DC | PRN
  Administered 2023-07-24 (×2): 50 ug via INTRAVENOUS

## 2023-07-24 MED ORDER — FENTANYL CITRATE (PF) 100 MCG/2ML IJ SOLN
25.0000 ug | INTRAMUSCULAR | Status: DC | PRN
  Administered 2023-07-24: 25 ug via INTRAVENOUS
  Administered 2023-07-24 (×2): 50 ug via INTRAVENOUS
  Administered 2023-07-24: 25 ug via INTRAVENOUS

## 2023-07-24 MED ORDER — LACTATED RINGERS IV SOLN: INTRAVENOUS | Status: DC

## 2023-07-24 MED ORDER — MIDAZOLAM HCL 2 MG/2ML IJ SOLN
INTRAMUSCULAR | Status: AC
  Filled 2023-07-24: qty 2

## 2023-07-24 MED ORDER — ACETAMINOPHEN 500 MG PO TABS
ORAL_TABLET | ORAL | Status: AC
  Filled 2023-07-24: qty 2

## 2023-07-24 MED ORDER — OXYCODONE HCL 5 MG PO TABS
ORAL_TABLET | ORAL | Status: AC
  Filled 2023-07-24: qty 1

## 2023-07-24 MED ORDER — LIDOCAINE HCL (PF) 1 % IJ SOLN
INTRAMUSCULAR | Status: AC
  Filled 2023-07-24: qty 60

## 2023-07-24 MED ORDER — TRANEXAMIC ACID-NACL 1000-0.7 MG/100ML-% IV SOLN
INTRAVENOUS | Status: AC
  Filled 2023-07-24: qty 100

## 2023-07-24 MED ORDER — EPHEDRINE 5 MG/ML INJ
INTRAVENOUS | Status: AC
  Filled 2023-07-24: qty 5

## 2023-07-24 MED ORDER — SODIUM BICARBONATE 4.2 % IV SOLN
INTRAVENOUS | Status: DC | PRN
  Administered 2023-07-24: 400 mL

## 2023-07-24 MED ORDER — GENTAMICIN SULFATE 40 MG/ML IJ SOLN
INTRAMUSCULAR | Status: AC
  Filled 2023-07-24: qty 10

## 2023-07-24 MED ORDER — ACETAMINOPHEN 10 MG/ML IV SOLN: 1000.0000 mg | Freq: Once | INTRAVENOUS | Status: DC | PRN

## 2023-07-24 MED ORDER — TRANEXAMIC ACID-NACL 1000-0.7 MG/100ML-% IV SOLN
INTRAVENOUS | Status: AC
  Filled 2023-07-24: qty 200

## 2023-07-24 MED ORDER — DROPERIDOL 2.5 MG/ML IJ SOLN: 0.6250 mg | Freq: Once | INTRAMUSCULAR | Status: DC | PRN

## 2023-07-24 SURGICAL SUPPLY — 82 items
BAG DECANTER FOR FLEXI CONT (MISCELLANEOUS) ×3 IMPLANT
BINDER ABDOMINAL 10 UNV 27-48 (MISCELLANEOUS) IMPLANT
BINDER ABDOMINAL 12 SM 30-45 (SOFTGOODS) IMPLANT
BINDER BREAST 3XL (GAUZE/BANDAGES/DRESSINGS) IMPLANT
BINDER BREAST LRG (GAUZE/BANDAGES/DRESSINGS) IMPLANT
BINDER BREAST MEDIUM (GAUZE/BANDAGES/DRESSINGS) IMPLANT
BINDER BREAST XLRG (GAUZE/BANDAGES/DRESSINGS) IMPLANT
BINDER BREAST XXLRG (GAUZE/BANDAGES/DRESSINGS) IMPLANT
BLADE HEX COATED 2.75 (ELECTRODE) IMPLANT
BLADE SURG 10 STRL SS (BLADE) ×3 IMPLANT
BLADE SURG 11 STRL SS (BLADE) ×3 IMPLANT
BLADE SURG 15 STRL LF DISP TIS (BLADE) ×3 IMPLANT
BNDG ELASTIC 6INX 5YD STR LF (GAUZE/BANDAGES/DRESSINGS) IMPLANT
BNDG GAUZE DERMACEA FLUFF 4 (GAUZE/BANDAGES/DRESSINGS) ×6 IMPLANT
CANISTER SUCT 1200ML W/VALVE (MISCELLANEOUS) ×3 IMPLANT
CHLORAPREP W/TINT 26 (MISCELLANEOUS) ×3 IMPLANT
COVER BACK TABLE 60X90IN (DRAPES) ×3 IMPLANT
COVER MAYO STAND STRL (DRAPES) ×3 IMPLANT
DERMABOND ADVANCED .7 DNX12 (GAUZE/BANDAGES/DRESSINGS) ×6 IMPLANT
DRAIN CHANNEL 15F RND FF W/TCR (WOUND CARE) ×3 IMPLANT
DRAPE INCISE IOBAN 66X45 STRL (DRAPES) IMPLANT
DRAPE LAPAROSCOPIC ABDOMINAL (DRAPES) IMPLANT
DRAPE TOP ARMCOVERS (MISCELLANEOUS) ×3 IMPLANT
DRAPE U-SHAPE 76X120 STRL (DRAPES) ×3 IMPLANT
DRAPE UTILITY XL STRL (DRAPES) ×3 IMPLANT
DRSG TEGADERM 2-3/8X2-3/4 SM (GAUZE/BANDAGES/DRESSINGS) IMPLANT
ELECT BLADE 6.5 EXT (BLADE) IMPLANT
ELECT COATED BLADE 2.86 ST (ELECTRODE) ×3 IMPLANT
ELECTRODE BLDE 4.0 EZ CLN MEGD (MISCELLANEOUS) ×3 IMPLANT
ELECTRODE REM PT RTRN 9FT ADLT (ELECTROSURGICAL) ×3 IMPLANT
EVACUATOR SILICONE 100CC (DRAIN) ×3 IMPLANT
GAUZE PAD ABD 8X10 STRL (GAUZE/BANDAGES/DRESSINGS) ×6 IMPLANT
GLOVE BIO SURGEON STRL SZ 6 (GLOVE) ×6 IMPLANT
GLOVE BIO SURGEON STRL SZ 6.5 (GLOVE) IMPLANT
GLOVE BIOGEL PI IND STRL 6.5 (GLOVE) IMPLANT
GOWN STRL REUS W/ TWL LRG LVL3 (GOWN DISPOSABLE) ×6 IMPLANT
IMPL BREAST 350-370 (Breast) IMPLANT
IV NS 1000ML BAXH (IV SOLUTION) IMPLANT
IV NS 500ML BAXH (IV SOLUTION) ×3 IMPLANT
KIT FILL ASEPTIC TRANSFER (MISCELLANEOUS) IMPLANT
LINER CANISTER 1000CC FLEX (MISCELLANEOUS) ×3 IMPLANT
MARKER SKIN DUAL TIP RULER LAB (MISCELLANEOUS) IMPLANT
NDL FILTER BLUNT 18X1 1/2 (NEEDLE) IMPLANT
NDL HYPO 25X1 1.5 SAFETY (NEEDLE) ×3 IMPLANT
NDL SAFETY ECLIPSE 18X1.5 (NEEDLE) ×3 IMPLANT
NDL SPNL 18GX3.5 QUINCKE PK (NEEDLE) IMPLANT
NEEDLE FILTER BLUNT 18X1 1/2 (NEEDLE) IMPLANT
NEEDLE HYPO 25X1 1.5 SAFETY (NEEDLE) ×3 IMPLANT
NEEDLE SPNL 18GX3.5 QUINCKE PK (NEEDLE) IMPLANT
NS IRRIG 1000ML POUR BTL (IV SOLUTION) ×3 IMPLANT
PACK BASIN DAY SURGERY FS (CUSTOM PROCEDURE TRAY) ×3 IMPLANT
PAD ALCOHOL SWAB (MISCELLANEOUS) ×6 IMPLANT
PENCIL SMOKE EVACUATOR (MISCELLANEOUS) ×3 IMPLANT
PIN SAFETY STERILE (MISCELLANEOUS) ×3 IMPLANT
SHEET MEDIUM DRAPE 40X70 STRL (DRAPES) ×6 IMPLANT
SIZER BRST 350-370 SNGL USE (SIZER) IMPLANT
SIZER BRST SGL USE 330-360CC (SIZER) IMPLANT
SLEEVE SCD COMPRESS KNEE MED (STOCKING) ×3 IMPLANT
SPIKE FLUID TRANSFER (MISCELLANEOUS) ×3 IMPLANT
SPONGE T-LAP 18X18 ~~LOC~~+RFID (SPONGE) ×6 IMPLANT
STAPLER SKIN PROX WIDE 3.9 (STAPLE) ×3 IMPLANT
STRIP CLOSURE SKIN 1/2X4 (GAUZE/BANDAGES/DRESSINGS) ×3 IMPLANT
SUT ETHILON 2 0 FS 18 (SUTURE) IMPLANT
SUT MNCRL AB 4-0 PS2 18 (SUTURE) ×3 IMPLANT
SUT PDS AB 2-0 CT2 27 (SUTURE) IMPLANT
SUT PDS II 3-0 CT2 27 ABS (SUTURE) IMPLANT
SUT VIC AB 3-0 PS1 18XBRD (SUTURE) IMPLANT
SUT VIC AB 3-0 SH 27X BRD (SUTURE) ×3 IMPLANT
SUT VIC AB 4-0 PS2 18 (SUTURE) ×3 IMPLANT
SUT VICRYL 0 CT-2 (SUTURE) IMPLANT
SUTURE STRATFX 0 PDS 27 VIOLET (SUTURE) IMPLANT
SYR 20ML LL LF (SYRINGE) IMPLANT
SYR 50ML LL SCALE MARK (SYRINGE) ×3 IMPLANT
SYR BULB IRRIG 60ML STRL (SYRINGE) ×3 IMPLANT
SYR CONTROL 10ML LL (SYRINGE) ×3 IMPLANT
SYR TB 1ML LL NO SAFETY (SYRINGE) ×3 IMPLANT
TOWEL GREEN STERILE FF (TOWEL DISPOSABLE) ×6 IMPLANT
TUBE CONNECTING 20X1/4 (TUBING) ×3 IMPLANT
TUBING INFILTRATION IT-10001 (TUBING) ×3 IMPLANT
TUBING SET GRADUATE ASPIR 12FT (MISCELLANEOUS) ×3 IMPLANT
UNDERPAD 30X36 HEAVY ABSORB (UNDERPADS AND DIAPERS) ×6 IMPLANT
YANKAUER SUCT BULB TIP NO VENT (SUCTIONS) ×3 IMPLANT

## 2023-07-24 NOTE — Op Note (Signed)
 Operative Note   DATE OF OPERATION: 6.24.2025  LOCATION: [facility]-outpatient  SURGICAL DIVISION: Plastic Surgery  PREOPERATIVE DIAGNOSES:  1. History breast cancer 2. History therapeutic radiation  POSTOPERATIVE DIAGNOSES:  same  PROCEDURE:  1. Bilateral breast augmentation with saline implants 2. Liposuction bilateral lateral chest wall  SURGEON: Earlis Ranks MD MBA  ASSISTANT: none  ANESTHESIA:  General.   EBL: 35 ml  COMPLICATIONS: None immediate.   INDICATIONS FOR PROCEDURE:  The patient, Christie Fox, is a 68 y.o. female born on 1955/02/10, is here for staged breast reconstruction following right lumpectomy and adjuvant radiation. She has undergone prior mastopexy.   FINDINGS: Natrelle Smooth Round Low Profile 350 ml implant placed bilateral in subglandular position. RIGHT fill volume 350 ml [id number] LEFT fill volume 370 ml [id number]. Lipoaspirate right lateral chest 175 ml Lipoaspirate left lateral chest 175 ml.   DESCRIPTION OF PROCEDURE:  The patient's operative site was marked with the patient in the preoperative area including bilateral lateral chest wall for liposcution. The patient was taken to the operating room. SCDs were placed and IV antibiotics were given. The patient's operative site was prepped and draped in a sterile fashion. A time out was performed and all information was confirmed to be correct. Incision made in left inframammary fold scar and carried to pectoralis muscle surface. Dissection completed above muscle to anticipated dimensions implant. Sizer placed. I then directed attention to right breast.  Incision made in inframammary fold scar and carried to pectoralis muscle surface. Dissection completed above muscle to anticipated dimensions implant. Sizer placed. Patient brought to upright sitting position. A low profile 350 ml implant selected for bilateral placement. Patient returned to supine position.  Stab incision made in bilateral  upper abdomen scars and tumescent infiltrated over lateral chest walls total 400 ml. Power assisted liposuction completed bilateral lipoaspirate total 350 ml.   Within left breast a TXA soaked sponge placed in cavity. Over right chest cavity irrigated with saline solution containing Ancef , gentamicin , and Betadine. Hemostasis ensured. Local anesthetic infiltrated. Implant prepared and placed in cavity. Implant filled to 350 ml. Fill tubing removed. Tab closure and implant orientation ensured. Closure completed with 3-0 vicryl in superficial fascia, 4-0 vicryl in dermis, 4-0 monocryl subcuticular skin closure. Over left chest, sponge removed  and cavity irrigated with saline solution containing Ancef , gentamicin , and Betadine. Hemostasis ensured. Local anesthetic infiltrated. Implant prepared and placed in cavity. Implant filled to 370 ml. Fill tubing removed. Tab closure and implant orientation ensured. Closure completed with 3-0 vicryl in superficial fascia, 4-0 vicryl in dermis, 4-0 monocryl subcuticular skin closure. Liposuction port sites approximated with interrupted 4-0 monocryl sutures. Dermabond applied to inframammary fold incisions. Dry dressing and breast binder applied.   The patient was allowed to wake from anesthesia, extubated and taken to the recovery room in satisfactory condition.   SPECIMENS: none  DRAINS: none  Earlis Ranks, MD [facility] Plastic & Reconstructive Surgery  Office/ physician access line after hours [phone]

## 2023-07-24 NOTE — Anesthesia Procedure Notes (Signed)
 Procedure Name: LMA Insertion Date/Time: 07/24/2023 8:45 AM  Performed by: Emilio Rock BIRCH, CRNAPre-anesthesia Checklist: Patient identified, Emergency Drugs available, Suction available and Patient being monitored Patient Re-evaluated:Patient Re-evaluated prior to induction Oxygen Delivery Method: Circle system utilized Preoxygenation: Pre-oxygenation with 100% oxygen Induction Type: IV induction Ventilation: Mask ventilation without difficulty LMA: LMA inserted LMA Size: 4.0 Number of attempts: 1 Airway Equipment and Method: Bite block Placement Confirmation: positive ETCO2 Tube secured with: Tape Dental Injury: Teeth and Oropharynx as per pre-operative assessment

## 2023-07-24 NOTE — Transfer of Care (Signed)
 Immediate Anesthesia Transfer of Care Note  Patient: Christie Fox  Procedure(s) Performed: AUGMENTATION, BREAST (Left: Breast) CAPSULECTOMY, BREAST, WITH REPLACEMENT OF IMPLANT (Right: Breast) LIPOSUCTION (Bilateral)  Patient Location: PACU  Anesthesia Type:General  Level of Consciousness: awake, alert , and oriented  Airway & Oxygen Therapy: Patient Spontanous Breathing and Patient connected to face mask oxygen  Post-op Assessment: Report given to RN and Post -op Vital signs reviewed and stable  Post vital signs: Reviewed and stable  Last Vitals:  Vitals Value Taken Time  BP    Temp    Pulse 80 07/24/23 11:19  Resp 11 07/24/23 11:19  SpO2 98 % 07/24/23 11:19  Vitals shown include unfiled device data.  Last Pain:  Vitals:   07/24/23 0743  TempSrc: Temporal  PainSc: 0-No pain         Complications: No notable events documented.

## 2023-07-24 NOTE — Anesthesia Postprocedure Evaluation (Signed)
 Anesthesia Post Note  Patient: NILLIE BARTOLOTTA  Procedure(s) Performed: AUGMENTATION, BREAST (Left: Breast) CAPSULECTOMY, BREAST, WITH REPLACEMENT OF IMPLANT (Right: Breast) LIPOSUCTION (Bilateral)     Patient location during evaluation: PACU Anesthesia Type: General Level of consciousness: awake and alert Pain management: pain level controlled Vital Signs Assessment: post-procedure vital signs reviewed and stable Respiratory status: spontaneous breathing, nonlabored ventilation, respiratory function stable and patient connected to nasal cannula oxygen Cardiovascular status: blood pressure returned to baseline and stable Postop Assessment: no apparent nausea or vomiting Anesthetic complications: no   No notable events documented.  Last Vitals:  Vitals:   07/24/23 1134 07/24/23 1145  BP:  (!) 127/95  Pulse: 75 72  Resp: 17 11  Temp:    SpO2: 97% 96%    Last Pain:  Vitals:   07/24/23 1145  TempSrc:   PainSc: Asleep                 Thom JONELLE Peoples

## 2023-07-24 NOTE — Anesthesia Preprocedure Evaluation (Signed)
 Anesthesia Evaluation  Patient identified by MRN, date of birth, ID band Patient awake    Reviewed: Allergy & Precautions, H&P , NPO status , Patient's Chart, lab work & pertinent test results  History of Anesthesia Complications (+) PONV and history of anesthetic complications  Airway Mallampati: II  TM Distance: >3 FB Neck ROM: Full    Dental no notable dental hx.    Pulmonary neg pulmonary ROS   Pulmonary exam normal breath sounds clear to auscultation       Cardiovascular hypertension, (-) Past MI Normal cardiovascular exam Rhythm:Regular Rate:Normal     Neuro/Psych  Headaches PSYCHIATRIC DISORDERS Anxiety        GI/Hepatic negative GI ROS, Neg liver ROS,,,  Endo/Other  Hypothyroidism    Renal/GU negative Renal ROS  negative genitourinary   Musculoskeletal  (+) Arthritis ,    Abdominal   Peds negative pediatric ROS (+)  Hematology negative hematology ROS (+)   Anesthesia Other Findings   Reproductive/Obstetrics negative OB ROS Breast cancer                             Anesthesia Physical Anesthesia Plan  ASA: 3  Anesthesia Plan: General   Post-op Pain Management: Tylenol  PO (pre-op)*   Induction: Intravenous  PONV Risk Score and Plan: 4 or greater and Ondansetron , Dexamethasone , Treatment may vary due to age or medical condition and Midazolam   Airway Management Planned: Oral ETT  Additional Equipment: None  Intra-op Plan:   Post-operative Plan: Extubation in OR  Informed Consent: I have reviewed the patients History and Physical, chart, labs and discussed the procedure including the risks, benefits and alternatives for the proposed anesthesia with the patient or authorized representative who has indicated his/her understanding and acceptance.     Dental advisory given  Plan Discussed with: CRNA  Anesthesia Plan Comments:        Anesthesia Quick  Evaluation

## 2023-07-24 NOTE — Discharge Instructions (Signed)
 No tylenol  before 1:45p.  Post Anesthesia Home Care Instructions  Activity: Get plenty of rest for the remainder of the day. A responsible individual must stay with you for 24 hours following the procedure.  For the next 24 hours, DO NOT: -Drive a car -Advertising copywriter -Drink alcoholic beverages -Take any medication unless instructed by your physician -Make any legal decisions or sign important papers.  Meals: Start with liquid foods such as gelatin or soup. Progress to regular foods as tolerated. Avoid greasy, spicy, heavy foods. If nausea and/or vomiting occur, drink only clear liquids until the nausea and/or vomiting subsides. Call your physician if vomiting continues.  Special Instructions/Symptoms: Your throat may feel dry or sore from the anesthesia or the breathing tube placed in your throat during surgery. If this causes discomfort, gargle with warm salt water. The discomfort should disappear within 24 hours.  If you had a scopolamine  patch placed behind your ear for the management of post- operative nausea and/or vomiting:  1. The medication in the patch is effective for 72 hours, after which it should be removed.  Wrap patch in a tissue and discard in the trash. Wash hands thoroughly with soap and water. 2. You may remove the patch earlier than 72 hours if you experience unpleasant side effects which may include dry mouth, dizziness or visual disturbances. 3. Avoid touching the patch. Wash your hands with soap and water after contact with the patch.

## 2023-07-24 NOTE — Interval H&P Note (Signed)
 History and Physical Interval Note:  07/24/2023 7:13 AM  Christie Fox  has presented today for surgery, with the diagnosis of Z85.3, Z92.3.  The various methods of treatment have been discussed with the patient and family. After consideration of risks, benefits and other options for treatment, the patient has consented to  bilateral breast augmentation with saline implants liposuction bilateral lateral chest wall as a surgical intervention.  The patient's history has been reviewed, patient examined, no change in status, stable for surgery.  I have reviewed the patient's chart and labs.  Questions were answered to the patient's satisfaction.     Christie Fox

## 2023-07-25 ENCOUNTER — Encounter (HOSPITAL_BASED_OUTPATIENT_CLINIC_OR_DEPARTMENT_OTHER): Payer: Self-pay | Admitting: Plastic Surgery

## 2023-09-27 ENCOUNTER — Other Ambulatory Visit: Payer: Self-pay | Admitting: Pain Medicine

## 2023-09-27 DIAGNOSIS — M5412 Radiculopathy, cervical region: Secondary | ICD-10-CM

## 2023-09-27 DIAGNOSIS — G894 Chronic pain syndrome: Secondary | ICD-10-CM

## 2023-10-21 ENCOUNTER — Ambulatory Visit
Admission: RE | Admit: 2023-10-21 | Discharge: 2023-10-21 | Disposition: A | Discharge status: Home / Self Care | Source: Ambulatory Visit | Attending: Pain Medicine | Admitting: Pain Medicine

## 2023-10-21 DIAGNOSIS — G894 Chronic pain syndrome: Secondary | ICD-10-CM

## 2023-10-21 DIAGNOSIS — M5412 Radiculopathy, cervical region: Secondary | ICD-10-CM

## 2023-11-20 ENCOUNTER — Ambulatory Visit (HOSPITAL_COMMUNITY): Payer: Self-pay | Admitting: Physician Assistant

## 2023-11-23 ENCOUNTER — Encounter (HOSPITAL_COMMUNITY): Payer: Self-pay

## 2023-11-23 ENCOUNTER — Ambulatory Visit (HOSPITAL_COMMUNITY): Payer: Self-pay | Admitting: Physician Assistant

## 2023-11-23 NOTE — Progress Notes (Signed)
 PCP - Monica Aho, PA (Medical Clearance 11/14/23- requested, not seen in EPIC.  Called [place], [place], [facility] requesting medical clearance.  Spoke with Mylinda at MD's office who is going to fax over medical clearance. Cardiologist - none   Chest x-ray - n/a EKG - 07/19/23 Stress Test - 10/12/99 ECHO - n/a Cardiac Cath - n/a  ICD Pacemaker/Loop - n/a  Sleep Study -  n/a  Diabetes - n/a  Aspirin & Blood Thinner Instructions:  n/a  ERAS -clear liquids til 0430 DOS.  Anesthesia review: Yes  STOP now taking any Aspirin (unless otherwise instructed by your surgeon), Aleve, Naproxen, Ibuprofen, Motrin, Advil, Goody's, BC's, all herbal medications, fish oil, and all vitamins.   Coronavirus Screening Do you have any of the following symptoms:  Cough yes/no: No Fever (>100.17F)  yes/no: No Runny nose yes/no: No Sore throat yes/no: No Difficulty breathing/shortness of breath  yes/no: No  Have you traveled in the last 14 days and where? yes/no: No  Patient verbalized understanding of instructions that were given to them at the PAT appointment. Patient was also instructed that they will need to review over the PAT instructions again at home before surgery.

## 2023-11-23 NOTE — Progress Notes (Signed)
 Surgical Instructions   Your procedure is scheduled on Thursday November 29, 2023. Report to [facility] Main Entrance A at 5:30 A.M., then check in with the Admitting office. Any questions or running late day of surgery: call [phone]  Questions prior to your surgery date: call [phone], Monday-Friday, 8am-4pm. If you experience any cold or flu symptoms such as cough, fever, chills, shortness of breath, etc. between now and your scheduled surgery, please notify us  at the above number.     Remember:  Do not eat after midnight the night before your surgery  You may drink clear liquids until 4:30 the morning of your surgery.   Clear liquids allowed are: Water, Non-Citrus Juices (without pulp), Carbonated Beverages, Clear Tea (no milk, honey, etc.), Black Coffee Only (NO MILK, CREAM OR POWDERED CREAMER of any kind), and Gatorade.    Take these medicines the morning of surgery with A SIP OF WATER  ALPRAZolam (XANAX)  levothyroxine (SYNTHROID, LEVOTHROID)  pregabalin (LYRICA)  rosuvastatin (CRESTOR)   One week prior to surgery, STOP taking any Aspirin (unless otherwise instructed by your surgeon) Aleve, Naproxen, Ibuprofen, Motrin, Advil, Goody's, BC's, all herbal medications, fish oil, and non-prescription vitamins.                     Do NOT Smoke (Tobacco/Vaping) for 24 hours prior to your procedure.  If you use a CPAP at night, you may bring your mask/headgear for your overnight stay.   You will be asked to remove any contacts, glasses, piercing's, hearing aid's, dentures/partials prior to surgery. Please bring cases for these items if needed.    Patients discharged the day of surgery will not be allowed to drive home, and someone needs to stay with them for 24 hours.  SURGICAL WAITING ROOM VISITATION Patients may have no more than 2 support people in the waiting area - these visitors may rotate.   Pre-op nurse will coordinate an appropriate time for 1 ADULT support  person, who may not rotate, to accompany patient in pre-op.  Children under the age of 23 must have an adult with them who is not the patient and must remain in the main waiting area with an adult.  If the patient needs to stay at the hospital during part of their recovery, the visitor guidelines for inpatient rooms apply.  Please refer to the [facility] website for the visitor guidelines for any additional information.   If you received a COVID test during your pre-op visit  it is requested that you wear a mask when out in public, stay away from anyone that may not be feeling well and notify your surgeon if you develop symptoms. If you have been in contact with anyone that has tested positive in the last 10 days please notify you surgeon.      Pre-operative 4 CHG Bathing Instructions   You can play a key role in reducing the risk of infection after surgery. Your skin needs to be as free of germs as possible. You can reduce the number of germs on your skin by washing with CHG (chlorhexidine  gluconate) soap before surgery. CHG is an antiseptic soap that kills germs and continues to kill germs even after washing.   DO NOT use if you have an allergy to chlorhexidine /CHG or antibacterial soaps. If your skin becomes reddened or irritated, stop using the CHG and notify one of our RNs at [phone].   Please shower with the CHG soap starting 4 days before surgery  using the following schedule:     Please keep in mind the following:  DO NOT shave, including legs and underarms, starting the day of your first shower.   Place clean sheets on your bed the day you start using CHG soap. Use a clean washcloth (not used since being washed) for each shower. DO NOT sleep with pets once you start using the CHG.   CHG Shower Instructions:  Wash your face and private area with normal soap. If you choose to wash your hair, wash first with your normal shampoo.  After you use shampoo/soap, rinse your hair  and body thoroughly to remove shampoo/soap residue.  Turn the water OFF and apply  bottle of CHG soap to a CLEAN washcloth.  Apply CHG soap ONLY FROM YOUR NECK DOWN TO YOUR TOES (washing for 3-5 minutes)  DO NOT use CHG soap on face, private areas, open wounds, or sores.  Pay special attention to the area where your surgery is being performed.  If you are having back surgery, having someone wash your back for you may be helpful. Wait 2 minutes after CHG soap is applied, then you may rinse off the CHG soap.  Pat dry with a clean towel  Put on clean clothes/pajamas   If you choose to wear lotion, please use ONLY the CHG-compatible lotions that are listed below.  Additional instructions for the day of surgery:  If you choose, you may shower the morning of surgery with an antibacterial soap.  DO NOT APPLY any lotions, deodorants or perfumes.   Do not bring valuables to the hospital. [facility] is not responsible for any belongings/valuables. Do not wear nail polish, gel polish, artificial nails, or any other type of covering on natural nails (fingers and toes) Do not wear jewelry or makeup Put on clean/comfortable clothes.  Please brush your teeth.  Ask your nurse before applying any prescription medications to the skin.     CHG Compatible Lotions   Aveeno Moisturizing lotion  Cetaphil Moisturizing Cream  Cetaphil Moisturizing Lotion  Clairol Herbal Essence Moisturizing Lotion, Dry Skin  Clairol Herbal Essence Moisturizing Lotion, Extra Dry Skin  Clairol Herbal Essence Moisturizing Lotion, Normal Skin  Curel Age Defying Therapeutic Moisturizing Lotion with Alpha Hydroxy  Curel Extreme Care Body Lotion  Curel Soothing Hands Moisturizing Hand Lotion  Curel Therapeutic Moisturizing Cream, Fragrance-Free  Curel Therapeutic Moisturizing Lotion, Fragrance-Free  Curel Therapeutic Moisturizing Lotion, Original Formula  Eucerin Daily Replenishing Lotion  Eucerin Dry Skin Therapy Plus  Alpha Hydroxy Crme  Eucerin Dry Skin Therapy Plus Alpha Hydroxy Lotion  Eucerin Original Crme  Eucerin Original Lotion  Eucerin Plus Crme Eucerin Plus Lotion  Eucerin TriLipid Replenishing Lotion  Keri Anti-Bacterial Hand Lotion  Keri Deep Conditioning Original Lotion Dry Skin Formula Softly Scented  Keri Deep Conditioning Original Lotion, Fragrance Free Sensitive Skin Formula  Keri Lotion Fast Absorbing Fragrance Free Sensitive Skin Formula  Keri Lotion Fast Absorbing Softly Scented Dry Skin Formula  Keri Original Lotion  Keri Skin Renewal Lotion Keri Silky Smooth Lotion  Keri Silky Smooth Sensitive Skin Lotion  Nivea Body Creamy Conditioning Oil  Nivea Body Extra Enriched Lotion  Nivea Body Original Lotion  Nivea Body Sheer Moisturizing Lotion Nivea Crme  Nivea Skin Firming Lotion  NutraDerm 30 Skin Lotion  NutraDerm Skin Lotion  NutraDerm Therapeutic Skin Cream  NutraDerm Therapeutic Skin Lotion  ProShield Protective Hand Cream  Provon moisturizing lotion  Please read over the following fact sheets that you were given.

## 2023-11-26 ENCOUNTER — Other Ambulatory Visit: Payer: Self-pay

## 2023-11-26 ENCOUNTER — Encounter (HOSPITAL_COMMUNITY)
Admission: RE | Admit: 2023-11-26 | Discharge: 2023-11-26 | Disposition: A | Discharge status: Home / Self Care | Source: Ambulatory Visit | Attending: Orthopedic Surgery | Admitting: Orthopedic Surgery

## 2023-11-26 ENCOUNTER — Encounter (HOSPITAL_COMMUNITY): Payer: Self-pay

## 2023-11-26 VITALS — BP 127/61 | HR 76 | Temp 97.7°F | Resp 16 | Ht 62.5 in | Wt 232.0 lb

## 2023-11-26 DIAGNOSIS — Z01818 Encounter for other preprocedural examination: Secondary | ICD-10-CM | POA: Insufficient documentation

## 2023-11-26 HISTORY — DX: Dependence on other enabling machines and devices: Z99.89

## 2023-11-26 HISTORY — DX: Prediabetes: R73.03

## 2023-11-26 LAB — TYPE AND SCREEN
ABO/RH(D): A POS
Antibody Screen: NEGATIVE

## 2023-11-26 LAB — CBC
HCT: 41.5 % (ref 36.0–46.0)
Hemoglobin: 13.3 g/dL (ref 12.0–15.0)
MCH: 30.6 pg (ref 26.0–34.0)
MCHC: 32 g/dL (ref 30.0–36.0)
MCV: 95.4 fL (ref 80.0–100.0)
Platelets: 306 K/uL (ref 150–400)
RBC: 4.35 MIL/uL (ref 3.87–5.11)
RDW: 12.7 % (ref 11.5–15.5)
WBC: 8.6 K/uL (ref 4.0–10.5)
nRBC: 0 % (ref 0.0–0.2)

## 2023-11-26 LAB — BASIC METABOLIC PANEL WITH GFR
Anion gap: 11 (ref 5–15)
BUN: 9 mg/dL (ref 8–23)
CO2: 26 mmol/L (ref 22–32)
Calcium: 9.4 mg/dL (ref 8.9–10.3)
Chloride: 104 mmol/L (ref 98–111)
Creatinine, Ser: 0.6 mg/dL (ref 0.44–1.00)
GFR, Estimated: >60 mL/min (ref >60)
Glucose, Bld: 102 mg/dL — ABNORMAL HIGH (ref 70–99)
Potassium: 4.5 mmol/L (ref 3.5–5.1)
Sodium: 141 mmol/L (ref 135–145)

## 2023-11-26 LAB — SURGICAL PCR SCREEN
MRSA, PCR: NEGATIVE
Staphylococcus aureus: POSITIVE — AB

## 2023-11-29 ENCOUNTER — Inpatient Hospital Stay (HOSPITAL_COMMUNITY)

## 2023-11-29 ENCOUNTER — Inpatient Hospital Stay (HOSPITAL_COMMUNITY): Admitting: Physician Assistant

## 2023-11-29 ENCOUNTER — Inpatient Hospital Stay (HOSPITAL_COMMUNITY)
Admission: RE | Admit: 2023-11-29 | Discharge: 2023-11-30 | DRG: 472 | Disposition: A | Discharge status: Home / Self Care | Attending: Orthopedic Surgery | Admitting: Orthopedic Surgery

## 2023-11-29 ENCOUNTER — Encounter (HOSPITAL_COMMUNITY)
Admission: RE | Disposition: A | Discharge status: Home / Self Care | Payer: Self-pay | Source: Home / Self Care | Attending: Orthopedic Surgery

## 2023-11-29 DIAGNOSIS — F419 Anxiety disorder, unspecified: Secondary | ICD-10-CM | POA: Diagnosis present

## 2023-11-29 DIAGNOSIS — I10 Essential (primary) hypertension: Secondary | ICD-10-CM | POA: Diagnosis present

## 2023-11-29 DIAGNOSIS — M4722 Other spondylosis with radiculopathy, cervical region: Secondary | ICD-10-CM | POA: Diagnosis present

## 2023-11-29 DIAGNOSIS — M4322 Fusion of spine, cervical region: Principal | ICD-10-CM | POA: Diagnosis present

## 2023-11-29 DIAGNOSIS — Z91013 Allergy to seafood: Secondary | ICD-10-CM

## 2023-11-29 DIAGNOSIS — E039 Hypothyroidism, unspecified: Secondary | ICD-10-CM | POA: Diagnosis present

## 2023-11-29 DIAGNOSIS — Z853 Personal history of malignant neoplasm of breast: Secondary | ICD-10-CM | POA: Diagnosis not present

## 2023-11-29 DIAGNOSIS — M4712 Other spondylosis with myelopathy, cervical region: Secondary | ICD-10-CM | POA: Diagnosis present

## 2023-11-29 DIAGNOSIS — E785 Hyperlipidemia, unspecified: Secondary | ICD-10-CM | POA: Diagnosis present

## 2023-11-29 DIAGNOSIS — Z79899 Other long term (current) drug therapy: Secondary | ICD-10-CM

## 2023-11-29 DIAGNOSIS — Z79890 Hormone replacement therapy (postmenopausal): Secondary | ICD-10-CM | POA: Diagnosis not present

## 2023-11-29 DIAGNOSIS — M542 Cervicalgia: Secondary | ICD-10-CM | POA: Diagnosis present

## 2023-11-29 DIAGNOSIS — M4802 Spinal stenosis, cervical region: Secondary | ICD-10-CM | POA: Diagnosis present

## 2023-11-29 HISTORY — PX: ANTERIOR CERVICAL DECOMP/DISCECTOMY FUSION: SHX1161

## 2023-11-29 SURGERY — ANTERIOR CERVICAL DECOMPRESSION/DISCECTOMY FUSION 3 LEVELS
Anesthesia: General | Site: Spine Cervical

## 2023-11-29 MED ORDER — SODIUM CHLORIDE 0.9 % IV SOLN: 250.0000 mL | INTRAVENOUS | Status: AC

## 2023-11-29 MED ORDER — CEFAZOLIN SODIUM-DEXTROSE 2-4 GM/100ML-% IV SOLN
2.0000 g | INTRAVENOUS | Status: AC
  Administered 2023-11-29: 2 g via INTRAVENOUS
  Filled 2023-11-29: qty 100

## 2023-11-29 MED ORDER — KETAMINE HCL 10 MG/ML IJ SOLN
INTRAMUSCULAR | Status: DC | PRN
  Administered 2023-11-29: 10 mg via INTRAVENOUS
  Administered 2023-11-29: 20 mg via INTRAVENOUS

## 2023-11-29 MED ORDER — PHENOL 1.4 % MT LIQD: 1.0000 | OROMUCOSAL | Status: DC | PRN

## 2023-11-29 MED ORDER — FENTANYL CITRATE (PF) 100 MCG/2ML IJ SOLN
INTRAMUSCULAR | Status: AC
  Filled 2023-11-29: qty 2

## 2023-11-29 MED ORDER — ONDANSETRON HCL 4 MG/2ML IJ SOLN
INTRAMUSCULAR | Status: DC | PRN
Start: 2023-11-29 — End: 2023-11-29
  Administered 2023-11-29: 4 mg via INTRAVENOUS

## 2023-11-29 MED ORDER — PROPOFOL 10 MG/ML IV BOLUS
INTRAVENOUS | Status: AC
Start: 2023-11-29 — End: 2023-11-29
  Filled 2023-11-29: qty 20

## 2023-11-29 MED ORDER — KETAMINE HCL 50 MG/5ML IJ SOSY
PREFILLED_SYRINGE | INTRAMUSCULAR | Status: AC
  Filled 2023-11-29: qty 5

## 2023-11-29 MED ORDER — FUROSEMIDE 40 MG PO TABS
40.0000 mg | ORAL_TABLET | Freq: Two times a day (BID) | ORAL | Status: DC
  Administered 2023-11-30: 40 mg via ORAL
  Filled 2023-11-29: qty 1

## 2023-11-29 MED ORDER — SUCCINYLCHOLINE CHLORIDE 200 MG/10ML IV SOSY
PREFILLED_SYRINGE | INTRAVENOUS | Status: AC
  Filled 2023-11-29: qty 10

## 2023-11-29 MED ORDER — PHENYLEPHRINE HCL-NACL 20-0.9 MG/250ML-% IV SOLN
INTRAVENOUS | Status: DC | PRN
  Administered 2023-11-29: 30 ug/min via INTRAVENOUS

## 2023-11-29 MED ORDER — OXYCODONE HCL 5 MG/5ML PO SOLN: 5.0000 mg | Freq: Once | ORAL | Status: DC | PRN

## 2023-11-29 MED ORDER — MAGNESIUM CITRATE PO SOLN: 1.0000 | Freq: Once | ORAL | Status: DC | PRN

## 2023-11-29 MED ORDER — ROSUVASTATIN CALCIUM 20 MG PO TABS
20.0000 mg | ORAL_TABLET | Freq: Every day | ORAL | Status: DC
Start: 2023-11-30 — End: 2023-11-30
  Administered 2023-11-30: 20 mg via ORAL
  Filled 2023-11-29: qty 1

## 2023-11-29 MED ORDER — BUPIVACAINE-EPINEPHRINE 0.25% -1:200000 IJ SOLN
INTRAMUSCULAR | Status: DC | PRN
  Administered 2023-11-29: 9 mL

## 2023-11-29 MED ORDER — CEFAZOLIN SODIUM-DEXTROSE 1-4 GM/50ML-% IV SOLN
1.0000 g | Freq: Three times a day (TID) | INTRAVENOUS | Status: AC
  Administered 2023-11-29 (×2): 1 g via INTRAVENOUS
  Filled 2023-11-29 (×2): qty 50

## 2023-11-29 MED ORDER — SUCCINYLCHOLINE CHLORIDE 200 MG/10ML IV SOSY
PREFILLED_SYRINGE | INTRAVENOUS | Status: DC | PRN
  Administered 2023-11-29: 120 mg via INTRAVENOUS

## 2023-11-29 MED ORDER — PREGABALIN 75 MG PO CAPS
150.0000 mg | ORAL_CAPSULE | Freq: Two times a day (BID) | ORAL | Status: DC
Start: 2023-11-29 — End: 2023-11-29

## 2023-11-29 MED ORDER — SURGIFLO WITH THROMBIN (HEMOSTATIC MATRIX KIT) OPTIME
TOPICAL | Status: DC | PRN
  Administered 2023-11-29 (×2): 1 via TOPICAL

## 2023-11-29 MED ORDER — THROMBIN 20000 UNITS EX SOLR
CUTANEOUS | Status: DC | PRN
  Administered 2023-11-29: 20 mL via TOPICAL

## 2023-11-29 MED ORDER — TRANEXAMIC ACID-NACL 1000-0.7 MG/100ML-% IV SOLN
INTRAVENOUS | Status: DC | PRN
Start: 2023-11-29 — End: 2023-11-29
  Administered 2023-11-29: 1000 mg via INTRAVENOUS

## 2023-11-29 MED ORDER — LIDOCAINE 2% (20 MG/ML) 5 ML SYRINGE
INTRAMUSCULAR | Status: DC | PRN
  Administered 2023-11-29: 100 mg via INTRAVENOUS

## 2023-11-29 MED ORDER — PHENYLEPHRINE HCL (PRESSORS) 10 MG/ML IV SOLN
INTRAVENOUS | Status: DC | PRN
  Administered 2023-11-29: 200 ug via INTRAVENOUS
  Administered 2023-11-29: 100 ug via INTRAVENOUS
  Administered 2023-11-29: 200 ug via INTRAVENOUS

## 2023-11-29 MED ORDER — ONDANSETRON HCL 4 MG/2ML IJ SOLN: 4.0000 mg | Freq: Once | INTRAMUSCULAR | Status: DC | PRN

## 2023-11-29 MED ORDER — FENTANYL CITRATE (PF) 250 MCG/5ML IJ SOLN
INTRAMUSCULAR | Status: DC | PRN
  Administered 2023-11-29: 100 ug via INTRAVENOUS

## 2023-11-29 MED ORDER — DEXMEDETOMIDINE HCL IN NACL 80 MCG/20ML IV SOLN
INTRAVENOUS | Status: AC
  Filled 2023-11-29: qty 20

## 2023-11-29 MED ORDER — ACETAMINOPHEN 10 MG/ML IV SOLN
1000.0000 mg | Freq: Once | INTRAVENOUS | Status: DC | PRN
  Administered 2023-11-29: 1000 mg via INTRAVENOUS

## 2023-11-29 MED ORDER — OXYCODONE HCL 5 MG PO TABS
10.0000 mg | ORAL_TABLET | ORAL | Status: DC | PRN
  Administered 2023-11-29 – 2023-11-30 (×4): 10 mg via ORAL
  Filled 2023-11-29 (×4): qty 2

## 2023-11-29 MED ORDER — THROMBIN 20000 UNITS EX SOLR
CUTANEOUS | Status: AC
Start: 2023-11-29 — End: 2023-11-29
  Filled 2023-11-29: qty 20000

## 2023-11-29 MED ORDER — TRANDOLAPRIL 1 MG PO TABS
2.0000 mg | ORAL_TABLET | Freq: Every day | ORAL | Status: DC
  Administered 2023-11-29 – 2023-11-30 (×2): 2 mg via ORAL
  Filled 2023-11-29 (×2): qty 2

## 2023-11-29 MED ORDER — MIDAZOLAM HCL (PF) 2 MG/2ML IJ SOLN
INTRAMUSCULAR | Status: DC | PRN
  Administered 2023-11-29: 2 mg via INTRAVENOUS

## 2023-11-29 MED ORDER — LACTATED RINGERS IV SOLN: INTRAVENOUS | Status: DC

## 2023-11-29 MED ORDER — PROPOFOL 1000 MG/100ML IV EMUL
INTRAVENOUS | Status: AC
  Filled 2023-11-29: qty 100

## 2023-11-29 MED ORDER — ONDANSETRON HCL 4 MG PO TABS: 4.0000 mg | ORAL_TABLET | Freq: Four times a day (QID) | ORAL | Status: DC | PRN

## 2023-11-29 MED ORDER — HYDROMORPHONE HCL 1 MG/ML IJ SOLN: 0.5000 mg | INTRAMUSCULAR | Status: DC | PRN

## 2023-11-29 MED ORDER — ACETAMINOPHEN 10 MG/ML IV SOLN
INTRAVENOUS | Status: AC
  Filled 2023-11-29: qty 100

## 2023-11-29 MED ORDER — PROPOFOL 10 MG/ML IV BOLUS
INTRAVENOUS | Status: DC | PRN
  Administered 2023-11-29: 200 ug/kg/min via INTRAVENOUS
  Administered 2023-11-29: 120 mg via INTRAVENOUS
  Administered 2023-11-29: 30 mg via INTRAVENOUS
  Administered 2023-11-29: 80 mg via INTRAVENOUS

## 2023-11-29 MED ORDER — DEXMEDETOMIDINE HCL IN NACL 80 MCG/20ML IV SOLN
INTRAVENOUS | Status: DC | PRN
  Administered 2023-11-29: 4 ug via INTRAVENOUS

## 2023-11-29 MED ORDER — PHENYLEPHRINE 80 MCG/ML (10ML) SYRINGE FOR IV PUSH (FOR BLOOD PRESSURE SUPPORT)
PREFILLED_SYRINGE | INTRAVENOUS | Status: DC | PRN
Start: 2023-11-29 — End: 2023-11-29
  Administered 2023-11-29: 40 ug via INTRAVENOUS

## 2023-11-29 MED ORDER — OXYCODONE HCL 5 MG PO TABS: 5.0000 mg | ORAL_TABLET | Freq: Once | ORAL | Status: DC | PRN

## 2023-11-29 MED ORDER — BUPIVACAINE-EPINEPHRINE (PF) 0.25% -1:200000 IJ SOLN
INTRAMUSCULAR | Status: AC
Start: 2023-11-29 — End: 2023-11-29
  Filled 2023-11-29: qty 30

## 2023-11-29 MED ORDER — PROPOFOL 10 MG/ML IV BOLUS
INTRAVENOUS | Status: AC
  Filled 2023-11-29: qty 20

## 2023-11-29 MED ORDER — ONDANSETRON HCL 4 MG/2ML IJ SOLN
INTRAMUSCULAR | Status: AC
  Filled 2023-11-29: qty 2

## 2023-11-29 MED ORDER — 0.9 % SODIUM CHLORIDE (POUR BTL) OPTIME
TOPICAL | Status: DC | PRN
  Administered 2023-11-29 (×2): 1000 mL

## 2023-11-29 MED ORDER — CHLORHEXIDINE GLUCONATE 0.12 % MT SOLN: 15.0000 mL | Freq: Once | OROMUCOSAL | Status: AC

## 2023-11-29 MED ORDER — LIDOCAINE 2% (20 MG/ML) 5 ML SYRINGE
INTRAMUSCULAR | Status: AC
  Filled 2023-11-29: qty 5

## 2023-11-29 MED ORDER — POLYETHYLENE GLYCOL 3350 17 G PO PACK: 17.0000 g | PACK | Freq: Every day | ORAL | Status: DC | PRN

## 2023-11-29 MED ORDER — PROPOFOL 1000 MG/100ML IV EMUL
INTRAVENOUS | Status: AC
  Filled 2023-11-29: qty 200

## 2023-11-29 MED ORDER — PHENYLEPHRINE 80 MCG/ML (10ML) SYRINGE FOR IV PUSH (FOR BLOOD PRESSURE SUPPORT)
PREFILLED_SYRINGE | INTRAVENOUS | Status: AC
Start: 2023-11-29 — End: 2023-11-29
  Filled 2023-11-29: qty 10

## 2023-11-29 MED ORDER — OXYCODONE-ACETAMINOPHEN 10-325 MG PO TABS: 1.0000 | ORAL_TABLET | Freq: Four times a day (QID) | ORAL | 0 refills | Status: DC | PRN

## 2023-11-29 MED ORDER — MENTHOL 3 MG MT LOZG: 1.0000 | LOZENGE | OROMUCOSAL | Status: DC | PRN

## 2023-11-29 MED ORDER — SODIUM CHLORIDE 0.9% FLUSH
3.0000 mL | Freq: Two times a day (BID) | INTRAVENOUS | Status: DC
  Administered 2023-11-29 – 2023-11-30 (×3): 3 mL via INTRAVENOUS

## 2023-11-29 MED ORDER — METHOCARBAMOL 1000 MG/10ML IJ SOLN: 500.0000 mg | Freq: Four times a day (QID) | INTRAMUSCULAR | Status: DC | PRN

## 2023-11-29 MED ORDER — ALPRAZOLAM 0.5 MG PO TABS
1.0000 mg | ORAL_TABLET | Freq: Three times a day (TID) | ORAL | Status: DC
  Administered 2023-11-29 – 2023-11-30 (×4): 1 mg via ORAL
  Filled 2023-11-29 (×4): qty 2

## 2023-11-29 MED ORDER — ORAL CARE MOUTH RINSE: 15.0000 mL | Freq: Once | OROMUCOSAL | Status: AC

## 2023-11-29 MED ORDER — DEXAMETHASONE SOD PHOSPHATE PF 10 MG/ML IJ SOLN
INTRAMUSCULAR | Status: DC | PRN
  Administered 2023-11-29: 10 mg via INTRAVENOUS

## 2023-11-29 MED ORDER — EPHEDRINE SULFATE (PRESSORS) 25 MG/5ML IV SOSY
PREFILLED_SYRINGE | INTRAVENOUS | Status: DC | PRN
  Administered 2023-11-29 (×2): 2.5 mg via INTRAVENOUS

## 2023-11-29 MED ORDER — ONDANSETRON HCL 4 MG/2ML IJ SOLN: 4.0000 mg | Freq: Four times a day (QID) | INTRAMUSCULAR | Status: DC | PRN

## 2023-11-29 MED ORDER — PREGABALIN 75 MG PO CAPS
150.0000 mg | ORAL_CAPSULE | Freq: Three times a day (TID) | ORAL | Status: DC
  Administered 2023-11-29 – 2023-11-30 (×4): 150 mg via ORAL
  Filled 2023-11-29 (×4): qty 2

## 2023-11-29 MED ORDER — TRANEXAMIC ACID-NACL 1000-0.7 MG/100ML-% IV SOLN
INTRAVENOUS | Status: AC
Start: 2023-11-29 — End: 2023-11-29
  Filled 2023-11-29: qty 100

## 2023-11-29 MED ORDER — LEVOTHYROXINE SODIUM 100 MCG PO TABS
100.0000 ug | ORAL_TABLET | Freq: Every day | ORAL | Status: DC
  Administered 2023-11-30: 100 ug via ORAL
  Filled 2023-11-29: qty 1

## 2023-11-29 MED ORDER — FENTANYL CITRATE (PF) 100 MCG/2ML IJ SOLN
25.0000 ug | INTRAMUSCULAR | Status: DC | PRN
  Administered 2023-11-29 (×2): 50 ug via INTRAVENOUS

## 2023-11-29 MED ORDER — ONDANSETRON HCL 4 MG PO TABS: 4.0000 mg | ORAL_TABLET | Freq: Three times a day (TID) | ORAL | 0 refills | Status: DC | PRN

## 2023-11-29 MED ORDER — EPHEDRINE 5 MG/ML INJ
INTRAVENOUS | Status: AC
  Filled 2023-11-29: qty 5

## 2023-11-29 MED ORDER — MIDAZOLAM HCL 2 MG/2ML IJ SOLN
INTRAMUSCULAR | Status: AC
  Filled 2023-11-29: qty 2

## 2023-11-29 MED ORDER — ACETAMINOPHEN 325 MG PO TABS: 650.0000 mg | ORAL_TABLET | ORAL | Status: DC | PRN

## 2023-11-29 MED ORDER — SODIUM CHLORIDE 0.9% FLUSH: 3.0000 mL | INTRAVENOUS | Status: DC | PRN

## 2023-11-29 MED ORDER — ACETAMINOPHEN 650 MG RE SUPP: 650.0000 mg | RECTAL | Status: DC | PRN

## 2023-11-29 MED ORDER — OXYCODONE HCL 5 MG PO TABS: 5.0000 mg | ORAL_TABLET | ORAL | Status: DC | PRN

## 2023-11-29 MED ORDER — METHOCARBAMOL 500 MG PO TABS
500.0000 mg | ORAL_TABLET | Freq: Four times a day (QID) | ORAL | Status: DC | PRN
  Administered 2023-11-29 – 2023-11-30 (×3): 500 mg via ORAL
  Filled 2023-11-29 (×3): qty 1

## 2023-11-29 MED ORDER — SODIUM CHLORIDE 0.9 % IV SOLN
0.1500 ug/kg/min | INTRAVENOUS | Status: AC
  Administered 2023-11-29: .15 ug/kg/min via INTRAVENOUS
  Filled 2023-11-29: qty 2000

## 2023-11-29 MED ORDER — CHLORHEXIDINE GLUCONATE 0.12 % MT SOLN
OROMUCOSAL | Status: AC
Start: 2023-11-29 — End: 2023-11-29
  Administered 2023-11-29: 15 mL via OROMUCOSAL
  Filled 2023-11-29: qty 15

## 2023-11-29 SURGICAL SUPPLY — 65 items
ALLOGRFT BNE OSSIFUSE FBR 5CC (Bone Implant) IMPLANT
BAG COUNTER SPONGE SURGICOUNT (BAG) IMPLANT
BLADE CLIPPER SURG (BLADE) IMPLANT
BUR EGG ELITE 4.0 (BURR) IMPLANT
BUR MATCHSTICK NEURO 3.0 LAGG (BURR) IMPLANT
CABLE BIPOLOR RESECTION CORD (MISCELLANEOUS) ×1 IMPLANT
CAGE LORDOTIC 6 SM (Cage) IMPLANT
CANISTER SUCTION 3000ML PPV (SUCTIONS) ×1 IMPLANT
CLSR STERI-STRIP ANTIMIC 1/2X4 (GAUZE/BANDAGES/DRESSINGS) ×1 IMPLANT
COVER MAYO STAND STRL (DRAPES) ×3 IMPLANT
COVER SURGICAL LIGHT HANDLE (MISCELLANEOUS) ×2 IMPLANT
DEVICE ENDSKLTN IMPLANT SM 7MM (Cage) IMPLANT
DRAPE C-ARM 42X72 X-RAY (DRAPES) ×1 IMPLANT
DRAPE POUCH INSTRU U-SHP 10X18 (DRAPES) ×1 IMPLANT
DRAPE SURG 17X23 STRL (DRAPES) ×1 IMPLANT
DRAPE U-SHAPE 47X51 STRL (DRAPES) ×1 IMPLANT
DRSG OPSITE POSTOP 4X6 (GAUZE/BANDAGES/DRESSINGS) ×1 IMPLANT
DURAPREP 26ML APPLICATOR (WOUND CARE) ×1 IMPLANT
ELECT COATED BLADE 2.86 ST (ELECTRODE) ×1 IMPLANT
ELECT NVM5 SURFACE MEP/EMG (ELECTRODE) IMPLANT
ELECT PENCIL ROCKER SW 15FT (MISCELLANEOUS) ×1 IMPLANT
ELECTRODE REM PT RTRN 9FT ADLT (ELECTROSURGICAL) ×1 IMPLANT
GLOVE BIO SURGEON STRL SZ 6.5 (GLOVE) ×1 IMPLANT
GLOVE BIOGEL PI IND STRL 6.5 (GLOVE) ×1 IMPLANT
GLOVE BIOGEL PI IND STRL 8.5 (GLOVE) ×1 IMPLANT
GLOVE SS BIOGEL STRL SZ 8.5 (GLOVE) ×1 IMPLANT
GOWN STRL REUS W/ TWL LRG LVL3 (GOWN DISPOSABLE) ×2 IMPLANT
GOWN STRL REUS W/TWL 2XL LVL3 (GOWN DISPOSABLE) ×2 IMPLANT
KIT BASIN OR (CUSTOM PROCEDURE TRAY) ×1 IMPLANT
KIT TURNOVER KIT B (KITS) ×1 IMPLANT
MODULE EMG NDL SSEP NVM5 (NEUROSURGERY SUPPLIES) IMPLANT
MODULE EMG NEEDLE SSEP NVM5 (NEUROSURGERY SUPPLIES) ×1 IMPLANT
NDL HYPO 22X1.5 SAFETY MO (MISCELLANEOUS) ×1 IMPLANT
NDL SPNL 18GX3.5 QUINCKE PK (NEEDLE) ×1 IMPLANT
NEEDLE HYPO 22X1.5 SAFETY MO (MISCELLANEOUS) ×1 IMPLANT
NEEDLE SPNL 18GX3.5 QUINCKE PK (NEEDLE) ×1 IMPLANT
PACK ORTHO CERVICAL (CUSTOM PROCEDURE TRAY) ×1 IMPLANT
PACK UNIVERSAL I (CUSTOM PROCEDURE TRAY) ×1 IMPLANT
PAD ARMBOARD POSITIONER FOAM (MISCELLANEOUS) ×3 IMPLANT
PATTIES SURGICAL .5 X.5 (GAUZE/BANDAGES/DRESSINGS) IMPLANT
PIN ACP TEMP FIXATION (EXFIX) IMPLANT
PLATE ACP 1.9X54 3LVL (Plate) IMPLANT
POSITIONER HEAD DONUT 9IN (MISCELLANEOUS) ×1 IMPLANT
RESTRAINT LIMB HOLDER UNIV (RESTRAINTS) ×1 IMPLANT
SCREW ACP 3.5X13 S/D VAR ANGLE (Screw) IMPLANT
SCREW ACP VA SD 3.5X15 (Screw) IMPLANT
SCREW VA ST 4.0X15 (Screw) IMPLANT
SOLN 0.9% NACL POUR BTL 1000ML (IV SOLUTION) ×1 IMPLANT
SOLN STERILE WATER BTL 1000 ML (IV SOLUTION) ×1 IMPLANT
SPONGE INTESTINAL PEANUT (DISPOSABLE) ×2 IMPLANT
SPONGE SURGIFOAM ABS GEL 100 (HEMOSTASIS) ×1 IMPLANT
SPONGE T-LAP 4X18 ~~LOC~~+RFID (SPONGE) ×2 IMPLANT
SURGIFLO W/THROMBIN 8M KIT (HEMOSTASIS) IMPLANT
SUT BONE WAX W31G (SUTURE) ×1 IMPLANT
SUT MNCRL AB 3-0 PS2 27 (SUTURE) ×1 IMPLANT
SUT SILK 2-0 18XBRD TIE 12 (SUTURE) ×1 IMPLANT
SUT VIC AB 2-0 CT1 18 (SUTURE) ×1 IMPLANT
SYR BULB IRRIG 60ML STRL (SYRINGE) ×1 IMPLANT
SYR CONTROL 10ML LL (SYRINGE) ×1 IMPLANT
TAPE CLOTH 4X10 WHT NS (GAUZE/BANDAGES/DRESSINGS) ×1 IMPLANT
TAPE UMBILICAL 1/8X30 (MISCELLANEOUS) ×1 IMPLANT
TOWEL GREEN STERILE (TOWEL DISPOSABLE) ×1 IMPLANT
TOWEL GREEN STERILE FF (TOWEL DISPOSABLE) ×1 IMPLANT
TRAY FOLEY MTR SLVR 14FR STAT (SET/KITS/TRAYS/PACK) IMPLANT
TRAY FOLEY MTR SLVR 16FR STAT (SET/KITS/TRAYS/PACK) ×1 IMPLANT

## 2023-11-29 NOTE — Brief Op Note (Signed)
 10/04/2023   10:23 AM   PATIENT:  11/29/2023  11:39 AM  PATIENT:  Christie Fox  68 y.o. female  PRE-OPERATIVE DIAGNOSIS:  Cervical spondylotic myelopathy  POST-OPERATIVE DIAGNOSIS:  Cervical spondylotic myelopathy  PROCEDURE:  Procedure(s) with comments: ANTERIOR CERVICAL DISCECTOMY/DECOMPRESSION, FUSION OF CERVICAL FOUR TO CERVICAL FIVE, CERVICAL FIVE TO CERVICAL SIX, AND CERVICAL SIX TO CERVICAL SEVEN (N/A) - C4-C7  SURGEON:  Surgeons and Role:    DEWAINE Burnetta Aures, MD - Primary  PHYSICIAN ASSISTANT: Jeoffrey Sages, PA  ANESTHESIA:   general  EBL:  100 mL   BLOOD ADMINISTERED:none  DRAINS: none   LOCAL MEDICATIONS USED:  MARCAINE      SPECIMEN:  No Specimen  DISPOSITION OF SPECIMEN:  N/A  COUNTS:  YES  TOURNIQUET:  * No tourniquets in log *  DICTATION: .Dragon Dictation  PLAN OF CARE: Admit for overnight observation  PATIENT DISPOSITION:  PACU - hemodynamically stable.

## 2023-11-29 NOTE — Anesthesia Procedure Notes (Signed)
 Procedure Name: Intubation Date/Time: 11/29/2023 7:52 AM  Performed by: Keavon Sensing, CRNAPre-anesthesia Checklist: Patient identified, Emergency Drugs available, Suction available and Patient being monitored Patient Re-evaluated:Patient Re-evaluated prior to induction Oxygen Delivery Method: Circle System Utilized Preoxygenation: Pre-oxygenation with 100% oxygen Induction Type: IV induction and Rapid sequence Ventilation: Mask ventilation without difficulty Laryngoscope Size: Glidescope and 3 Grade View: Grade II Tube type: Oral Tube size: 7.0 mm Number of attempts: 1 Airway Equipment and Method: Stylet and Oral airway Placement Confirmation: ETT inserted through vocal cords under direct vision, positive ETCO2 and breath sounds checked- equal and bilateral Secured at: 21 cm Tube secured with: Tape Dental Injury: Teeth and Oropharynx as per pre-operative assessment  Difficulty Due To: Difficulty was anticipated

## 2023-11-29 NOTE — Anesthesia Postprocedure Evaluation (Signed)
 Anesthesia Post Note  Patient: Christie Fox  Procedure(s) Performed: ANTERIOR CERVICAL DISCECTOMY/DECOMPRESSION, FUSION OF CERVICAL FOUR TO CERVICAL FIVE, CERVICAL FIVE TO CERVICAL SIX, AND CERVICAL SIX TO CERVICAL SEVEN (Spine Cervical)     Patient location during evaluation: PACU Anesthesia Type: General Level of consciousness: awake and alert Pain management: pain level controlled Vital Signs Assessment: post-procedure vital signs reviewed and stable Respiratory status: spontaneous breathing, nonlabored ventilation, respiratory function stable and patient connected to nasal cannula oxygen Cardiovascular status: blood pressure returned to baseline and stable Postop Assessment: no apparent nausea or vomiting Anesthetic complications: yes (IV extravasation) Comments: Notified by CRNA of peripheral IV extravasation evident upon untucking of the patient's arms at the end of surgery. This IV was placed by me via ultrasound in preop and appeared to be functioning well for induction and as the primary IV carrying our TIVA agents. The patient denies any recall of surgery which leads me to believe that the IV may have extravasated at the end of the case. An ice pack was applied in pacu and the arm has been elevated. She has a strong radial pulse and reports mild numbness in the hand, similar to prior to surgery and equal to the opposite hand (surgery was ACDF). The swelling appears to be improving. Safe for discharge to floor bed with overnight observation of the affected extremity.   Christie Rockey Cornea MD    No notable events documented.  Last Vitals:  Vitals:   11/29/23 1230 11/29/23 1245  BP: (!) 146/86 123/74  Pulse: 96 92  Resp: (!) 9 11  Temp:    SpO2: 93% 93%    Last Pain:  Vitals:   11/29/23 1248  TempSrc:   PainSc: 8     LLE Motor Response: Purposeful movement (11/29/23 1245) LLE Sensation: Full sensation (11/29/23 1245) RLE Motor Response: Purposeful movement (11/29/23  1245) RLE Sensation: Full sensation (11/29/23 1245)      Christie Fox

## 2023-11-29 NOTE — Plan of Care (Signed)

## 2023-11-29 NOTE — Discharge Instructions (Signed)
 Today you will be discharged from the hospital.  The purpose of the following handout is to help guide you over the next 2 weeks.  First and foremost, be sure you have a follow up appointment with Dr. Shon Baton 2 weeks from the time of your surgery to have your sutures removed.  Please call [facility] [phone] to schedule or confirm this appointment.      Brace You do not have to wear the collar while lying in bed or sitting in a high-backed chair, eating, sleeping or showering.  Other than these instances, you must wear the brace.  You may NOT wear the collar while driving a vehicle (see driving restrictions below).  It is advisable that you wear the collar in public places or while traveling in a car as a passenger.  Dr. Shon Baton will discuss further use of the collar at your 2 week postop visit.  Wound Care You may SHOWER 5 days from the date of surgery.  Shower directly over the steri-strips.  DO NOT scrub or submerge (bath tub, swimming pool, hot tub, etc.) the area.  Pat to dry following your shower.  There is no need for additional dressings other than the steri-strips.  Allow the steri-strips to fall off on their own.  Once the strips have fallen off, you may leave the area undressed.  DO NOT apply lotion/cream/ointment to the area.  The wound must remain dry at all times other than while showering.  Dr. Shon Baton or his staff will remove your stiches at your first postop visit and give you additional instructions regarding wound care at that time.   Activity NO DRIVING FOR 2 WEEKS.  No lifting over 5 pounds (approximately a gallon of milk).  No bending, stooping, squatting or twisting.  No overhead activities.  We encourage you to walk (short distances and often throughout the day) as you can tolerate.  A good rule of thumb is to get up and move once or twice every hour.  You may go up and down stairs carefully.  As you continue to recover, Dr. Shon Baton will address and  adjust restrictions to your activities until no further restrictions are needed.  However, until your first postop visit, when Dr. Shon Baton can assess your recovery, you are to follow these instructions.  At the end of this document is a tentative outline of activities for up to 1 year.       Medication You will be discharged from the hospital with medication for pain, spasm, nausea and constipation.  You will be given enough medication to last until your first postop visit in 2 weeks.  Medications WILL NOT BE REFILLED EARLY; therefore, you are to take the medications only as directed.  If you have been given multiple prescriptions, please leave them with your pharmacy.  They can keep them on file for when you need them.  Medications that are lost or stolen WILL NOT be replaced.  We will address the need for continuing certain medications on an individual basis during your postop visit.  We ask that you avoid over the counter anti-inflammatory medications (Advil, Aleve, Motrin) for 3 months.    What you can expect following neck surgery... It is not uncommon to experience a sore throat or difficulty swallowing following neck surgery.  Cold liquids and soft foods are helpful in soothing this discomfort.  There is no specific diet that you are to follow after surgery, however, there are a  few things you should keep in mind to avoid unneeded discomfort.  Take small bites and eat slowly.  Chew your food thoroughly before swallowing.   It is not uncommon to experience incisional soreness or pain in the back of the neck, shoulders or between the shoulder blades.  These symptoms will slowly begin to resolve as you continue to recover, however, they can last for a few weeks.    It is not uncommon to experience INTERMITTENT arm pain following surgery.  This pain can mimic the arm pain you had prior to surgery.  As long as the pain resolves on its own and is not constant, there is no need to become alarmed.    When To Call If you experience fever >101F, loss of bowel or bladder control, painful swelling in the lower extremities, constant (unresolving) arm pain.  If you experience any of these symptoms, please call [facility] [phone].  What's Next As mentioned earlier, you will follow up with Dr. Shon Baton in 2 weeks.  At that time, we will likely remove your stitches and discuss additional aspects of your recovery.                   ACTIVITY GUIDELINES ANTERIOR CERVICAL DISECTOMY AND FUSION  Activity Discharge 2 weeks 6 weeks 3 months 6 months 1 year  Shower 5 days        Submerge the wound  no no yes     Walking outdoors yes       Lifting 5 lbs yes       Climbing stairs yes       Cooking yes       Car rides (less than 30 minutes) yes       Car rides (greater than 30 minutes) no varies yes     Air travel no varies yes     Short outings [employer], visits, etc...) yes       School no no yes     Driving a car no no varies yes    Light upper extremity exercises no no varies yes    Stationary bike no no yes     Swimming (no diving) no no no varies yes   Vacuuming, laundry, mopping no no no varies yes   Biking outdoors no no no no varies yes  Light jogging no no no varies yes   Low impact aerobics no no no varies yes   Non-contact sports (tennis, golf) no no no varies yes   Hunting (no tree climbing) no no no varies yes   Dancing (non-gymnastics) no no no varies yes   Down-hill skiing (experienced skier) no no no no yes   Down-hill skiing (novice) no no no no yes   Cross-country skiing no no no no yes   Horseback riding (noncompetitive)  no no no no yes   Horseback riding (competitive) no no no no varies yes  Gardening/landscaping no no no varies yes   House repairs no no no varies varies yes  Lifting up to 50 lbs no no no no varies yes

## 2023-11-29 NOTE — Anesthesia Preprocedure Evaluation (Signed)
 Anesthesia Evaluation  Patient identified by MRN, date of birth, ID band Patient awake    Reviewed: Allergy & Precautions, NPO status , Patient's Chart, lab work & pertinent test results  History of Anesthesia Complications (+) PONV and history of anesthetic complications  Airway Mallampati: II  TM Distance: >3 FB Neck ROM: Limited    Dental no notable dental hx.    Pulmonary neg COPD   breath sounds clear to auscultation       Cardiovascular hypertension, (-) CAD, (-) Past MI and (-) Cardiac Stents  Rhythm:Regular Rate:Normal     Neuro/Psych  Headaches  Anxiety      Neuromuscular disease    GI/Hepatic ,neg GERD  ,,(+) neg Cirrhosis        Endo/Other  Hypothyroidism    Renal/GU Renal disease     Musculoskeletal  (+) Arthritis ,    Abdominal   Peds  Hematology   Anesthesia Other Findings   Reproductive/Obstetrics                              Anesthesia Physical Anesthesia Plan  ASA: 2  Anesthesia Plan: General   Post-op Pain Management:    Induction: Intravenous  PONV Risk Score and Plan: 2 and Ondansetron  and Dexamethasone   Airway Management Planned: Oral ETT and Video Laryngoscope Planned  Additional Equipment:   Intra-op Plan:   Post-operative Plan: Extubation in OR  Informed Consent: I have reviewed the patients History and Physical, chart, labs and discussed the procedure including the risks, benefits and alternatives for the proposed anesthesia with the patient or authorized representative who has indicated his/her understanding and acceptance.     Dental advisory given  Plan Discussed with: CRNA  Anesthesia Plan Comments:         Anesthesia Quick Evaluation

## 2023-11-29 NOTE — Op Note (Signed)
 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  No adverse free running EMG throughout the case.  Implants: Titan intervertebral cages.  C4-5: 7 mm small lordotic.  C5-7: 6 mm small lordotic. NuVasive ACP cervical plate: 54 mm length.  15 mm locking screws at C4 and C7, 13 mm locking screws C5 and C6 (plate was not integrated to the cages.  These are 2 separate devices)  Graft: ossifuse \  BRIEF HISTORY: SALIA CANGEMI is a 68 y.o. female who presents to my office with complaints of significant neck and radicular right arm pain as well as difficulty maintaining her balance.  Imaging demonstrated three-level cervical spondylitic disease with cord signal changes at C5-6.  After discussing treatment options we elected to move forward with a three-level ACDF.  All appropriate risks benefits and alternatives were discussed consent was obtained.  PROCEDURE DETAILS: Patient was brought into the operating room and was properly positioned on the operating room table.  After induction with general anesthesia the patient was endotracheally intubated.  A timeout was taken to confirm all important data: including patient, procedure, and the level. Teds, SCD's were applied.   Patient was placed on the operating room table and a Foley was inserted by the nurse and the neuromonitoring needles were placed by the representative.  The anterior cervical spine was then prepped and draped in a standard fashion.  Using fluoroscopy I marked out the C5-6 disc space and  infiltrated my incision with quarter percent Marcaine  with epinephrine .  Transverse incision was made and sharp dissection was carried out down to and through the platysma.  I continued dissecting sharply in the avascular plane performing a standard Smith-Robinson approach to the cervical spine.  The omohyoid was released from its sling but I did not feel necessary to sacrifice this for visualization.  I then swept the trachea and esophagus over to the right and using Kitner dissectors to expose the interior cervical spine from C4 to C7.  Was placed into the C4-5 disc space and an x-ray was taken confirming I was at the appropriate level.  I then used a Bovie to mark the disc space.  I then used the double-action Leksell rongeur's to remove the large anterior exostosis that had formed over each of the disc spaces.  Then mobilized the longus coli muscles from the mid body of C4 to the mid body of C7.  Caspar distraction blades were placed underneath the longus coli muscle at the C6-7 level and I deflated endotracheal cuff and expanded the retractor to the appropriate width.  An annulotomy was performed at C6-7 and I remove the bulk of the disc material with pituitary rongeurs.  Distraction pins were then placed into the body of C6 and C7 and I distracted the intervertebral space with a lamina spreader and maintained it distraction pin set.  Using pituitary rongeurs curettes and Kerrison rongeurs I removed all of the disc material.  A fine hook was used to dissect underneath the annulus and I resected the posterior osteophyte and annulus with a 1 mm Kerrison rongeur.  I  then swept underneath to develop a plane between the thecal sac and the posterior longitudinal ligament.  Once I created this plane I used my 1 mm Kerrison rongeur to resect the PLL.  I then undercut the uncovertebral joints to further decompress the nerve roots.  Once the decompression proved satisfactory I irrigated the wound copiously normal  saline and then placed my trial devices.  Once I confirmed I had satisfactory positioning with the 6 mm small implant I rasped the endplates slightly the subchondral bone.  I then irrigated and then inserted the device.  Using fluoroscopy I confirmed satisfactory final positioning.  I then removed the distraction pin from C7 and sealed the hole with bone wax.  I then reposition this into the C5 vertebral body.  I then repositioned the retractor blades to expose the C5-6 level.  Using the same technique I used at C6-7 and performed a complete discectomy at this level.  I again resected the posterior annulus and developed a plane underneath the PLL and resected the PLL.  I then undercut the uncovertebral joint in the same fashion that I had done at the previous level.  I then rasped the endplates and trialed the intervertebral spacers.  I then irrigated the wound and confirmed once again with a nerve hook that I could freely pass my hook under the uncovertebral joint and under the vertebral bodies confirming satisfactory decompression.  The implant was packed with the allograft and malleted to the appropriate depth.  I reposition my distraction pin from the body of C6 to the body of C4 and repositioned my retractor blades to expose the C4-5 disc space.  I again utilized the same technique to perform a complete discectomy and decompress this level.  Once the decompression proved satisfactory I trialed and elected to use a 7 mm implant.  The implant was packed with allograft and malleted to the appropriate depth.    I then irrigated the wound copiously with normal saline and using fluoroscopy confirmed satisfactory positioning of all 3 implants in both planes.  I then contoured the 54 mm length plate and secured it with the appropriate locking screws.  All the locking screws had excellent purchase.  The locking device on the plate was then engaged to prevent backup of the screws.  It should be noted that the  cervical plate was independent of the cages.  This was not an integrated system.  I irrigated the wound copious normal saline and returned the trachea and esophagus to midline.  There was hemostasis was obtained and confirmed.  The platysma was then closed with interrupted 2-0 Vicryl suture followed by 3-0 Monocryl for the skin.  Steri-Strips dry dressing and a collar were applied.  Final x-rays demonstrated satisfactory positioning of all the implants in both planes.  At the end of the case all needle and sponge counts were correct.  Donaciano Sprang, MD 11/29/2023 11:27 AM

## 2023-11-29 NOTE — Transfer of Care (Signed)
 Immediate Anesthesia Transfer of Care Note  Patient: Christie Fox  Procedure(s) Performed: ANTERIOR CERVICAL DISCECTOMY/DECOMPRESSION, FUSION OF CERVICAL FOUR TO CERVICAL FIVE, CERVICAL FIVE TO CERVICAL SIX, AND CERVICAL SIX TO CERVICAL SEVEN (Spine Cervical)  Patient Location: PACU  Anesthesia Type:General  Level of Consciousness: awake, alert , and oriented  Airway & Oxygen Therapy: Patient Spontanous Breathing  Post-op Assessment: Report given to RN and Post -op Vital signs reviewed and stable  Post vital signs: Reviewed  Last Vitals:  Vitals Value Taken Time  BP 146/86 11/29/23 12:30  Temp 36.7 C 11/29/23 11:55  Pulse 95 11/29/23 12:35  Resp 9 11/29/23 12:35  SpO2 93 % 11/29/23 12:35  Vitals shown include unfiled device data.  Last Pain:  Vitals:   11/29/23 1225  TempSrc:   PainSc: 10-Worst pain ever      Patients Stated Pain Goal: 3 (11/29/23 0705)  Complications: No notable events documented.

## 2023-11-29 NOTE — H&P (Signed)
 History: Christie Fox is a very pleasant 68 year old female who presents today for her neck pain. She states has been going on for about a year but has worsened in the last 10 months. She does have bilateral radicular arm pain mainly on the right over the left in the C6 and C7 dermatome.  Imaging and clinical exam consistent with cervical spondylitic myeloradiculopathy.  As a result we have elected to move forward with a three-level ACDF.  Past Medical History:  Diagnosis Date   Anxiety    Arthritis    At risk for sleep apnea    STOP-BANG= 4    SENT TO PCP 07-31-2013   Borderline diabetes    diet controlled   Breast cancer (HCC)    Dysuria    History of kidney stones    surgery   Hyperlipidemia    Hypertension    Hypothyroidism    Migraines    PONV (postoperative nausea and vomiting)    please call me Christie Fox to wake me up , not [name]    Pre-diabetes    diet control - no meds   Urgency of urination    Walker as ambulation aid     Allergies  Allergen Reactions   Shellfish Allergy Anaphylaxis    Pt states that this is NOT an allergy for her.    No current facility-administered medications on file prior to encounter.   Current Outpatient Medications on File Prior to Encounter  Medication Sig Dispense Refill   ALPRAZolam (XANAX) 1 MG tablet Take 1 mg by mouth 3 (three) times daily.     calcium carbonate (OS-CAL) 1250 (500 Ca) MG chewable tablet Chew 1 tablet by mouth daily.     furosemide (LASIX) 40 MG tablet Take 40 mg by mouth 2 (two) times daily.     levothyroxine (SYNTHROID, LEVOTHROID) 100 MCG tablet Take 100 mcg by mouth daily before breakfast.     Multiple Vitamin (MULTIVITAMIN WITH MINERALS) TABS tablet Take 1 tablet by mouth daily.     perindopril (ACEON) 4 MG tablet Take 4 mg by mouth daily.      pregabalin (LYRICA) 150 MG capsule Take 150 mg by mouth in the morning, at noon, and at bedtime.     rosuvastatin (CRESTOR) 20 MG tablet Take 20 mg by mouth daily.      tizanidine (ZANAFLEX) 2 MG capsule Take 2 mg by mouth at bedtime.      Physical Exam: Vitals:   11/29/23 0558  BP: 136/77  Pulse: 72  Resp: 20  Temp: 98 F (36.7 C)  SpO2: 98%   Body mass index is 41.76 kg/m. General: AAOX3, well developed and well nourished, NAD Ambulation: abnormal gait pattern, uses no assistive device currently, patient states in the afternoon at home she will use a walker as her balance deteriorates.  Inspection: No obvious deformity, scoliosis, kyphosis, loss of lordotic curve. Heart: RRR Lungs: Normal pulmonary effort, no signs of respiratory distress Abdomen: Normal BSX4, non-tender, non-distended, no hepatosplenomegaly. Skin: No abnormal lesions, abrasions, or contusions.  Palpation: Tender over spinous processes and neck musculature.  AROM Cervical Spine: - Fwd Flexion: normal and elicits pain - Extension: normal and elicits pain - Lateral bending to left: normal and elicits pain - Lateral Bending to right: normal and elicits pain - Rotation to Left: normal and elicits pain - Rotation to Right: normal and elicits pain -Shoulder, elbow, and wrists AROM normal and pain free.  Dermatomes: UE dermatomes abnormal to light touch  bilaterally. Decreased sensation in the bilateral upper extremities. Positive dysesthesias in the C6 and C7 dermatome distribution bilaterally Myotomes:  - C5: Left 5/5, Right 5/5 -C6: Left 5/5, Right 5/5 - C7: Left 4/5, Right 4/5 - C8: Left 4/5, Right 4/5   Reflexes:  - Biceps: Left2+, Right 2+ - Brachioradialius: Left2+, Right 2+ - Triceps: Left 2+, Right 2+ - Hoffman's: positive   Special Tests: - Heel to toe walking: Unable to maintain balance.  Positive ataxia pattern -Spurling: Left: Positive Right: Positive  PV: Extremities warm and well perfused. Distal pulses 2+ bilaterally.  Cervical xrays: No evidence of degenerative scoliosis, misalignment of the spine, or abnormal rotation. Evidence of a grade 1  anterolisthesis of C4-C5. There is disc disease at C5-C6 and C6-C7 with disc space narrowing. No signs of fractures, bony lesions, or other bony abnormalities.  MRI Impression:  The Cervical MRI preformed on 10/21/2023: abnormal T2 signal within the spinal cord at C5 vertebral body level likely representing focal myomalacia.  At C4-C5 there is an anterolisthesis. There is severe central spinal canal stenosis and mild spinal cord compression at C5-C6. There is moderate to severe central spinal canal stenosis at C6-C7.   A/P:  Assessment: Christie Fox is a very pleasant 68 year old female who presents today for her neck pain. She states has been going on for about a year but has worsened in the last 10 months. She does have bilateral radicular arm pain mainly on the right over the left in the C6 and C7 dermatome. On exam she has a positive Spurling sign. She has diminished upper extremity strength bilaterally, positive heel-toe walking sign and decreased balance. She has tried physical therapy and over-the-counter treatment options and these have not been helping. X-ray imaging demonstrates degenerative disc disease at C5-C7 as well as an anterior listhesis at C4-C5. MRI imaging demonstrates abnormal T2 signal within the spinal cord at C5 vertebral body level likely representing focal myomalacia. There is severe central spinal canal stenosis and mild spinal cord compression at C5-C6. There is moderate to severe central spinal canal stenosis at C6-C7. Due to the patient having cord signal changes, decreased quality of life, failed conservative treatment measures we do believe that surgical intervention is warranted. Therefore we will plan to proceed with an ACDF of C4 the C7. We will obtain preoperative medical clearance. All questions were welcomed and addressed. We did discuss the risks of the surgery with the patient today. Risks and benefits of surgery were discussed with the patient. These include: Infection,  bleeding, death, stroke, paralysis, ongoing or worse pain, need for additional surgery, nonunion, leak of spinal fluid, adjacent segment degeneration requiring additional fusion surgery. Pseudoarthrosis (nonunion)requiring supplemental posterior fixation. Throat pain, swallowing difficulties, hoarseness or change in voice.   Since this a multi-level we will order a external bone stimulator from [facility] for post-postoperative care   Impression: Cervical radiculopathy; cervical myelopathy; cervical spondylosis; anterolisthesis  Surgical plan: ACDF C4-7.

## 2023-11-30 ENCOUNTER — Encounter (HOSPITAL_COMMUNITY): Payer: Self-pay | Admitting: Orthopedic Surgery

## 2023-11-30 MED ORDER — METHOCARBAMOL 500 MG PO TABS: 500.0000 mg | ORAL_TABLET | Freq: Four times a day (QID) | ORAL | 0 refills | Status: AC | PRN

## 2023-11-30 MED ORDER — ONDANSETRON HCL 4 MG PO TABS: 4.0000 mg | ORAL_TABLET | Freq: Three times a day (TID) | ORAL | 0 refills | Status: AC | PRN

## 2023-11-30 MED ORDER — OXYCODONE-ACETAMINOPHEN 10-325 MG PO TABS: 1.0000 | ORAL_TABLET | Freq: Four times a day (QID) | ORAL | 0 refills | Status: AC | PRN

## 2023-11-30 NOTE — Discharge Summary (Signed)
 Patient ID: Christie Fox MRN: [id number] DOB/AGE: 04/28/1955 68 y.o.  Admit date: 11/29/2023 Discharge date: 11/30/2023  Admission Diagnoses:  Principal Problem:   Fusion of spine, cervical region   Discharge Diagnoses:  Principal Problem:   Fusion of spine, cervical region  status post Procedure(s): ANTERIOR CERVICAL DISCECTOMY/DECOMPRESSION, FUSION OF CERVICAL FOUR TO CERVICAL FIVE, CERVICAL FIVE TO CERVICAL SIX, AND CERVICAL SIX TO CERVICAL SEVEN  Past Medical History:  Diagnosis Date   Anxiety    Arthritis    At risk for sleep apnea    STOP-BANG= 4    SENT TO PCP 07-31-2013   Borderline diabetes    diet controlled   Breast cancer (HCC)    Dysuria    History of kidney stones    surgery   Hyperlipidemia    Hypertension    Hypothyroidism    Migraines    PONV (postoperative nausea and vomiting)    please call me [name] to wake me up , not [name]    Pre-diabetes    diet control - no meds   Urgency of urination    Walker as ambulation aid     Surgeries: Procedure(s): ANTERIOR CERVICAL DISCECTOMY/DECOMPRESSION, FUSION OF CERVICAL FOUR TO CERVICAL FIVE, CERVICAL FIVE TO CERVICAL SIX, AND CERVICAL SIX TO CERVICAL SEVEN on 11/29/2023   Consultants:   Discharged Condition: Improved  Hospital Course: Christie Fox is an 68 y.o. female who was admitted 11/29/2023 for operative treatment of Fusion of spine, cervical region. Patient failed conservative treatments (please see the history and physical for the specifics) and had severe unremitting pain that affects sleep, daily activities and work/hobbies. After pre-op clearance, the patient was taken to the operating room on 11/29/2023 and underwent  Procedure(s): ANTERIOR CERVICAL DISCECTOMY/DECOMPRESSION, FUSION OF CERVICAL FOUR TO CERVICAL FIVE, CERVICAL FIVE TO CERVICAL SIX, AND CERVICAL SIX TO CERVICAL SEVEN.    Patient was given perioperative antibiotics:  Anti-infectives (From admission, onward)    Start      Dose/Rate Route Frequency Ordered Stop   11/29/23 1330  ceFAZolin  (ANCEF ) IVPB 1 g/50 mL premix        1 g 100 mL/hr over 30 Minutes Intravenous Every 8 hours 11/29/23 1319 11/29/23 2141   11/29/23 0601  ceFAZolin  (ANCEF ) IVPB 2g/100 mL premix        2 g 200 mL/hr over 30 Minutes Intravenous 30 min pre-op 11/29/23 0601 11/29/23 0803        Patient was given sequential compression devices and early ambulation to prevent DVT.   Patient benefited maximally from hospital stay and there were no complications. At the time of discharge, the patient was urinating/moving their bowels without difficulty, tolerating a regular diet, pain is controlled with oral pain medications and they have been cleared by PT/OT.   Recent vital signs: Patient Vitals for the past 24 hrs:  BP Temp Temp src Pulse Resp SpO2  11/30/23 0436 127/70 98.1 F (36.7 C) Oral 93 18 96 %  11/29/23 2237 135/75 98.3 F (36.8 C) Oral (!) 102 18 94 %  11/29/23 1922 (!) 148/81 98.2 F (36.8 C) Oral 100 18 93 %  11/29/23 1619 131/78 97.6 F (36.4 C) Oral 100 17 93 %  11/29/23 1342 138/80 97.8 F (36.6 C) Oral 94 17 95 %  11/29/23 1300 134/74 98 F (36.7 C) -- 94 12 94 %  11/29/23 1245 123/74 -- -- 92 11 93 %  11/29/23 1230 (!) 146/86 -- -- 96 (!) 9 93 %  11/29/23 1215 (!) 143/81 -- -- 96 18 94 %  11/29/23 1200 (!) 152/91 -- -- 95 13 96 %  11/29/23 1155 (!) 155/81 98 F (36.7 C) -- 97 16 94 %     Recent laboratory studies: No results for input(s): WBC, HGB, HCT, PLT, NA, K, CL, CO2, BUN, CREATININE, GLUCOSE, INR, CALCIUM in the last 72 hours.  Invalid input(s): PT, 2   Discharge Medications:   Allergies as of 11/30/2023       Reactions   Shellfish Allergy Anaphylaxis   Pt states that this is NOT an allergy for her.        Medication List     STOP taking these medications    multivitamin with minerals Tabs tablet       TAKE these medications    ALPRAZolam 1 MG  tablet Commonly known as: XANAX Take 1 mg by mouth 3 (three) times daily.   calcium carbonate 1250 (500 Ca) MG chewable tablet Commonly known as: OS-CAL Chew 1 tablet by mouth daily.   furosemide 40 MG tablet Commonly known as: LASIX Take 40 mg by mouth 2 (two) times daily.   levothyroxine 100 MCG tablet Commonly known as: SYNTHROID Take 100 mcg by mouth daily before breakfast.   methocarbamol 500 MG tablet Commonly known as: ROBAXIN Take 1 tablet (500 mg total) by mouth every 6 (six) hours as needed for up to 5 days for muscle spasms.   ondansetron  4 MG tablet Commonly known as: Zofran  Take 1 tablet (4 mg total) by mouth every 8 (eight) hours as needed for nausea or vomiting.   oxyCODONE -acetaminophen  10-325 MG tablet Commonly known as: Percocet Take 1 tablet by mouth every 6 (six) hours as needed for up to 5 days for pain.   perindopril 4 MG tablet Commonly known as: ACEON Take 4 mg by mouth daily.   pregabalin 150 MG capsule Commonly known as: LYRICA Take 150 mg by mouth in the morning, at noon, and at bedtime.   rosuvastatin 20 MG tablet Commonly known as: CRESTOR Take 20 mg by mouth daily.   tizanidine 2 MG capsule Commonly known as: ZANAFLEX Take 2 mg by mouth at bedtime.        Diagnostic Studies: DG Cervical Spine 2 or 3 views Result Date: 11/29/2023 CLINICAL DATA:  Elective surgery. EXAM: CERVICAL SPINE - 2-3 VIEW COMPARISON:  None Available. FINDINGS: Six intraoperative fluoroscopic spot images provided. Total fluoroscopic time is 1 minutes 38 seconds with a cumulative air Karma of 25.43 mGy. C4-C7 ACDF. IMPRESSION: Intraoperative fluoroscopic spot images of the cervical spine. Electronically Signed   By: Vanetta Chou M.D.   On: 11/29/2023 14:52   DG C-Arm 1-60 Min-No Report Result Date: 11/29/2023 Fluoroscopy was utilized by the requesting physician.  No radiographic interpretation.   DG C-Arm 1-60 Min-No Report Result Date:  11/29/2023 Fluoroscopy was utilized by the requesting physician.  No radiographic interpretation.   DG C-Arm 1-60 Min-No Report Result Date: 11/29/2023 Fluoroscopy was utilized by the requesting physician.  No radiographic interpretation.   DG C-Arm 1-60 Min-No Report Result Date: 11/29/2023 Fluoroscopy was utilized by the requesting physician.  No radiographic interpretation.       Follow-up Information     Burnetta Aures, MD. Schedule an appointment as soon as possible for a visit in 2 week(s).   Specialty: Orthopedic Surgery Why: If symptoms worsen, For suture removal, For wound re-check Contact information: [address] [place] [address] [phone]  Discharge Plan:  discharge to home   Disposition:  Tykisha is a very pleasant 68 year old female  who is POD1 from ACDF C4-C7. Surgical intervention was successful and without complications. Her hospital course has been uncomplicated. She states that her pre-operative arm pain is the same since surgery. She is ambulating on her own. She is tolerating oral intake well. She is compliant with the ASPEN Collar. She is complaint with the incentive spirometer. She reports that his pain is well controled with oral pain medications. Dressing is c/d/I.  Positive void, positive flatus, negative BM. Patient will follow up with us  in clinic in 2 weeks. Patient understands that she cannot shower for the first 5 days post-op. All questions were welcomed and addressed.    Signed: Jeoffrey LOISE Sages PA-C for Dr. Donaciano Sprang [facility] [phone] 11/30/2023, 7:16 AM

## 2023-11-30 NOTE — Progress Notes (Signed)
 PT Cancellation Note  Patient Details Name: Christie Fox MRN: [id number] DOB: Jul 30, 1955   Cancelled Treatment:    Reason Eval/Treat Not Completed: PT screened, no needs identified, will sign off. OT to address all therapy needs.   Erven Sari Shaker 11/30/2023, 8:50 AM

## 2023-11-30 NOTE — Progress Notes (Signed)
    Subjective: Procedure(s) (LRB): ANTERIOR CERVICAL DISCECTOMY/DECOMPRESSION, FUSION OF CERVICAL FOUR TO CERVICAL FIVE, CERVICAL FIVE TO CERVICAL SIX, AND CERVICAL SIX TO CERVICAL SEVEN (N/A) 1 Day Post-Op  Patient reports pain as moderate.  Reports unchanged arm pain reports incisional neck pain   Positive void Negative bowel movement Positive flatus Negative chest pain or shortness of breath  Objective: Vital signs in last 24 hours: Temp:  [97.6 F (36.4 C)-98.3 F (36.8 C)] 98.1 F (36.7 C) (10/31 0436) Pulse Rate:  [92-102] 93 (10/31 0436) Resp:  [9-18] 18 (10/31 0436) BP: (123-155)/(70-91) 127/70 (10/31 0436) SpO2:  [93 %-96 %] 96 % (10/31 0436)  Intake/Output from previous day: 10/30 0701 - 10/31 0700 In: 1460 [P.O.:360; I.V.:1000; IV Piggyback:100] Out: 1325 [Urine:1225; Blood:100]  Labs: No results for input(s): WBC, RBC, HCT, PLT in the last 72 hours. No results for input(s): NA, K, CL, CO2, BUN, CREATININE, GLUCOSE, CALCIUM in the last 72 hours. No results for input(s): LABPT, INR in the last 72 hours.  Physical Exam: Intact pulses distally Incision: dressing C/D/I No cellulitis present Compartment soft Body mass index is 41.76 kg/m.  Assessment/Plan: Christie Fox is a very pleasant 68 year old female  who is POD1 from ACDF C4-C7. Surgical intervention was successful and without complications. Her hospital course has been uncomplicated. She states that her pre-operative arm pain is the same since surgery. She is ambulating on her own. She is tolerating oral intake well. She is compliant with the ASPEN Collar. She is complaint with the incentive spirometer. She reports that his pain is well controled with oral pain medications. Dressing is c/d/I.  Positive void, positive flatus, negative BM. Patient will follow up with us  in clinic in 2 weeks. Patient understands that she cannot shower for the first 5 days post-op. All questions were  welcomed and addressed.    Plan: Patient stable  Mobilization with physical therapy Encourage incentive spirometry Continue care  Continue pain medical mgmt D/c to home either later this afternoon or tomorrow morning pending pain control    Dammon Makarewicz Candance PA-C for Dr. Donaciano Sprang, MD [facility] [phone]

## 2023-11-30 NOTE — Progress Notes (Signed)
 Patient alert and oriented, mae's well, voiding adequate amount of urine, swallowing without difficulty, no c/o pain at time of discharge. Patient discharged home with husband. Script and discharged instructions given to patient. Patient and husband stated understanding of instructions given. Room was checked and accounted for all patient's belongings; discharge instructions concerning her medications, incision care, follow up appointment and when to call the doctor as needed were all discussed with patient by RN and She expressed understanding on the instructions given.

## 2023-11-30 NOTE — Evaluation (Signed)
 Occupational Therapy Evaluation Patient Details Name: Christie Fox MRN: [id number] DOB: 12-Mar-1955 Today's Date: 11/30/2023   History of Present Illness   68 yo F s/p ACDF.  Anxiety and arthritis.     Clinical Impressions Patient admitted for the diagnosis above.  PTA she lives at home with her spouse, who assists as needed.  Patient is sore, but near her functional baseline.  Good understanding of precautions and brace management.  No further OT needs in the acute setting.  Recommend home with assist as needed.       If plan is discharge home, recommend the following:   Assist for transportation;A little help with bathing/dressing/bathroom;Assistance with cooking/housework     Functional Status Assessment   Patient has not had a recent decline in their functional status     Equipment Recommendations   None recommended by OT     Recommendations for Other Services         Precautions/Restrictions   Precautions Precautions: Cervical Precaution Booklet Issued: Yes (comment) Recall of Precautions/Restrictions: Intact Required Braces or Orthoses: Cervical Brace Cervical Brace: For comfort Restrictions Weight Bearing Restrictions Per Provider Order: No     Mobility Bed Mobility Overal bed mobility: Needs Assistance Bed Mobility: Sidelying to Sit, Sit to Sidelying   Sidelying to sit: Supervision     Sit to sidelying: Min assist      Transfers Overall transfer level: Modified independent Equipment used: Rolling walker (2 wheels)                      Balance Overall balance assessment: Needs assistance Sitting-balance support: Feet supported Sitting balance-Leahy Scale: Good     Standing balance support: Reliant on assistive device for balance Standing balance-Leahy Scale: Fair                             ADL either performed or assessed with clinical judgement   ADL Overall ADL's : At baseline                                        General ADL Comments: Assist as needed for lower body dressing from spouse.  Spouse assists with iADL, meals and commuity mobility.     Vision Patient Visual Report: No change from baseline       Perception Perception: Not tested       Praxis Praxis: Not tested       Pertinent Vitals/Pain Pain Assessment Pain Assessment: Faces Faces Pain Scale: Hurts a little bit Pain Location: Neck Pain Descriptors / Indicators: Aching Pain Intervention(s): Monitored during session     Extremity/Trunk Assessment Upper Extremity Assessment Upper Extremity Assessment: Overall WFL for tasks assessed   Lower Extremity Assessment Lower Extremity Assessment: Overall WFL for tasks assessed   Cervical / Trunk Assessment Cervical / Trunk Assessment: Neck Surgery   Communication Communication Communication: No apparent difficulties   Cognition Arousal: Alert Behavior During Therapy: WFL for tasks assessed/performed Cognition: No apparent impairments                               Following commands: Intact       Cueing  General Comments   Cueing Techniques: Verbal cues   VSS   Exercises     Shoulder Instructions  Home Living Family/patient expects to be discharged to:: Private residence Living Arrangements: Spouse/significant other Available Help at Discharge: Family;Available 24 hours/day Type of Home: House Home Access: Stairs to enter [employer] of Steps: 1   Home Layout: One level     Bathroom Shower/Tub: Producer, Television/film/video: Standard Bathroom Accessibility: Yes How Accessible: Accessible via walker Home Equipment: Rolling Walker (2 wheels);Shower seat;Grab bars - tub/shower          Prior Functioning/Environment Prior Level of Function : Independent/Modified Independent                    OT Problem List: Pain   OT Treatment/Interventions:        OT Goals(Current goals  can be found in the care plan section)   Acute Rehab OT Goals Patient Stated Goal: Return home OT Goal Formulation: With patient Time For Goal Achievement: 12/03/23 Potential to Achieve Goals: Good   OT Frequency:       Co-evaluation              AM-PAC OT 6 Clicks Daily Activity     Outcome Measure Help from another person eating meals?: None Help from another person taking care of personal grooming?: None Help from another person toileting, which includes using toliet, bedpan, or urinal?: None Help from another person bathing (including washing, rinsing, drying)?: A Little Help from another person to put on and taking off regular upper body clothing?: None Help from another person to put on and taking off regular lower body clothing?: A Little 6 Click Score: 22   End of Session Equipment Utilized During Treatment: Rolling walker (2 wheels) Nurse Communication: Mobility status  Activity Tolerance: Patient tolerated treatment well Patient left: in bed;with call bell/phone within reach;with family/visitor present  OT Visit Diagnosis: Unsteadiness on feet (R26.81)                Time: 9174-9149 OT Time Calculation (min): 25 min Charges:  OT General Charges $OT Visit: 1 Visit OT Evaluation $OT Eval Moderate Complexity: 1 Mod OT Treatments $Self Care/Home Management : 8-22 mins  11/30/2023  RP, OTR/L  Acute Rehabilitation Services  Office:  [phone]   Christie Fox 11/30/2023, 8:59 AM

## 2024-02-12 ENCOUNTER — Ambulatory Visit: Payer: Self-pay | Admitting: Orthopedic Surgery

## 2024-02-19 NOTE — Progress Notes (Signed)
 Anesthesia Review:  PCP: Elberta Cone LVO 02/04/24  Cardiologist :  PPM/ ICD: Device Orders: Rep Notified:  Chest x-ray : EKG : 07/19/23  Echo : Stress test: Cardiac Cath :   Activity level:  Sleep Study/ CPAP : Fasting Blood Sugar :      / Checks Blood Sugar -- times a day:    Blood Thinner/ Instructions /Last Dose: ASA / Instructions/ Last Dose :    02/04/23-hgba1c- 6.3 , cbc/diff and cmp

## 2024-02-19 NOTE — Patient Instructions (Signed)
 SURGICAL WAITING ROOM VISITATION  Patients having surgery or a procedure may have no more than 2 support people in the waiting area - these visitors may rotate.    Children ages 51 and under will not be able to visit patients in [facility] under most circumstances.   Visitors with respiratory illnesses are discouraged from visiting and should remain at home.  If the patient needs to stay at the hospital during part of their recovery, the visitor guidelines for inpatient rooms apply. Pre-op nurse will coordinate an appropriate time for 1 support person to accompany patient in pre-op.  This support person may not rotate.    Please refer to the [facility] website for the visitor guidelines for Inpatients (after your surgery is over and you are in a regular room).       Your procedure is scheduled on:   03/05/2024    Report to [facility] Main Entrance    Report to admitting a  0800 AM   Call this number if you have problems the morning of surgery [phone]   Do not eat food :After Midnight.   After Midnight you may have the following liquids until _ 0730_____ AM DAY OF SURGERY  Water Non-Citrus Juices (without pulp, NO RED-Apple, White grape, White cranberry) Black Coffee (NO MILK/CREAM OR CREAMERS, sugar ok)  Clear Tea (NO MILK/CREAM OR CREAMERS, sugar ok) regular and decaf                             Plain Jell-O (NO RED)                                           Fruit ices (not with fruit pulp, NO RED)                                     Popsicles (NO RED)                                                               Sports drinks like Gatorade (N       The day of surgery:  Drink ONE (1) Pre-Surgery Clear Ensure or G2 at 0730 AM the morning of surgery. Drink in one sitting. Do not sip.  This drink was given to you during your hospital  pre-op appointment visit. Nothing else to drink after completing the  Pre-Surgery Clear Ensure or G2.          If you  have questions, please contact your surgeons office.       Oral Hygiene is also important to reduce your risk of infection.                                    Remember - BRUSH YOUR TEETH THE MORNING OF SURGERY WITH YOUR REGULAR TOOTHPASTE  DENTURES WILL BE REMOVED PRIOR TO SURGERY PLEASE DO NOT APPLY Poly grip OR ADHESIVES!!!   Do NOT smoke after Midnight   Stop  all vitamins and herbal supplements 7 days before surgery.   Take these medicines the morning of surgery with A SIP OF WATER:  xanax , synthroid , lyrica    DO NOT TAKE ANY ORAL DIABETIC MEDICATIONS DAY OF YOUR SURGERY  Bring CPAP mask and tubing day of surgery.                              You may not have any metal on your body including hair pins, jewelry, and body piercing             Do not wear make-up, lotions, powders, perfumes/cologne, or deodorant  Do not wear nail polish including gel and S&S, artificial/acrylic nails, or any other type of covering on natural nails including finger and toenails. If you have artificial nails, gel coating, etc. that needs to be removed by a nail salon please have this removed prior to surgery or surgery may need to be canceled/ delayed if the surgeon/ anesthesia feels like they are unable to be safely monitored.   Do not shave  48 hours prior to surgery.               Men may shave face and neck.   Do not bring valuables to the hospital. [facility] IS NOT             RESPONSIBLE   FOR VALUABLES.   Contacts, glasses, dentures or bridgework may not be worn into surgery.   Bring small overnight bag day of surgery.   DO NOT BRING YOUR HOME MEDICATIONS TO THE HOSPITAL. PHARMACY WILL DISPENSE MEDICATIONS LISTED ON YOUR MEDICATION LIST TO YOU DURING YOUR ADMISSION IN THE HOSPITAL!    Patients discharged on the day of surgery will not be allowed to drive home.  Someone NEEDS to stay with you for the first 24 hours after anesthesia.   Special Instructions: Bring a copy of your  healthcare power of attorney and living will documents the day of surgery if you haven't scanned them before.              Please read over the following fact sheets you were given: IF YOU HAVE QUESTIONS ABOUT YOUR PRE-OP INSTRUCTIONS PLEASE CALL [phone].   If you received a COVID test during your pre-op visit  it is requested that you wear a mask when out in public, stay away from anyone that may not be feeling well and notify your surgeon if you develop symptoms. If you test positive for Covid or have been in contact with anyone that has tested positive in the last 10 days please notify you surgeon.      Pre-operative 4 CHG Bath Instructions   You can play a key role in reducing the risk of infection after surgery. Your skin needs to be as free of germs as possible. You can reduce the number of germs on your skin by washing with CHG (chlorhexidine  gluconate) soap before surgery. CHG is an antiseptic soap that kills germs and continues to kill germs even after washing.   DO NOT use if you have an allergy to chlorhexidine /CHG or antibacterial soaps. If your skin becomes reddened or irritated, stop using the CHG and notify one of our RNs at [phone].   Please shower with the CHG soap starting 4 days before surgery using the following schedule:     Please keep in mind the following:  DO NOT shave, including legs and underarms, starting  the day of your first shower.   You may shave your face at any point before/day of surgery.  Place clean sheets on your bed the day you start using CHG soap. Use a clean washcloth (not used since being washed) for each shower. DO NOT sleep with pets once you start using the CHG.   CHG Shower Instructions:  If you choose to wash your hair and private area, wash first with your normal shampoo/soap.  After you use shampoo/soap, rinse your hair and body thoroughly to remove shampoo/soap residue.  Turn the water OFF and apply about 3 tablespoons (45 ml) of  CHG soap to a CLEAN washcloth.  Apply CHG soap ONLY FROM YOUR NECK DOWN TO YOUR TOES (washing for 3-5 minutes)  DO NOT use CHG soap on face, private areas, open wounds, or sores.  Pay special attention to the area where your surgery is being performed.  If you are having back surgery, having someone wash your back for you may be helpful. Wait 2 minutes after CHG soap is applied, then you may rinse off the CHG soap.  Pat dry with a clean towel  Put on clean clothes/pajamas   If you choose to wear lotion, please use ONLY the CHG-compatible lotions on the back of this paper.     Additional instructions for the day of surgery: DO NOT APPLY any lotions, deodorants, cologne, or perfumes.   Put on clean/comfortable clothes.  Brush your teeth.  Ask your nurse before applying any prescription medications to the skin.      CHG Compatible Lotions   Aveeno Moisturizing lotion  Cetaphil Moisturizing Cream  Cetaphil Moisturizing Lotion  Clairol Herbal Essence Moisturizing Lotion, Dry Skin  Clairol Herbal Essence Moisturizing Lotion, Extra Dry Skin  Clairol Herbal Essence Moisturizing Lotion, Normal Skin  Curel Age Defying Therapeutic Moisturizing Lotion with Alpha Hydroxy  Curel Extreme Care Body Lotion  Curel Soothing Hands Moisturizing Hand Lotion  Curel Therapeutic Moisturizing Cream, Fragrance-Free  Curel Therapeutic Moisturizing Lotion, Fragrance-Free  Curel Therapeutic Moisturizing Lotion, Original Formula  Eucerin Daily Replenishing Lotion  Eucerin Dry Skin Therapy Plus Alpha Hydroxy Crme  Eucerin Dry Skin Therapy Plus Alpha Hydroxy Lotion  Eucerin Original Crme  Eucerin Original Lotion  Eucerin Plus Crme Eucerin Plus Lotion  Eucerin TriLipid Replenishing Lotion  Keri Anti-Bacterial Hand Lotion  Keri Deep Conditioning Original Lotion Dry Skin Formula Softly Scented  Keri Deep Conditioning Original Lotion, Fragrance Free Sensitive Skin Formula  Keri Lotion Fast Absorbing  Fragrance Free Sensitive Skin Formula  Keri Lotion Fast Absorbing Softly Scented Dry Skin Formula  Keri Original Lotion  Keri Skin Renewal Lotion Keri Silky Smooth Lotion  Keri Silky Smooth Sensitive Skin Lotion  Nivea Body Creamy Conditioning Oil  Nivea Body Extra Enriched Teacher, Adult Education Moisturizing Lotion Nivea Crme  Nivea Skin Firming Lotion  NutraDerm 30 Skin Lotion  NutraDerm Skin Lotion  NutraDerm Therapeutic Skin Cream  NutraDerm Therapeutic Skin Lotion  ProShield Protective Hand Cream  Provon moisturizing lotion

## 2024-02-21 ENCOUNTER — Other Ambulatory Visit: Payer: Self-pay

## 2024-02-21 ENCOUNTER — Encounter (HOSPITAL_COMMUNITY)
Admission: RE | Admit: 2024-02-21 | Discharge: 2024-02-21 | Disposition: A | Discharge status: Home / Self Care | Source: Ambulatory Visit | Attending: Specialist | Admitting: Specialist

## 2024-02-21 ENCOUNTER — Encounter (HOSPITAL_COMMUNITY): Payer: Self-pay

## 2024-02-21 VITALS — BP 141/83 | HR 70 | Temp 98.2°F | Resp 16 | Ht 62.25 in | Wt 217.0 lb

## 2024-02-21 DIAGNOSIS — Z01812 Encounter for preprocedural laboratory examination: Secondary | ICD-10-CM | POA: Diagnosis present

## 2024-02-21 DIAGNOSIS — E669 Obesity, unspecified: Secondary | ICD-10-CM | POA: Diagnosis not present

## 2024-02-21 DIAGNOSIS — Z981 Arthrodesis status: Secondary | ICD-10-CM | POA: Diagnosis not present

## 2024-02-21 DIAGNOSIS — E039 Hypothyroidism, unspecified: Secondary | ICD-10-CM | POA: Insufficient documentation

## 2024-02-21 DIAGNOSIS — Z9884 Bariatric surgery status: Secondary | ICD-10-CM | POA: Diagnosis not present

## 2024-02-21 DIAGNOSIS — Z853 Personal history of malignant neoplasm of breast: Secondary | ICD-10-CM | POA: Insufficient documentation

## 2024-02-21 DIAGNOSIS — I10 Essential (primary) hypertension: Secondary | ICD-10-CM | POA: Diagnosis not present

## 2024-02-21 DIAGNOSIS — M170 Bilateral primary osteoarthritis of knee: Secondary | ICD-10-CM | POA: Insufficient documentation

## 2024-02-21 DIAGNOSIS — R7303 Prediabetes: Secondary | ICD-10-CM | POA: Insufficient documentation

## 2024-02-21 DIAGNOSIS — Z923 Personal history of irradiation: Secondary | ICD-10-CM | POA: Diagnosis not present

## 2024-02-21 DIAGNOSIS — F419 Anxiety disorder, unspecified: Secondary | ICD-10-CM | POA: Insufficient documentation

## 2024-02-21 DIAGNOSIS — Z6839 Body mass index (BMI) 39.0-39.9, adult: Secondary | ICD-10-CM | POA: Insufficient documentation

## 2024-02-21 DIAGNOSIS — Z01818 Encounter for other preprocedural examination: Secondary | ICD-10-CM

## 2024-02-21 LAB — BASIC METABOLIC PANEL WITH GFR
Anion gap: 12 (ref 5–15)
BUN: 19 mg/dL (ref 8–23)
CO2: 28 mmol/L (ref 22–32)
Calcium: 9.8 mg/dL (ref 8.9–10.3)
Chloride: 99 mmol/L (ref 98–111)
Creatinine, Ser: 0.7 mg/dL (ref 0.44–1.00)
GFR, Estimated: >60 mL/min
Glucose, Bld: 119 mg/dL — ABNORMAL HIGH (ref 70–99)
Potassium: 3.5 mmol/L (ref 3.5–5.1)
Sodium: 139 mmol/L (ref 135–145)

## 2024-02-21 LAB — CBC
HCT: 42.9 % (ref 36.0–46.0)
Hemoglobin: 14 g/dL (ref 12.0–15.0)
MCH: 29.8 pg (ref 26.0–34.0)
MCHC: 32.6 g/dL (ref 30.0–36.0)
MCV: 91.3 fL (ref 80.0–100.0)
Platelets: 280 K/uL (ref 150–400)
RBC: 4.7 MIL/uL (ref 3.87–5.11)
RDW: 13.4 % (ref 11.5–15.5)
WBC: 6.2 K/uL (ref 4.0–10.5)
nRBC: 0 % (ref 0.0–0.2)

## 2024-02-21 LAB — SURGICAL PCR SCREEN
MRSA, PCR: NEGATIVE
Staphylococcus aureus: POSITIVE — AB

## 2024-02-25 NOTE — Progress Notes (Signed)
 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  Underwent ACDF in 10/2023. Only notable complications was extravasation of IV at end of case. 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  Patient had pre op exam with PCP on 02/04/24. All issues stable:  Preoperative clearance - Blood pressure readings are within the normal range. Weight loss has been achieved, which will aid in postoperative recovery. A1c levels have remained stable, indicating effective management of prediabetes. No surgical or anesthesia-related complications have been experienced in the past, and no other high-risk factors are present. - Comprehensive blood work will be conducted today. An EKG may be performed if required by the surgical team but patient defers for now. The necessary paperwork will be completed and faxed back to the surgical team upon receipt of the lab results. No clearance issues are anticipated at this time.   VS: BP (!) 141/83   Pulse 70   Temp 36.8 C (Oral)   Resp 16   Ht 5' 2.25 (1.581 m)   Wt 98.4 kg   SpO2 99%   BMI 39.37 kg/m   PROVIDERS: Jesus Elberta Gainer, FNP   LABS: Labs reviewed: Acceptable for surgery. (all labs ordered are listed, but only abnormal results are displayed)  Labs Reviewed  SURGICAL PCR SCREEN - Abnormal; Notable for the following components:      Result Value   Staphylococcus aureus POSITIVE (*)    All  other components within normal limits  BASIC METABOLIC PANEL WITH GFR - Abnormal; Notable for the following components:   Glucose, Bld 119 (*)    All other components within normal limits  CBC     EKG 07/19/23:  NSR  CV:  Past Medical History:  Diagnosis Date   Anxiety    Arthritis    At risk for sleep apnea    STOP-BANG= 4    SENT TO PCP 07-31-2013   Borderline diabetes    diet controlled   Breast cancer (HCC)    Dysuria    History of kidney stones    surgery   Hyperlipidemia    Hypertension    Hypothyroidism    PONV (postoperative nausea and vomiting)    please call me Christie Fox to wake me up , not Mrs Eskridge    Pre-diabetes    diet control - no meds   Urgency of urination    Walker as ambulation aid     Past Surgical History:  Procedure Laterality Date   ANTERIOR CERVICAL DECOMP/DISCECTOMY FUSION N/A 11/29/2023   Procedure: ANTERIOR CERVICAL DISCECTOMY/DECOMPRESSION, FUSION OF CERVICAL FOUR TO CERVICAL FIVE, CERVICAL FIVE TO CERVICAL SIX, AND CERVICAL SIX TO CERVICAL SEVEN;  Surgeon: Burnetta Aures, MD;  Location: [facility];  Service: Orthopedics;  Laterality: N/A;  C4-C7   BREAST CAPSULECTOMY WITH IMPLANT EXCHANGE Right 07/24/2023   Procedure: CAPSULECTOMY, BREAST, WITH REPLACEMENT OF IMPLANT;  Surgeon: Arelia Filippo, MD;  Location: [facility];  Service: Plastics;  Laterality:  Right;   BREAST ENHANCEMENT SURGERY Left 07/24/2023   Procedure: AUGMENTATION, BREAST;  Surgeon: Arelia Filippo, MD;  Location: [facility];  Service: Plastics;  Laterality: Left;   BREAST LUMPECTOMY WITH RADIOACTIVE SEED LOCALIZATION Right 02/27/2022   Procedure: RIGHT BREAST LUMPECTOMY WITH RADIOACTIVE SEED LOCALIZATION X3;  Surgeon: Ebbie Cough, MD;  Location: [facility];  Service: General;  Laterality: Right;   CESAREAN SECTION  x2   CYSTOSCOPY WITH RETROGRADE PYELOGRAM, URETEROSCOPY AND STENT PLACEMENT Left 08/04/2013   Procedure: 1ST  STAGE CYSTOSCOPY WITH RETROGRADE PYELOGRAM, URETEROSCOPY AND STENT PLACEMENT;  Surgeon: Ricardo Likens, MD;  Location[facility];  Service: Urology;  Laterality: Left;   CYSTOSCOPY WITH RETROGRADE PYELOGRAM, URETEROSCOPY AND STENT PLACEMENT Left 08/20/2013   Procedure: 2ND STAGE CYSTOSCOPY WITH RETROGRADE PYELOGRAM, URETEROSCOPY BASKET STONES AND STENT EXCHANGE, BLOOD MOP;  Surgeon: Ricardo Likens, MD;  Location: [facility];  Service: Urology;  Laterality: Left;   EXPLORATORY LAPAROTOMY W/ BILATERAL SALPINOOPHORECTOMY  11/27/2001   EYE SURGERY Bilateral    cataracts   HOLMIUM LASER APPLICATION Left 08/04/2013   Procedure: HOLMIUM LASER APPLICATION;  Surgeon: Ricardo Likens, MD;  Location[facility];  Service: Urology;  Laterality: Left;   LAPAROSCOPIC GASTRIC SLEEVE RESECTION N/A 03/27/2017   Procedure: LAPAROSCOPIC GASTRIC SLEEVE RESECTION WITH UPPER ENDO;  Surgeon: Ethyl Lenis, MD;  Location[facility];  Service: General;  Laterality: N/A;   LEFT URETEROSCOPIC LASER LITHO STONE EXTRACTION AND STENT PLACEMENT  03/25/2005   LIPOSUCTION Bilateral 07/24/2023   Procedure: LIPOSUCTION;  Surgeon: Arelia Filippo, MD;  Location: [facility];  Service: Plastics;  Laterality: Bilateral;  LIPO BILATERAL LATERAL CHEST WALL   LUMBAR FUSION  X2   LAST ONE 2000   MASTOPEXY Bilateral 12/25/2022   Procedure: MASTOPEXY;  Surgeon: Arelia Filippo, MD;  Location: [facility];  Service: Plastics;  Laterality: Bilateral;   ORIF LEFT ANKLE FX  03/20/2000   RETAINED HARDWARE   RADIOACTIVE SEED GUIDED EXCISIONAL BREAST BIOPSY Left 02/27/2022   Procedure: RADIOACTIVE SEED GUIDED EXCISIONAL LEFT BREAST BIOPSY;  Surgeon: Ebbie Cough, MD;  Location: [facility];  Service: General;  Laterality: Left;   VAGINAL HYSTERECTOMY  01/30/1997   WISDOM TOOTH EXTRACTION      MEDICATIONS:  ALPRAZolam  (XANAX ) 1 MG tablet   furosemide  (LASIX ) 40 MG tablet    levothyroxine  (SYNTHROID , LEVOTHROID) 100 MCG tablet   ondansetron  (ZOFRAN ) 4 MG tablet   perindopril (ACEON) 4 MG tablet   pregabalin  (LYRICA ) 150 MG capsule   rosuvastatin  (CRESTOR ) 20 MG tablet   tiZANidine (ZANAFLEX) 4 MG tablet   No current facility-administered medications for this encounter.   Burnard CHRISTELLA Odis DEVONNA [facility] Short Stay/Anesthesiology [facility] Phone [phone] 02/25/2024 8:48 AM        "

## 2024-02-25 NOTE — Anesthesia Preprocedure Evaluation (Addendum)
 "                                  Anesthesia Evaluation  Patient identified by MRN, date of birth, ID band Patient awake    Reviewed: Allergy & Precautions, NPO status , Patient's Chart, lab work & pertinent test results, reviewed documented beta blocker date and time   History of Anesthesia Complications (+) PONV and history of anesthetic complications  Airway Mallampati: II  TM Distance: >3 FB     Dental no notable dental hx. (+) Teeth Intact, Dental Advisory Given, Caps   Pulmonary neg pulmonary ROS   Pulmonary exam normal breath sounds clear to auscultation       Cardiovascular hypertension, Pt. on medications Normal cardiovascular exam Rhythm:Regular Rate:Normal  EKG 07/19/23 NSR, Normal   Neuro/Psych   Anxiety     negative neurological ROS     GI/Hepatic negative GI ROS, Neg liver ROS,,,  Endo/Other  Hypothyroidism  HLD Borderline DM Hx/o right breast Ca S/P Lumpectomy + ChemoRx & RT  Renal/GU negative Renal ROSHx/o nephrolithiasis  Lab Results      Component                Value               Date                      NA                       139                 02/21/2024                CL                       99                  02/21/2024                K                        3.5                 02/21/2024                CO2                      28                  02/21/2024                BUN                      19                  02/21/2024                CREATININE               0.70                02/21/2024                GFRNONAA                 >  60                 02/21/2024                CALCIUM                   9.8                 02/21/2024                ALBUMIN                  4.1                 01/04/2022                GLUCOSE                  119 (H)             02/21/2024             negative genitourinary   Musculoskeletal  (+) Arthritis , Osteoarthritis,  S/P cervical fusion Hx/o lumbar surgery   Abdominal   (+) + obese  Peds  Hematology negative hematology ROS (+) Lab Results      Component                Value               Date                      WBC                      6.2                 02/21/2024                HGB                      14.0                02/21/2024                HCT                      42.9                02/21/2024                MCV                      91.3                02/21/2024                PLT                      280                 02/21/2024           d   Anesthesia Other Findings   Reproductive/Obstetrics                              Anesthesia Physical Anesthesia Plan  ASA: 2  Anesthesia Plan: Spinal   Post-op Pain Management: Regional block* and Minimal or no  pain anticipated   Induction: Intravenous  PONV Risk Score and Plan: 4 or greater and Treatment may vary due to age or medical condition, Propofol  infusion, Ondansetron  and Dexamethasone   Airway Management Planned: Natural Airway and Simple Face Mask  Additional Equipment: None  Intra-op Plan:   Post-operative Plan:   Informed Consent: I have reviewed the patients History and Physical, chart, labs and discussed the procedure including the risks, benefits and alternatives for the proposed anesthesia with the patient or authorized representative who has indicated his/her understanding and acceptance.     Dental advisory given  Plan Discussed with: Anesthesiologist and CRNA  Anesthesia Plan Comments: (See PAT note from 1/22)         Anesthesia Quick Evaluation  "

## 2024-03-04 ENCOUNTER — Encounter (HOSPITAL_COMMUNITY): Payer: Self-pay | Admitting: Specialist

## 2024-03-05 ENCOUNTER — Ambulatory Visit (HOSPITAL_COMMUNITY)

## 2024-03-05 ENCOUNTER — Other Ambulatory Visit: Payer: Self-pay

## 2024-03-05 ENCOUNTER — Encounter (HOSPITAL_COMMUNITY): Admitting: Anesthesiology

## 2024-03-05 ENCOUNTER — Ambulatory Visit (HOSPITAL_COMMUNITY)
Admission: RE | Admit: 2024-03-05 | Discharge: 2024-03-06 | Disposition: A | Source: Ambulatory Visit | Attending: Specialist | Admitting: Specialist

## 2024-03-05 ENCOUNTER — Encounter (HOSPITAL_COMMUNITY): Admission: RE | Disposition: A | Payer: Self-pay | Source: Ambulatory Visit | Attending: Specialist

## 2024-03-05 ENCOUNTER — Ambulatory Visit (HOSPITAL_COMMUNITY): Payer: Self-pay | Admitting: Medical

## 2024-03-05 DIAGNOSIS — M21162 Varus deformity, not elsewhere classified, left knee: Secondary | ICD-10-CM | POA: Insufficient documentation

## 2024-03-05 DIAGNOSIS — E039 Hypothyroidism, unspecified: Secondary | ICD-10-CM | POA: Insufficient documentation

## 2024-03-05 DIAGNOSIS — Z981 Arthrodesis status: Secondary | ICD-10-CM | POA: Insufficient documentation

## 2024-03-05 DIAGNOSIS — I10 Essential (primary) hypertension: Secondary | ICD-10-CM | POA: Insufficient documentation

## 2024-03-05 DIAGNOSIS — M1712 Unilateral primary osteoarthritis, left knee: Secondary | ICD-10-CM | POA: Diagnosis present

## 2024-03-05 DIAGNOSIS — E669 Obesity, unspecified: Secondary | ICD-10-CM | POA: Insufficient documentation

## 2024-03-05 DIAGNOSIS — E785 Hyperlipidemia, unspecified: Secondary | ICD-10-CM | POA: Insufficient documentation

## 2024-03-05 DIAGNOSIS — F419 Anxiety disorder, unspecified: Secondary | ICD-10-CM | POA: Insufficient documentation

## 2024-03-05 DIAGNOSIS — R7303 Prediabetes: Secondary | ICD-10-CM | POA: Insufficient documentation

## 2024-03-05 DIAGNOSIS — Z01818 Encounter for other preprocedural examination: Secondary | ICD-10-CM

## 2024-03-05 DIAGNOSIS — Z6839 Body mass index (BMI) 39.0-39.9, adult: Secondary | ICD-10-CM | POA: Insufficient documentation

## 2024-03-05 DIAGNOSIS — Z853 Personal history of malignant neoplasm of breast: Secondary | ICD-10-CM | POA: Insufficient documentation

## 2024-03-05 MED ORDER — FENTANYL CITRATE (PF) 100 MCG/2ML IJ SOLN
INTRAMUSCULAR | Status: AC
  Filled 2024-03-05: qty 2

## 2024-03-05 MED ORDER — SODIUM CHLORIDE 0.9 % IR SOLN
Status: DC | PRN
  Administered 2024-03-05: 3000 mL

## 2024-03-05 MED ORDER — PROPOFOL 10 MG/ML IV BOLUS
INTRAVENOUS | Status: AC
  Filled 2024-03-05: qty 20

## 2024-03-05 MED ORDER — STERILE WATER FOR IRRIGATION IR SOLN
Status: DC | PRN
  Administered 2024-03-05: 2000 mL

## 2024-03-05 MED ORDER — PROPOFOL 10 MG/ML IV BOLUS
INTRAVENOUS | Status: DC | PRN
  Administered 2024-03-05 (×3): 20 mg via INTRAVENOUS
  Administered 2024-03-05: 40 mg via INTRAVENOUS

## 2024-03-05 MED ORDER — MAGNESIUM CITRATE PO SOLN: 1.0000 | Freq: Once | ORAL | Status: DC | PRN

## 2024-03-05 MED ORDER — ASPIRIN 81 MG PO CHEW: 81.0000 mg | CHEWABLE_TABLET | Freq: Two times a day (BID) | ORAL | Status: DC

## 2024-03-05 MED ORDER — DROPERIDOL 2.5 MG/ML IJ SOLN: 0.6250 mg | Freq: Once | INTRAMUSCULAR | Status: DC | PRN

## 2024-03-05 MED ORDER — TIZANIDINE HCL 4 MG PO TABS
4.0000 mg | ORAL_TABLET | Freq: Every day | ORAL | Status: DC
  Administered 2024-03-05: 4 mg via ORAL
  Filled 2024-03-05: qty 1

## 2024-03-05 MED ORDER — SENNA 8.6 MG PO TABS
1.0000 | ORAL_TABLET | Freq: Every day | ORAL | Status: DC
  Administered 2024-03-05 – 2024-03-06 (×2): 8.6 mg via ORAL
  Filled 2024-03-05 (×2): qty 1

## 2024-03-05 MED ORDER — OXYCODONE HCL 5 MG PO TABS
5.0000 mg | ORAL_TABLET | ORAL | Status: DC | PRN
  Filled 2024-03-05: qty 2

## 2024-03-05 MED ORDER — ONDANSETRON HCL 4 MG/2ML IJ SOLN: 4.0000 mg | Freq: Four times a day (QID) | INTRAMUSCULAR | Status: DC | PRN

## 2024-03-05 MED ORDER — ROPIVACAINE HCL 5 MG/ML IJ SOLN
INTRAMUSCULAR | Status: DC | PRN
  Administered 2024-03-05: 30 mL via PERINEURAL

## 2024-03-05 MED ORDER — FENTANYL CITRATE (PF) 50 MCG/ML IJ SOSY: 50.0000 ug | PREFILLED_SYRINGE | INTRAMUSCULAR | Status: DC

## 2024-03-05 MED ORDER — KCL IN DEXTROSE-NACL 20-5-0.9 MEQ/L-%-% IV SOLN
INTRAVENOUS | Status: DC
  Filled 2024-03-05 (×3): qty 1000

## 2024-03-05 MED ORDER — ORAL CARE MOUTH RINSE: 15.0000 mL | Freq: Once | OROMUCOSAL | Status: AC

## 2024-03-05 MED ORDER — TRANDOLAPRIL 2 MG PO TABS
2.0000 mg | ORAL_TABLET | Freq: Every day | ORAL | Status: DC
  Administered 2024-03-06: 2 mg via ORAL
  Filled 2024-03-05: qty 1

## 2024-03-05 MED ORDER — DEXAMETHASONE SOD PHOSPHATE PF 10 MG/ML IJ SOLN
INTRAMUSCULAR | Status: DC | PRN
  Administered 2024-03-05: 5 mg via INTRAVENOUS

## 2024-03-05 MED ORDER — BUPIVACAINE-EPINEPHRINE 0.25% -1:200000 IJ SOLN
INTRAMUSCULAR | Status: DC | PRN
  Administered 2024-03-05: 30 mL

## 2024-03-05 MED ORDER — SODIUM CHLORIDE (PF) 0.9 % IJ SOLN
INTRAMUSCULAR | Status: AC
  Filled 2024-03-05: qty 50

## 2024-03-05 MED ORDER — BUPIVACAINE-EPINEPHRINE (PF) 0.25% -1:200000 IJ SOLN
INTRAMUSCULAR | Status: AC
  Filled 2024-03-05: qty 30

## 2024-03-05 MED ORDER — METHOCARBAMOL 1000 MG/10ML IJ SOLN: 500.0000 mg | Freq: Four times a day (QID) | INTRAMUSCULAR | Status: DC | PRN

## 2024-03-05 MED ORDER — OXYCODONE HCL 5 MG/5ML PO SOLN: 5.0000 mg | Freq: Once | ORAL | Status: DC | PRN

## 2024-03-05 MED ORDER — PROPOFOL 1000 MG/100ML IV EMUL
INTRAVENOUS | Status: AC
  Filled 2024-03-05: qty 100

## 2024-03-05 MED ORDER — METOCLOPRAMIDE HCL 5 MG/ML IJ SOLN: 5.0000 mg | Freq: Three times a day (TID) | INTRAMUSCULAR | Status: DC | PRN

## 2024-03-05 MED ORDER — ACETAMINOPHEN 500 MG PO TABS
1000.0000 mg | ORAL_TABLET | Freq: Four times a day (QID) | ORAL | Status: DC
  Administered 2024-03-05 – 2024-03-06 (×3): 1000 mg via ORAL
  Filled 2024-03-05 (×4): qty 2

## 2024-03-05 MED ORDER — OXYCODONE HCL 5 MG PO TABS: 5.0000 mg | ORAL_TABLET | Freq: Once | ORAL | Status: DC | PRN

## 2024-03-05 MED ORDER — LEVOTHYROXINE SODIUM 100 MCG PO TABS
100.0000 ug | ORAL_TABLET | Freq: Every day | ORAL | Status: DC
  Administered 2024-03-06: 100 ug via ORAL
  Filled 2024-03-05: qty 1

## 2024-03-05 MED ORDER — ONDANSETRON HCL 4 MG PO TABS: 4.0000 mg | ORAL_TABLET | Freq: Three times a day (TID) | ORAL | Status: DC | PRN

## 2024-03-05 MED ORDER — HYDROMORPHONE HCL 1 MG/ML IJ SOLN
INTRAMUSCULAR | Status: AC
  Filled 2024-03-05: qty 2

## 2024-03-05 MED ORDER — CEFAZOLIN SODIUM-DEXTROSE 2-4 GM/100ML-% IV SOLN
2.0000 g | Freq: Four times a day (QID) | INTRAVENOUS | Status: AC
  Administered 2024-03-05 (×2): 2 g via INTRAVENOUS
  Filled 2024-03-05 (×2): qty 100

## 2024-03-05 MED ORDER — ALUM & MAG HYDROXIDE-SIMETH 200-200-20 MG/5ML PO SUSP
30.0000 mL | ORAL | Status: DC | PRN
  Administered 2024-03-06: 30 mL via ORAL
  Filled 2024-03-05: qty 30

## 2024-03-05 MED ORDER — MIDAZOLAM HCL 2 MG/2ML IJ SOLN
INTRAMUSCULAR | Status: AC
  Filled 2024-03-05: qty 2

## 2024-03-05 MED ORDER — PREGABALIN 75 MG PO CAPS
150.0000 mg | ORAL_CAPSULE | Freq: Three times a day (TID) | ORAL | Status: DC
  Administered 2024-03-05 – 2024-03-06 (×3): 150 mg via ORAL
  Filled 2024-03-05 (×3): qty 2

## 2024-03-05 MED ORDER — ACETAMINOPHEN 10 MG/ML IV SOLN
1000.0000 mg | INTRAVENOUS | Status: AC
  Administered 2024-03-05: 1000 mg via INTRAVENOUS
  Filled 2024-03-05: qty 100

## 2024-03-05 MED ORDER — 0.9 % SODIUM CHLORIDE (POUR BTL) OPTIME
TOPICAL | Status: DC | PRN
  Administered 2024-03-05: 1000 mL

## 2024-03-05 MED ORDER — FUROSEMIDE 40 MG PO TABS
40.0000 mg | ORAL_TABLET | Freq: Two times a day (BID) | ORAL | Status: DC
  Administered 2024-03-05 – 2024-03-06 (×2): 40 mg via ORAL
  Filled 2024-03-05 (×2): qty 1

## 2024-03-05 MED ORDER — BISACODYL 5 MG PO TBEC: 5.0000 mg | DELAYED_RELEASE_TABLET | Freq: Every day | ORAL | Status: DC | PRN

## 2024-03-05 MED ORDER — MENTHOL 3 MG MT LOZG: 1.0000 | LOZENGE | OROMUCOSAL | Status: DC | PRN

## 2024-03-05 MED ORDER — METHOCARBAMOL 500 MG PO TABS
500.0000 mg | ORAL_TABLET | Freq: Four times a day (QID) | ORAL | Status: DC | PRN
  Administered 2024-03-05 – 2024-03-06 (×3): 500 mg via ORAL
  Filled 2024-03-05 (×4): qty 1

## 2024-03-05 MED ORDER — MIDAZOLAM HCL (PF) 2 MG/2ML IJ SOLN
1.0000 mg | INTRAMUSCULAR | Status: DC
  Administered 2024-03-05: 2 mg via INTRAVENOUS

## 2024-03-05 MED ORDER — METOCLOPRAMIDE HCL 5 MG PO TABS: 5.0000 mg | ORAL_TABLET | Freq: Three times a day (TID) | ORAL | Status: DC | PRN

## 2024-03-05 MED ORDER — FENTANYL CITRATE (PF) 100 MCG/2ML IJ SOLN
INTRAMUSCULAR | Status: DC | PRN
  Administered 2024-03-05 (×2): 50 ug via INTRAVENOUS

## 2024-03-05 MED ORDER — LACTATED RINGERS IV SOLN: INTRAVENOUS | Status: DC

## 2024-03-05 MED ORDER — RISAQUAD PO CAPS
1.0000 | ORAL_CAPSULE | Freq: Every day | ORAL | Status: DC
  Administered 2024-03-05 – 2024-03-06 (×2): 1 via ORAL
  Filled 2024-03-05 (×2): qty 1

## 2024-03-05 MED ORDER — OXYCODONE HCL 5 MG PO TABS
10.0000 mg | ORAL_TABLET | ORAL | Status: DC | PRN
  Administered 2024-03-05 – 2024-03-06 (×5): 10 mg via ORAL
  Filled 2024-03-05 (×4): qty 2

## 2024-03-05 MED ORDER — SODIUM CHLORIDE (PF) 0.9 % IJ SOLN
INTRAMUSCULAR | Status: DC | PRN
  Administered 2024-03-05: 60 mL

## 2024-03-05 MED ORDER — ONDANSETRON HCL 4 MG/2ML IJ SOLN
INTRAMUSCULAR | Status: DC | PRN
  Administered 2024-03-05: 4 mg via INTRAVENOUS

## 2024-03-05 MED ORDER — ROSUVASTATIN CALCIUM 20 MG PO TABS
20.0000 mg | ORAL_TABLET | Freq: Every day | ORAL | Status: DC
  Administered 2024-03-06: 20 mg via ORAL
  Filled 2024-03-05: qty 1

## 2024-03-05 MED ORDER — ONDANSETRON HCL 4 MG/2ML IJ SOLN: 4.0000 mg | Freq: Once | INTRAMUSCULAR | Status: DC | PRN

## 2024-03-05 MED ORDER — TRANEXAMIC ACID-NACL 1000-0.7 MG/100ML-% IV SOLN
1000.0000 mg | INTRAVENOUS | Status: AC
  Administered 2024-03-05: 1000 mg via INTRAVENOUS
  Filled 2024-03-05: qty 100

## 2024-03-05 MED ORDER — ONDANSETRON HCL 4 MG PO TABS: 4.0000 mg | ORAL_TABLET | Freq: Four times a day (QID) | ORAL | Status: DC | PRN

## 2024-03-05 MED ORDER — BUPIVACAINE IN DEXTROSE 0.75-8.25 % IT SOLN
INTRATHECAL | Status: DC | PRN
  Administered 2024-03-05: 1.8 mL via INTRATHECAL

## 2024-03-05 MED ORDER — BUPIVACAINE LIPOSOME 1.3 % IJ SUSP
INTRAMUSCULAR | Status: AC
  Filled 2024-03-05: qty 20

## 2024-03-05 MED ORDER — CEFAZOLIN SODIUM-DEXTROSE 2-4 GM/100ML-% IV SOLN
2.0000 g | INTRAVENOUS | Status: AC
  Administered 2024-03-05: 2 g via INTRAVENOUS
  Filled 2024-03-05: qty 100

## 2024-03-05 MED ORDER — POLYETHYLENE GLYCOL 3350 17 G PO PACK
17.0000 g | PACK | Freq: Every day | ORAL | Status: DC
  Filled 2024-03-05: qty 1

## 2024-03-05 MED ORDER — DIPHENHYDRAMINE HCL 12.5 MG/5ML PO ELIX: 12.5000 mg | ORAL_SOLUTION | ORAL | Status: DC | PRN

## 2024-03-05 MED ORDER — CHLORHEXIDINE GLUCONATE 0.12 % MT SOLN
15.0000 mL | Freq: Once | OROMUCOSAL | Status: AC
  Administered 2024-03-05: 15 mL via OROMUCOSAL

## 2024-03-05 MED ORDER — PROPOFOL 500 MG/50ML IV EMUL
INTRAVENOUS | Status: DC | PRN
  Administered 2024-03-05: 150 ug/kg/min via INTRAVENOUS

## 2024-03-05 MED ORDER — PHENYLEPHRINE HCL-NACL 20-0.9 MG/250ML-% IV SOLN
INTRAVENOUS | Status: DC | PRN
  Administered 2024-03-05: 40 ug/min via INTRAVENOUS

## 2024-03-05 MED ORDER — ALPRAZOLAM 0.5 MG PO TABS
1.0000 mg | ORAL_TABLET | Freq: Three times a day (TID) | ORAL | Status: DC
  Administered 2024-03-05 – 2024-03-06 (×3): 1 mg via ORAL
  Filled 2024-03-05 (×3): qty 2

## 2024-03-05 MED ORDER — HYDROMORPHONE HCL 1 MG/ML IJ SOLN
0.2500 mg | INTRAMUSCULAR | Status: DC | PRN
  Administered 2024-03-05 (×3): 0.5 mg via INTRAVENOUS

## 2024-03-05 MED ORDER — PHENOL 1.4 % MT LIQD: 1.0000 | OROMUCOSAL | Status: DC | PRN

## 2024-03-05 MED ORDER — HYDROMORPHONE HCL 1 MG/ML IJ SOLN
0.5000 mg | INTRAMUSCULAR | Status: DC | PRN
  Administered 2024-03-05: 1 mg via INTRAVENOUS
  Filled 2024-03-05: qty 1

## 2024-03-05 NOTE — Discharge Instructions (Signed)

## 2024-03-05 NOTE — H&P (Signed)
 TOTAL KNEE ADMISSION H&P  Patient is being admitted for left total knee arthroplasty.  Subjective:  Chief Complaint:left knee pain.  HPI: NATAHSA Fox, 69 y.o. female, has a history of pain and functional disability in the left knee due to arthritis and has failed non-surgical conservative treatments for greater than 12 weeks to includeNSAID's and/or analgesics, corticosteriod injections, and supervised PT with diminished ADL's post treatment.  Onset of symptoms was gradual, starting 5 years ago with rapidlly worsening course since that time. The patient noted no past surgery on the left knee(s).  Patient currently rates pain in the left knee(s) at 8 out of 10 with activity. Patient has pain that interferes with activities of daily living.  Patient has evidence of subchondral cysts, periarticular osteophytes, and joint space narrowing by imaging studies. This patient has had none. There is no active infection.  Patient Active Problem List   Diagnosis Date Noted   Fusion of spine, cervical region 11/29/2023   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) 12/30/2021   Hyperlipidemia 08/19/2014   Hypertension 08/19/2014   Hypothyroidism 08/19/2014   Primary osteoarthritis of both knees 08/19/2014   Past Medical History:  Diagnosis Date   Anxiety    Arthritis    At risk for sleep apnea    STOP-BANG= 4    SENT TO PCP 07-31-2013   Borderline diabetes    diet controlled   Breast cancer (HCC)    Dysuria    History of kidney stones    surgery   Hyperlipidemia    Hypertension    Hypothyroidism    PONV (postoperative nausea and vomiting)    please call me Nakira to wake me up , not Mrs Olberding    Pre-diabetes    diet control - no meds   Urgency of urination    Walker as ambulation aid     Past Surgical History:  Procedure Laterality Date   ANTERIOR CERVICAL DECOMP/DISCECTOMY FUSION N/A 11/29/2023   Procedure: ANTERIOR CERVICAL DISCECTOMY/DECOMPRESSION, FUSION  OF CERVICAL FOUR TO CERVICAL FIVE, CERVICAL FIVE TO CERVICAL SIX, AND CERVICAL SIX TO CERVICAL SEVEN;  Surgeon: Burnetta Aures, MD;  Location: [facility];  Service: Orthopedics;  Laterality: N/A;  C4-C7   BREAST CAPSULECTOMY WITH IMPLANT EXCHANGE Right 07/24/2023   Procedure: CAPSULECTOMY, BREAST, WITH REPLACEMENT OF IMPLANT;  Surgeon: Arelia Filippo, MD;  Location: [facility];  Service: Plastics;  Laterality: Right;   BREAST ENHANCEMENT SURGERY Left 07/24/2023   Procedure: AUGMENTATION, BREAST;  Surgeon: Arelia Filippo, MD;  Location: [facility];  Service: Plastics;  Laterality: Left;   BREAST LUMPECTOMY WITH RADIOACTIVE SEED LOCALIZATION Right 02/27/2022   Procedure: RIGHT BREAST LUMPECTOMY WITH RADIOACTIVE SEED LOCALIZATION X3;  Surgeon: Ebbie Cough, MD;  Location: [facility];  Service: General;  Laterality: Right;   CESAREAN SECTION  x2   CYSTOSCOPY WITH RETROGRADE PYELOGRAM, URETEROSCOPY AND STENT PLACEMENT Left 08/04/2013   Procedure: 1ST STAGE CYSTOSCOPY WITH RETROGRADE PYELOGRAM, URETEROSCOPY AND STENT PLACEMENT;  Surgeon: Ricardo Likens, MD;  Location: [facility];  Service: Urology;  Laterality: Left;   CYSTOSCOPY WITH RETROGRADE PYELOGRAM, URETEROSCOPY AND STENT PLACEMENT Left 08/20/2013   Procedure: 2ND STAGE CYSTOSCOPY WITH RETROGRADE PYELOGRAM, URETEROSCOPY BASKET STONES AND STENT EXCHANGE, BLOOD MOP;  Surgeon: Ricardo Likens, MD;  Location: [facility];  Service: Urology;  Laterality: Left;   EXPLORATORY LAPAROTOMY W/ BILATERAL SALPINOOPHORECTOMY  11/27/2001   EYE SURGERY Bilateral    cataracts   HOLMIUM LASER APPLICATION Left  08/04/2013   Procedure: HOLMIUM LASER APPLICATION;  Surgeon: Ricardo Likens, MD;  Location: [facility];  Service: Urology;  Laterality: Left;   LAPAROSCOPIC GASTRIC SLEEVE RESECTION N/A 03/27/2017   Procedure: LAPAROSCOPIC GASTRIC SLEEVE RESECTION WITH UPPER ENDO;  Surgeon: Ethyl Lenis, MD;  Location:  [facility];  Service: General;  Laterality: N/A;   LEFT URETEROSCOPIC LASER LITHO STONE EXTRACTION AND STENT PLACEMENT  03/25/2005   LIPOSUCTION Bilateral 07/24/2023   Procedure: LIPOSUCTION;  Surgeon: Arelia Filippo, MD;  Location: [facility];  Service: Plastics;  Laterality: Bilateral;  LIPO BILATERAL LATERAL CHEST WALL   LUMBAR FUSION  X2   LAST ONE 2000   MASTOPEXY Bilateral 12/25/2022   Procedure: MASTOPEXY;  Surgeon: Arelia Filippo, MD;  Location: [facility];  Service: Plastics;  Laterality: Bilateral;   ORIF LEFT ANKLE FX  03/20/2000   RETAINED HARDWARE   RADIOACTIVE SEED GUIDED EXCISIONAL BREAST BIOPSY Left 02/27/2022   Procedure: RADIOACTIVE SEED GUIDED EXCISIONAL LEFT BREAST BIOPSY;  Surgeon: Ebbie Cough, MD;  Location: [facility];  Service: General;  Laterality: Left;   VAGINAL HYSTERECTOMY  01/30/1997   WISDOM TOOTH EXTRACTION      Current Facility-Administered Medications  Medication Dose Route Frequency Provider Last Rate Last Admin   acetaminophen  (OFIRMEV ) IV 1,000 mg  1,000 mg Intravenous On Call to OR Bissell, Jaclyn M, PA-C       ceFAZolin  (ANCEF ) IVPB 2g/100 mL premix  2 g Intravenous On Call to OR Bissell, Jaclyn M, PA-C       fentaNYL  (SUBLIMAZE ) injection 50-100 mcg  50-100 mcg Intravenous UD Jerrye Sharper, MD       lactated ringers  infusion   Intravenous Continuous Bissell, Jaclyn M, PA-C       lactated ringers  infusion   Intravenous Continuous Jefm Garnette LABOR, MD 10 mL/hr at 03/05/24 0659 New Bag at 03/05/24 0659   midazolam  PF (VERSED ) injection 1-2 mg  1-2 mg Intravenous VICENTE Jerrye Sharper, MD       tranexamic acid  (CYKLOKAPRON ) IVPB 1,000 mg  1,000 mg Intravenous To OR Bissell, Jaclyn M, PA-C       Facility-Administered Medications Ordered in Other Encounters  Medication Dose Route Frequency Provider Last Rate Last Admin   fentaNYL  (SUBLIMAZE ) injection   Intravenous Anesthesia Intra-op Franchot Delon RAMAN,  CRNA   50 mcg at 03/05/24 0816   propofol  (DIPRIVAN ) 10 mg/mL bolus/IV push   Intravenous Anesthesia Intra-op Carter, Jennifer S, CRNA   20 mg at 03/05/24 9182   Allergies[1]  Social History   Tobacco Use   Smoking status: Never   Smokeless tobacco: Never  Substance Use Topics   Alcohol use: Yes    Alcohol/week: 2.0 standard drinks of alcohol    Types: 2 Glasses of wine per week    Comment: wine socially    Family History  Problem Relation Age of Onset   Breast cancer Mother 56     Review of Systems  Musculoskeletal:  Positive for arthralgias, joint swelling and myalgias.  All other systems reviewed and are negative.   Objective:  Physical Exam HENT:     Head: Normocephalic.  Eyes:     Pupils: Pupils are equal, round, and reactive to light.  Cardiovascular:     Rate and Rhythm: Normal rate.     Pulses: Normal pulses.  Pulmonary:     Effort: Pulmonary effort is normal.  Abdominal:     Palpations: Abdomen is soft.  Musculoskeletal:  General: Swelling, tenderness and deformity present.     Cervical back: Normal range of motion.     Comments: Tender medial joint line.  Patellofemoral pain compression ranges -5-100.  Varus deformity no DVT ipsilateral hip and ankle exam is unremarkable  Skin:    General: Skin is warm.  Neurological:     General: No focal deficit present.     Mental Status: She is alert.  Psychiatric:        Mood and Affect: Mood normal.     Vital signs in last 24 hours: Temp:  [97.7 F (36.5 C)] 97.7 F (36.5 C) (02/04 0736) Pulse Rate:  [78] 78 (02/04 0736) Resp:  [16] 16 (02/04 0736) BP: (119)/(69) 119/69 (02/04 0736) SpO2:  [94 %] 94 % (02/04 0736)  Labs:   Estimated body mass index is 39.37 kg/m as calculated from the following:   Height as of 02/21/24: 5' 2.25 (1.581 m).   Weight as of 02/21/24: 98.4 kg.   Imaging Review Plain radiographs demonstrate severe degenerative joint disease of the left knee(s). The overall  alignment ismild varus. The bone quality appears to be good for age and reported activity level.      Assessment/Plan:  End stage arthritis, left knee   The patient history, physical examination, clinical judgment of the provider and imaging studies are consistent with end stage degenerative joint disease of the left knee(s) and total knee arthroplasty is deemed medically necessary. The treatment options including medical management, injection therapy arthroscopy and arthroplasty were discussed at length. The risks and benefits of total knee arthroplasty were presented and reviewed. The risks due to aseptic loosening, infection, stiffness, patella tracking problems, thromboembolic complications and other imponderables were discussed. The patient acknowledged the explanation, agreed to proceed with the plan and consent was signed. Patient is being admitted for inpatient treatment for surgery, pain control, PT, OT, prophylactic antibiotics, VTE prophylaxis, progressive ambulation and ADL's and discharge planning. The patient is planning to be discharged home with home health services     Patient's anticipated LOS is less than 2 midnights, meeting these requirements: - Younger than 31 - Lives within 1 hour of care - Has a competent adult at home to recover with post-op recover - NO history of  - Chronic pain requiring opiods  - Diabetes  - Coronary Artery Disease  - Heart failure  - Heart attack  - Stroke  - DVT/VTE  - Cardiac arrhythmia  - Respiratory Failure/COPD  - Renal failure  - Anemia  - Advanced Liver disease      [1]  Allergies Allergen Reactions   Shellfish Allergy     DENIES ALLERGY TO SHELLFISH NKDA

## 2024-03-05 NOTE — Anesthesia Procedure Notes (Signed)
 Spinal  Patient location during procedure: OR Start time: 03/05/2024 8:35 AM End time: 03/05/2024 8:39 AM Reason for block: surgical anesthesia  Staffing Performed: resident/CRNA  Authorized by: Jerrye Sharper, MD   Performed by: Franchot Delon RAMAN, CRNA  Preanesthetic Checklist Completed: patient identified, IV checked, site marked, risks and benefits discussed, surgical consent, monitors and equipment checked, pre-op evaluation and timeout performed Spinal Block Patient position: sitting Prep: DuraPrep Patient monitoring: heart rate, cardiac monitor, continuous pulse ox and blood pressure Approach: midline Location: L3-4 Injection technique: single-shot Needle Needle type: Sprotte  Needle gauge: 24 G Needle length: 9 cm Assessment Sensory level: T4 Events: CSF return and second provider  Additional Notes Pt with monitors, O2 on. Pt to sitting position, sterile prep and drape. IV sedation, but pt arousable throughout procedure. 1 attempt at L4-5, unsuccessful. L3-4 with clear CSF, no paresthesia or blood. [place] SPEAK

## 2024-03-05 NOTE — Anesthesia Postprocedure Evaluation (Signed)
"   Anesthesia Post Note  Patient: Christie Fox  Procedure(s) Performed: ARTHROPLASTY, KNEE, TOTAL (Left: Knee)     Patient location during evaluation: PACU Anesthesia Type: Spinal Level of consciousness: oriented and awake and alert Pain management: pain level controlled Vital Signs Assessment: post-procedure vital signs reviewed and stable Respiratory status: spontaneous breathing, respiratory function stable and nonlabored ventilation Cardiovascular status: blood pressure returned to baseline and stable Postop Assessment: no headache, no backache, no apparent nausea or vomiting, spinal receding and patient able to bend at knees Anesthetic complications: no   No notable events documented.  Last Vitals:  Vitals:   03/05/24 1145 03/05/24 1200  BP: 116/86 116/71  Pulse: 71 66  Resp: 16 17  Temp:    SpO2: 96% 95%    Last Pain:  Vitals:   03/05/24 1212  TempSrc:   PainSc: 5                  Josejulian Tarango A.      "

## 2024-03-05 NOTE — Brief Op Note (Signed)
 03/05/2024  8:30 AM  8:22 AM  PATIENT:  Christie Fox  69 y.o. female  PRE-OPERATIVE DIAGNOSIS:  left knee osteoarthritis  POST-OPERATIVE DIAGNOSIS:  * No post-op diagnosis entered * The aquamantis was utilized for this case to help facilitate better hemostasis as patient was felt to be at increased risk of bleeding because of complex case requiring increased OR time and/or exposure. PROCEDURE:  Procedures: ARTHROPLASTY, KNEE, TOTAL (Left)  SURGEON:  Surgeons and Role:    * Duwayne Purchase, MD - Primary  PHYSICIAN ASSISTANT:   ASSISTANTS: Bissell   ANESTHESIA:   spinal  EBL:  50   BLOOD ADMINISTERED:none  DRAINS: none   LOCAL MEDICATIONS USED:  MARCAINE      SPECIMEN:  No Specimen  DISPOSITION OF SPECIMEN:  N/A  COUNTS:  YES  TOURNIQUET:  DICTATION: .Other Dictation: Dictation Number [id number]  PLAN OF CARE: Admit for overnight observation  PATIENT DISPOSITION:  PACU - hemodynamically stable.   Delay start of Pharmacological VTE agent (>24hrs) due to surgical blood loss or risk of bleeding: no

## 2024-03-05 NOTE — Evaluation (Signed)
 Physical Therapy Evaluation Patient Details Name: Christie Fox MRN: [id number] DOB: 24-Feb-1955 Today's Date: 03/05/2024  History of Present Illness  Pt is 69 yo female admitted on 03/05/24 for L TKA.  Pt with hx including but not limited to HTN, HLD, OA, breast CA, ACDF (3 months ago), ORIF L ankle, breast lumpectomy  Clinical Impression  Pt is s/p TKA resulting in the deficits listed below (see PT Problem List). At baseline, pt independent with ADLs and ambulatory with RW for short community distances due to knee arthritis.  She has support and DME with only 1 step to enter home with a grab bar.  Today, pt motivated to work with therapy and with good ROM and quad activation.  She does report pain at 9/10 but no signs of severe pain.  She was able to ambulate 71' with RW and CGA.  Expected to progress well with therapy. Pt will benefit from acute skilled PT to increase their independence and safety with mobility to allow discharge.          If plan is discharge home, recommend the following: A little help with walking and/or transfers;A little help with bathing/dressing/bathroom;Help with stairs or ramp for entrance;Assistance with cooking/housework   Can travel by private vehicle        Equipment Recommendations None recommended by PT  Recommendations for Other Services       Functional Status Assessment Patient has had a recent decline in their functional status and demonstrates the ability to make significant improvements in function in a reasonable and predictable amount of time.     Precautions / Restrictions Precautions Precautions: Fall;Knee Required Braces or Orthoses: Knee Immobilizer - Left Knee Immobilizer - Left: Discontinue once straight leg raise with < 10 degree lag Restrictions Weight Bearing Restrictions Per Provider Order: Yes LLE Weight Bearing Per Provider Order: Weight bearing as tolerated      Mobility  Bed Mobility Overal bed mobility: Needs  Assistance Bed Mobility: Supine to Sit     Supine to sit: Contact guard          Transfers Overall transfer level: Needs assistance Equipment used: Rolling walker (2 wheels) Transfers: Sit to/from Stand Sit to Stand: Min assist           General transfer comment: light min A and cues for hand placement    Ambulation/Gait Ambulation/Gait assistance: Contact guard assist Gait Distance (Feet): 60 Feet Assistive device: Rolling walker (2 wheels) Gait Pattern/deviations: Step-to pattern, Decreased stride length, Decreased weight shift to left Gait velocity: decreased     General Gait Details: Educated on sequencing; initially tolerating well but with fatigue L knee with slight buckling/weakness  Stairs            Wheelchair Mobility     Tilt Bed    Modified Rankin (Stroke Patients Only)       Balance Overall balance assessment: Needs assistance Sitting-balance support: No upper extremity supported Sitting balance-Leahy Scale: Good     Standing balance support: Bilateral upper extremity supported, Reliant on assistive device for balance Standing balance-Leahy Scale: Poor Standing balance comment: steady with RW                             Pertinent Vitals/Pain Pain Assessment Pain Assessment: 0-10 Pain Score: 9  Pain Location: L knee Pain Descriptors / Indicators: Discomfort, Nagging Pain Intervention(s): Limited activity within patient's tolerance, Monitored during session, Premedicated before session, Repositioned, Patient requesting  pain meds-RN notified, Ice applied, Other (comment) (Pt with c/o 9/10 pain ; however, able to mobilize and no signs/symptoms of severe pain)    Home Living Family/patient expects to be discharged to:: Private residence Living Arrangements: Spouse/significant other Available Help at Discharge: Family;Available 24 hours/day Type of Home: House Home Access: Stairs to enter Entrance Stairs-Rails:  (Grab bar  on R side) Entrance Stairs-Number of Steps: 1   Home Layout: One level Home Equipment: Agricultural Consultant (2 wheels);Shower seat;Grab bars - tub/shower      Prior Function Prior Level of Function : Independent/Modified Independent             Mobility Comments: Used RW most of the time prior to surgery; Short Community Distance for ambulation ADLs Comments: Independent with ADLs; could do light iadls     Extremity/Trunk Assessment   Upper Extremity Assessment Upper Extremity Assessment: RUE deficits/detail;LUE deficits/detail RUE Deficits / Details: Pt with recent ACDF and reports still has some numbness in digits 1-3 , pins and needle sensations in hands, and difficulty with fine motor/coordination/holding items.  ROM was [facility] and MMT grossly 4/5 throughout UE LUE Deficits / Details: Pt with recent ACDF and reports still has some numbness in digits 1-3 , pins and needle sensations in hands, and difficulty with fine motor/coordination/holding items. ROM was [facility] and MMT grossly 4/5 throughout UE    Lower Extremity Assessment Lower Extremity Assessment: LLE deficits/detail;RLE deficits/detail RLE Deficits / Details: ROM WFL; MMT 5/5 LLE Deficits / Details: Expected post op changes; ROM Knee 5 to 75 degrees; MMT: ankle 5/5, knee and hip 3/5 without extensor lag    Cervical / Trunk Assessment Cervical / Trunk Assessment: Normal  Communication        Cognition Arousal: Alert Behavior During Therapy: WFL for tasks assessed/performed   PT - Cognitive impairments: No apparent impairments                                 Cueing       General Comments General comments (skin integrity, edema, etc.): Pt was on 2 L - spouse reports O2 was dropping when falling asleep after taking meds.  REmoved during PT and maintained 94% but placed back on O2 at rest    Exercises     Assessment/Plan    PT Assessment Patient needs continued PT services  PT Problem List Decreased  strength;Decreased range of motion;Decreased activity tolerance;Decreased balance;Decreased mobility;Pain;Decreased knowledge of use of DME       PT Treatment Interventions DME instruction;Therapeutic exercise;Gait training;Stair training;Functional mobility training;Therapeutic activities;Patient/family education;Modalities;Balance training    PT Goals (Current goals can be found in the Care Plan section)  Acute Rehab PT Goals Patient Stated Goal: return home PT Goal Formulation: With patient/family Time For Goal Achievement: 03/19/24 Potential to Achieve Goals: Good    Frequency 7X/week     Co-evaluation               AM-PAC PT 6 Clicks Mobility  Outcome Measure Help needed turning from your back to your side while in a flat bed without using bedrails?: A Little Help needed moving from lying on your back to sitting on the side of a flat bed without using bedrails?: A Little Help needed moving to and from a bed to a chair (including a wheelchair)?: A Little Help needed standing up from a chair using your arms (e.g., wheelchair or bedside chair)?: A Little Help needed  to walk in hospital room?: A Little Help needed climbing 3-5 steps with a railing? : A Little 6 Click Score: 18    End of Session Equipment Utilized During Treatment: Gait belt Activity Tolerance: Patient tolerated treatment well Patient left: with chair alarm set;in chair;with call bell/phone within reach;with SCD's reapplied;with family/visitor present Nurse Communication: Mobility status PT Visit Diagnosis: Other abnormalities of gait and mobility (R26.89);Muscle weakness (generalized) (M62.81)    Time: 8384-8354 PT Time Calculation (min) (ACUTE ONLY): 30 min   Charges:   PT Evaluation $PT Eval Low Complexity: 1 Low PT Treatments $Gait Training: 8-22 mins PT General Charges $$ ACUTE PT VISIT: 1 Visit         Benjiman, PT Acute Rehab [facility] Rehab [phone]   Benjiman VEAR [place]berry 03/05/2024, 5:39 PM

## 2024-03-05 NOTE — Transfer of Care (Signed)
 Immediate Anesthesia Transfer of Care Note  Patient: Christie Fox  Procedure(s) Performed: ARTHROPLASTY, KNEE, TOTAL (Left: Knee)  Patient Location: PACU  Anesthesia Type:Spinal  Level of Consciousness: drowsy and patient cooperative  Airway & Oxygen Therapy: Patient Spontanous Breathing and Patient connected to face mask oxygen  Post-op Assessment: Report given to RN and Post -op Vital signs reviewed and stable  Post vital signs: Reviewed and stable  Last Vitals:  Vitals Value Taken Time  BP 115/65 03/05/24 11:02  Temp    Pulse 73 03/05/24 11:05  Resp 14 03/05/24 11:05  SpO2 96 % 03/05/24 11:05  Vitals shown include unfiled device data.  Last Pain:  Vitals:   03/05/24 0736  TempSrc: Oral         Complications: No notable events documented.

## 2024-03-05 NOTE — Anesthesia Procedure Notes (Addendum)
" °  Anesthesia Regional Block: Adductor canal block   Pre-Anesthetic Checklist: , timeout performed,  Correct Patient, Correct Site, Correct Laterality,  Correct Procedure, Correct Position, site marked,  Risks and benefits discussed,  Surgical consent,  Pre-op evaluation,  At surgeon's request and post-op pain management  Laterality: Left  Prep: chloraprep       Needles:  Injection technique: Single-shot  Needle Type: Echogenic Stimulator Needle     Needle Length: 10cm  Needle Gauge: 21   Needle insertion depth (cm): 8   Additional Needles:   Procedures:,,,, ultrasound used (permanent image in chart),,    Narrative:  Start time: 03/05/2024 8:15 AM End time: 03/05/2024 8:20 AM Injection made incrementally with aspirations every 5 mL. Anesthesiologist: Jerrye Sharper, MD  Additional Notes: Timeout performed. Patient sedated. Relevant anatomy ID'd using US . Incremental 2-5ml injection of LA with frequent aspiration. Patient tolerated procedure well.      "

## 2024-03-05 NOTE — Plan of Care (Signed)
" °  Problem: Education: Goal: Knowledge of General Education information will improve Description: Including pain rating scale, medication(s)/side effects and non-pharmacologic comfort measures Outcome: Progressing   Problem: Health Behavior/Discharge Planning: Goal: Ability to manage health-related needs will improve Outcome: Progressing   Problem: Clinical Measurements: Goal: Ability to maintain clinical measurements within normal limits will improve Outcome: Progressing Goal: Will remain free from infection Outcome: Progressing Goal: Diagnostic test results will improve Outcome: Progressing Goal: Respiratory complications will improve Outcome: Progressing Goal: Cardiovascular complication will be avoided Outcome: Progressing   Problem: Activity: Goal: Risk for activity intolerance will decrease Outcome: Progressing   Problem: Nutrition: Goal: Adequate nutrition will be maintained Outcome: Progressing   Problem: Coping: Goal: Level of anxiety will decrease Outcome: Progressing   Problem: Elimination: Goal: Will not experience complications related to bowel motility Outcome: Progressing Goal: Will not experience complications related to urinary retention Outcome: Progressing   Problem: Pain Managment: Goal: General experience of comfort will improve and/or be controlled Outcome: Progressing   Problem: Safety: Goal: Ability to remain free from injury will improve Outcome: Progressing   Problem: Skin Integrity: Goal: Risk for impaired skin integrity will decrease Outcome: Progressing   Problem: Education: Goal: Knowledge of the prescribed therapeutic regimen will improve Outcome: Progressing   Problem: Bowel/Gastric: Goal: Gastrointestinal status for postoperative course will improve Outcome: Progressing   Problem: Cardiac: Goal: Ability to maintain an adequate cardiac output Outcome: Progressing Goal: Will show no evidence of cardiac arrhythmias Outcome:  Progressing   Problem: Nutritional: Goal: Will attain and maintain optimal nutritional status Outcome: Progressing   Problem: Neurological: Goal: Will regain or maintain usual level of consciousness Outcome: Progressing   Problem: Clinical Measurements: Goal: Ability to maintain clinical measurements within normal limits Outcome: Progressing Goal: Postoperative complications will be avoided or minimized Outcome: Progressing   Problem: Respiratory: Goal: Will regain and/or maintain adequate ventilation Outcome: Progressing Goal: Respiratory status will improve Outcome: Progressing   Problem: Skin Integrity: Goal: Demonstrates signs of wound healing without infection Outcome: Progressing   Problem: Urinary Elimination: Goal: Will remain free from infection Outcome: Progressing Goal: Ability to achieve and maintain adequate urine output Outcome: Progressing   Problem: Education: Goal: Knowledge of the prescribed therapeutic regimen will improve Outcome: Progressing Goal: Individualized Educational Video(s) Outcome: Progressing   Problem: Activity: Goal: Ability to avoid complications of mobility impairment will improve Outcome: Progressing Goal: Range of joint motion will improve Outcome: Progressing   Problem: Clinical Measurements: Goal: Postoperative complications will be avoided or minimized Outcome: Progressing   Problem: Pain Management: Goal: Pain level will decrease with appropriate interventions Outcome: Progressing   Problem: Skin Integrity: Goal: Will show signs of wound healing Outcome: Progressing   "

## 2024-03-05 NOTE — Op Note (Signed)
 NAME: Christie Fox, Christie Fox MEDICAL RECORD NO: [id number] ACCOUNT NO: [id number] DATE OF BIRTH: 06-02-1955 FACILITY: [facility] LOCATION: [facility] PHYSICIAN: Reyes KYM Billing, MD  Operative Report   DATE OF PROCEDURE: 03/05/2024  PREOPERATIVE DIAGNOSIS:  End-stage osteoarthrosis varus deformity, left knee.  POSTOPERATIVE DIAGNOSES:  End-stage osteoarthrosis varus deformity, left knee.  PROCEDURE PERFORMED:  Left total knee arthroplasty utilizing an Attune rotating platform, 4 femur, 4 tibia, 8 mm insert, 32 patella.  ANESTHESIA:  Spinal.  ASSISTANT:  Jaclyn Bissell, PA.  HISTORY:  A 69 year old with end-stage osteoarthrosis, medial compartment varus deformity, indicated for replacement of the degenerated joint.  Risks and benefits were discussed including bleeding, infection, damage to neurovascular structures, no change  in symptoms, worsening symptoms, DVT and PE, anesthetic complications, etc.  TECHNIQUE:  With the patient in the supine position, after induction of adequate spinal anesthesia, 2 g of Kefzol  were administered.  The left lower extremity was prepped and draped in the usual sterile fashion.  A thigh tourniquet was inflated to 225  mmHg.  A midline incision was then made in the knee.  Full-thickness flaps were developed.  A medial parapatellar arthrotomy was performed, and soft tissues were elevated medially, preserving the MCL.  The patella was gently everted, and the knee was  flexed.  Tricompartmental osteoarthrosis, particularly bone-on-bone in the medial compartment, was noted.  Osteophytes were removed with a rongeur.  Remnants of the lateral meniscus were excised.  The ACL was excised.  A notchplasty of the femoral notch  was performed as a starting point for the femoral drill, which was drilled in line with the femur, entering the IM canal without difficulty and confirmed with a T-handle.  After irrigating, a prior intramedullary guide, 5-degree left, was selected with 9  off  the distal femur, pinned, and a distal femoral cut was performed.  We sized the femur off the anterior cortex, which would be a 4, and used a block with 3 degrees of external rotation.  It was pinned coplanar with the transepicondylar axis.  We  pinned and performed anterior, posterior, and chamfer cuts, checked, protecting soft tissues medially, laterally, and posteriorly at all times.  We then subluxed the tibia.  The left side was medially.  An external alignment guide, 2 off the defect,  which was 8 off the lateral side, bisecting the tibial joint, 3 degrees slope parallel to the shaft, was pinned, and I performed the cut.  We then checked our extension block, which was hyper at 5 mm, 5 mm, better at 7 mm.  The knee was then reflexed,  and the tibia was subluxed and sized to a 4, maximizing coverage to the medial third of the tibial tubercle.  We pinned and harvested bone, which was essentially impacted in the distal femur.  A central drill punch guide was tensioned back to the femur  for the box cut jig, bisecting the canal and the condyles.  We pinned, and I performed our box cut.  We then placed the trial femur, which fit flush, drilled our lug holes, placed a 7 mm insert, and reduced it.  I had very slight hyperextension, full  flexion, good stability to varus and valgus stress from 0-30 degrees, and a negative anterior drawer.  We everted the patella, measured it to a 23, and planed it to a 13.5 after removing osteophytes.  We sized to a 32 with a paddle parallel to the joint  surface, drilling our lug holes and drilling our peg holes, placing the  trial patella, reducing it, and had excellent patellofemoral tracking.  Then, all instrumentation was removed.  We checked posteriorly; the popliteus tendon and the capsule were  intact.  The Aquamantys was used to cauterize our geniculates.  Copious irrigation with pulsatile lavage was performed.  We flexed the knee, and all surfaces were thoroughly dried.   Drill holes were placed in the proximal tibia due to eburnated bone.   Cement was mixed on the back table under centrifuge and vacuum.  I then placed cement in the proximal tibia, digitally pressurizing it.  Cement was placed on the tibial tray, impacting it into place, and redundant cement was removed.  Cement was placed  on the femoral component and the femur and impacted into place.  It fit flush.  We placed an 8 mm insert and then reduced it to full extension, holding an axial load throughout the curing of the cement.  We cemented the patella.  Marcaine  with  epinephrine  and Prontosan was placed, covering the wound throughout the curing of the cement.  It cured, and at 65 minutes, the tourniquet was deflated.  Any bleeding was cauterized with the Aquamantys.  I had full extension, full flexion, good stability  to varus and valgus stress from 0-30 degrees, and a negative anterior drawer.  We removed the trial insert and meticulously removed all redundant cement.  Copious irrigation with pulsatile lavage was performed, followed by Prontosan, and then we placed  the permanent 8 mm insert, reduced it, and had full extension, full flexion, and excellent stability.  I performed a lateral release with the synovium intact.  I had excellent patellofemoral tracking.  There was a negative anterior drawer in midflexion.   I reapproximated the patellar arthrotomy with 1 Vicryl in figure-of-eight sutures, followed by a running Stratafix.  There was excellent patellofemoral tracking in motion and stability following this.  We copiously irrigated the subcutaneous tissue,  which was closed in multiple layers of 2-0, and the skin with staples.  The wound was dressed sterilely, placed in an immobilizer, and the patient was transferred to the recovery room in satisfactory condition.  The patient tolerated the procedure well with no complications.  Assistant Jaclyn Bissell, [place], was used throughout the case for patient  positioning, traction, and closure.  TOURNIQUET TIME:  65 minutes.  BLOOD LOSS:  50 mL.     D: 03/05/2024 10:55:28 am T: 03/05/2024 11:04:00 am  JOB: [id number]/ [id number]

## 2024-03-05 NOTE — Anesthesia Procedure Notes (Signed)
 Procedure Name: MAC Date/Time: 03/05/2024 8:28 AM  Performed by: Franchot Delon RAMAN, CRNAPre-anesthesia Checklist: Patient identified, Emergency Drugs available, Suction available and Patient being monitored Oxygen Delivery Method: Simple face mask Ventilation: Oral airway inserted - appropriate to patient size Placement Confirmation: positive ETCO2 Dental Injury: Teeth and Oropharynx as per pre-operative assessment

## 2024-03-06 ENCOUNTER — Encounter (HOSPITAL_COMMUNITY): Payer: Self-pay | Admitting: Specialist

## 2024-03-06 LAB — CBC
HCT: 39 % (ref 36.0–46.0)
Hemoglobin: 12.5 g/dL (ref 12.0–15.0)
MCH: 29.2 pg (ref 26.0–34.0)
MCHC: 32.1 g/dL (ref 30.0–36.0)
MCV: 91.1 fL (ref 80.0–100.0)
Platelets: 279 10*3/uL (ref 150–400)
RBC: 4.28 MIL/uL (ref 3.87–5.11)
RDW: 13.4 % (ref 11.5–15.5)
WBC: 8.9 10*3/uL (ref 4.0–10.5)
nRBC: 0 % (ref 0.0–0.2)

## 2024-03-06 LAB — BASIC METABOLIC PANEL WITH GFR
Anion gap: 12 (ref 5–15)
BUN: 16 mg/dL (ref 8–23)
CO2: 26 mmol/L (ref 22–32)
Calcium: 9 mg/dL (ref 8.9–10.3)
Chloride: 97 mmol/L — ABNORMAL LOW (ref 98–111)
Creatinine, Ser: 0.9 mg/dL (ref 0.44–1.00)
GFR, Estimated: >60 mL/min
Glucose, Bld: 156 mg/dL — ABNORMAL HIGH (ref 70–99)
Potassium: 4.5 mmol/L (ref 3.5–5.1)
Sodium: 135 mmol/L (ref 135–145)

## 2024-03-06 MED ORDER — CELECOXIB 200 MG PO CAPS
200.0000 mg | ORAL_CAPSULE | Freq: Every day | ORAL | Status: DC
  Administered 2024-03-06: 200 mg via ORAL
  Filled 2024-03-06: qty 1

## 2024-03-06 MED ORDER — ASPIRIN 81 MG PO TBEC: 81.0000 mg | DELAYED_RELEASE_TABLET | Freq: Two times a day (BID) | ORAL | 1 refills | Status: AC

## 2024-03-06 MED ORDER — HYDROMORPHONE HCL 2 MG PO TABS
2.0000 mg | ORAL_TABLET | ORAL | Status: DC | PRN
  Administered 2024-03-06: 2 mg via ORAL
  Filled 2024-03-06: qty 1

## 2024-03-06 MED ORDER — DOCUSATE SODIUM 100 MG PO CAPS: 100.0000 mg | ORAL_CAPSULE | Freq: Two times a day (BID) | ORAL | 2 refills | Status: AC

## 2024-03-06 MED ORDER — OXYCODONE HCL 10 MG PO TABS: 10.0000 mg | ORAL_TABLET | ORAL | 0 refills | Status: AC | PRN

## 2024-03-06 MED ORDER — CELECOXIB 200 MG PO CAPS: 200.0000 mg | ORAL_CAPSULE | Freq: Every day | ORAL | 1 refills | Status: AC

## 2024-03-06 MED ORDER — TIZANIDINE HCL 4 MG PO CAPS: 4.0000 mg | ORAL_CAPSULE | Freq: Three times a day (TID) | ORAL | 1 refills | Status: AC | PRN

## 2024-03-06 MED ORDER — POLYETHYLENE GLYCOL 3350 17 G PO PACK: 17.0000 g | PACK | Freq: Every day | ORAL | 0 refills | Status: AC

## 2024-03-06 NOTE — TOC Transition Note (Signed)
 Transition of Care [facility]) - Discharge Note   Patient Details  Name: Christie Fox MRN: [id number] Date of Birth: [birth year]  [facility] CM/SW Contact:  NORMAN ASPEN, LCSW Phone Number: 03/06/2024, 12:57 PM   Clinical Narrative:     Met with pt and spouse today who confirm pt has needed DME in the home.  Pt aware that HHPT had been prearranged (with [facility]), however, she prefers OPPT at [facility].  Pt had told MD so he is aware.  Confirmed with MD that OPPT is being set up as pt requested.  Have alerted [facility] agency of change in plan.  No further IP CM needs.   Final next level of care: OP Rehab Barriers to Discharge: No Barriers Identified   Patient Goals and CMS Choice Patient states their goals for this hospitalization and ongoing recovery are:: return home          Discharge Placement                       Discharge Plan and Services Additional resources added to the After Visit Summary for                  DME Arranged: N/A DME Agency: NA                  Social Drivers of Health (SDOH) Interventions SDOH Screenings   Food Insecurity: No Food Insecurity (03/05/2024)  Housing: Low Risk (03/05/2024)  Transportation Needs: No Transportation Needs (03/05/2024)  Utilities: Not At Risk (03/05/2024)  Financial Resource Strain: Low Risk (01/04/2022)  Social Connections: Unknown (03/05/2024)  Tobacco Use: Low Risk (03/04/2024)     Readmission Risk Interventions     No data to display

## 2024-03-06 NOTE — Progress Notes (Addendum)
 Subjective: 1 Day Post-Op Procedures (LRB): ARTHROPLASTY, KNEE, TOTAL (Left) Patient reports pain as 5 on 0-10 scale.   Denies CP or SOB.  Voiding without difficulty. Positive flatus. Taking oxy 10 better pain control. Alert appropriate Objective: Vital signs in last 24 hours: Temp:  [97.3 F (36.3 C)-97.8 F (36.6 C)] 97.3 F (36.3 C) (02/05 0519) Pulse Rate:  [59-91] 69 (02/05 0519) Resp:  [13-18] 17 (02/05 0519) BP: (102-133)/(65-92) 104/68 (02/05 0519) SpO2:  [92 %-99 %] 95 % (02/05 0519) Weight:  [98.4 kg] 98.4 kg (02/05 0214)  Intake/Output from previous day: 02/04 0701 - 02/05 0700 In: 3587.7 [P.O.:1320; I.V.:1857.3; IV Piggyback:410.4] Out: 2125 [Urine:2100; Blood:25] Intake/Output this shift: No intake/output data recorded.  Recent Labs    03/06/24 0325  HGB 12.5   Recent Labs    03/06/24 0325  WBC 8.9  RBC 4.28  HCT 39.0  PLT 279   Recent Labs    03/06/24 0325  NA 135  K 4.5  CL 97*  CO2 26  BUN 16  CREATININE 0.90  GLUCOSE 156*  CALCIUM  9.0   No results for input(s): LABPT, INR in the last 72 hours.  Neurologically intact Neurovascular intact Sensation intact distally Intact pulses distally Dorsiflexion/Plantar flexion intact Incision: dressing C/D/I Compartment soft No DVT  Assessment/Plan:  1 Day Post-Op Procedures (LRB): ARTHROPLASTY, KNEE, TOTAL (Left) Advance diet Discharge home with home health Will take pepci 20mg  BID for a week   Principal Problem:   Left knee DJD      Christie Fox Billing 03/06/2024, @NOW 

## 2024-03-06 NOTE — Progress Notes (Signed)
 Physical Therapy Treatment Patient Details Name: Christie Fox MRN: [id number] DOB: 15-Oct-1955 Today's Date: 03/06/2024   History of Present Illness Pt is 69 yo female admitted on 03/05/24 for L TKA.  Pt with hx including but not limited to HTN, HLD, OA, breast CA, ACDF (3 months ago), ORIF L ankle, breast lumpectomy    PT Comments  Pt motivated and progressing well despite report of significant pain.  HEP initiated and pt up to ambulate increased distance in hall with noted improvement in stability.  Pt hopeful for dc home this date.    If plan is discharge home, recommend the following: A little help with walking and/or transfers;A little help with bathing/dressing/bathroom;Help with stairs or ramp for entrance;Assistance with cooking/housework   Can travel by private vehicle        Equipment Recommendations  None recommended by PT    Recommendations for Other Services       Precautions / Restrictions Precautions Precautions: Fall;Knee Required Braces or Orthoses: Knee Immobilizer - Left Knee Immobilizer - Left: Discontinue once straight leg raise with < 10 degree lag (Pt performed IND SLR this am) Restrictions Weight Bearing Restrictions Per Provider Order: Yes LLE Weight Bearing Per Provider Order: Weight bearing as tolerated     Mobility  Bed Mobility Overal bed mobility: Needs Assistance Bed Mobility: Supine to Sit     Supine to sit: Supervision     General bed mobility comments: for safety    Transfers Overall transfer level: Needs assistance Equipment used: Rolling walker (2 wheels) Transfers: Sit to/from Stand Sit to Stand: Contact guard assist           General transfer comment: cues for hand placement and LE management    Ambulation/Gait Ambulation/Gait assistance: Contact guard assist Gait Distance (Feet): 125 Feet Assistive device: Rolling walker (2 wheels) Gait Pattern/deviations: Step-to pattern, Decreased stride length, Decreased weight shift  to left Gait velocity: decreased     General Gait Details: min cues for sequene, posture and position from [employer]             Wheelchair Mobility     Tilt Bed    Modified Rankin (Stroke Patients Only)       Balance Overall balance assessment: Needs assistance Sitting-balance support: No upper extremity supported Sitting balance-Leahy Scale: Good     Standing balance support: Single extremity supported Standing balance-Leahy Scale: Poor Standing balance comment: steady with RW                            Communication Communication Communication: No apparent difficulties  Cognition Arousal: Alert Behavior During Therapy: WFL for tasks assessed/performed   PT - Cognitive impairments: No apparent impairments                         Following commands: Intact      Cueing Cueing Techniques: Verbal cues  Exercises Total Joint Exercises Ankle Circles/Pumps: AROM, Both, 15 reps, Supine Quad Sets: AROM, Both, 10 reps, Supine Heel Slides: AAROM, 15 reps, Supine, Left Straight Leg Raises: AAROM, AROM, Left, 15 reps, Supine    General Comments        Pertinent Vitals/Pain Pain Assessment Pain Assessment: 0-10 Pain Score: 9  Pain Location: L knee Pain Descriptors / Indicators: Aching, Sore Pain Intervention(s): Limited activity within patient's tolerance, Monitored during session, Premedicated before session, Ice applied    Home Living  Prior Function            PT Goals (current goals can now be found in the care plan section) Acute Rehab PT Goals Patient Stated Goal: return home PT Goal Formulation: With patient/family Time For Goal Achievement: 03/19/24 Potential to Achieve Goals: Good Progress towards PT goals: Progressing toward goals    Frequency    7X/week      PT Plan      Co-evaluation              AM-PAC PT 6 Clicks Mobility   Outcome Measure  Help needed  turning from your back to your side while in a flat bed without using bedrails?: A Little Help needed moving from lying on your back to sitting on the side of a flat bed without using bedrails?: A Little Help needed moving to and from a bed to a chair (including a wheelchair)?: A Little Help needed standing up from a chair using your arms (e.g., wheelchair or bedside chair)?: A Little Help needed to walk in hospital room?: A Little Help needed climbing 3-5 steps with a railing? : A Little 6 Click Score: 18    End of Session Equipment Utilized During Treatment: Gait belt Activity Tolerance: Patient tolerated treatment well Patient left: with chair alarm set;in chair;with call bell/phone within reach;with SCD's reapplied;with family/visitor present Nurse Communication: Mobility status PT Visit Diagnosis: Other abnormalities of gait and mobility (R26.89);Muscle weakness (generalized) (M62.81)     Time: 9173-9084 PT Time Calculation (min) (ACUTE ONLY): 49 min  Charges:    $Gait Training: 8-22 mins $Therapeutic Exercise: 8-22 mins $Therapeutic Activity: 8-22 mins PT General Charges $$ ACUTE PT VISIT: 1 Visit                     [facility] PT Acute Rehabilitation Services Office [phone]    Omarii Scalzo 03/06/2024, 12:19 PM

## 2024-03-06 NOTE — Progress Notes (Signed)
 Physical Therapy Treatment Patient Details Name: Christie Fox MRN: [id number] DOB: [birth year] Today's Date: 03/06/2024   History of Present Illness Pt is 69 yo female admitted on 03/05/24 for L TKA.  Pt with hx including but not limited to HTN, HLD, OA, breast CA, ACDF (3 months ago), ORIF L ankle, breast lumpectomy    PT Comments  Pt continues motivated and progressing well with mobility.  Pt up to ambulate in hall, negotiated stairs and performed HEP with written instruction provided and reviewed.  Spouse present for session.  Pt eager for dc home this date.    If plan is discharge home, recommend the following: A little help with walking and/or transfers;A little help with bathing/dressing/bathroom;Help with stairs or ramp for entrance;Assistance with cooking/housework   Can travel by private vehicle        Equipment Recommendations  None recommended by PT    Recommendations for Other Services       Precautions / Restrictions Precautions Precautions: Fall;Knee Required Braces or Orthoses: Knee Immobilizer - Left Knee Immobilizer - Left: Discontinue once straight leg raise with < 10 degree lag Restrictions Weight Bearing Restrictions Per Provider Order: Yes LLE Weight Bearing Per Provider Order: Weight bearing as tolerated     Mobility  Bed Mobility Overal bed mobility: Needs Assistance Bed Mobility: Supine to Sit     Supine to sit: Supervision     General bed mobility comments: for safety    Transfers Overall transfer level: Needs assistance Equipment used: Rolling walker (2 wheels) Transfers: Sit to/from Stand Sit to Stand: Contact guard assist, Supervision           General transfer comment: cues for hand placement and LE management    Ambulation/Gait Ambulation/Gait assistance: Contact guard assist, Supervision Gait Distance (Feet): 80 Feet Assistive device: Rolling walker (2 wheels) Gait Pattern/deviations: Step-to pattern, Decreased stride length,  Decreased weight shift to left Gait velocity: decreased     General Gait Details: min cues for sequence, posture and position from RW   Stairs Stairs: Yes Stairs assistance: Min assist Stair Management: No rails, Step to pattern, Forwards, Backwards, With walker Number of Stairs: 4 General stair comments: single step fwd and bkwd with cues for sequence   Wheelchair Mobility     Tilt Bed    Modified Rankin (Stroke Patients Only)       Balance Overall balance assessment: Needs assistance Sitting-balance support: No upper extremity supported Sitting balance-Leahy Scale: Good     Standing balance support: Single extremity supported Standing balance-Leahy Scale: Poor Standing balance comment: steady with RW                            Communication Communication Communication: No apparent difficulties  Cognition Arousal: Alert Behavior During Therapy: WFL for tasks assessed/performed   PT - Cognitive impairments: No apparent impairments                         Following commands: Intact      Cueing Cueing Techniques: Verbal cues  Exercises Total Joint Exercises Ankle Circles/Pumps: AROM, Both, 15 reps, Supine Quad Sets: AROM, Both, 10 reps, Supine Heel Slides: AAROM, 15 reps, Supine, Left Straight Leg Raises: AAROM, AROM, Left, 15 reps, Supine Long Arc Quad: AAROM, Left, 10 reps, Seated    General Comments        Pertinent Vitals/Pain Pain Assessment Pain Assessment: No/denies pain Pain Score: 5  Pain  Location: L knee Pain Descriptors / Indicators: Aching, Sore Pain Intervention(s): Limited activity within patient's tolerance, Monitored during session, Premedicated before session    Home Living                          Prior Function            PT Goals (current goals can now be found in the care plan section) Acute Rehab PT Goals Patient Stated Goal: return home PT Goal Formulation: With patient/family Time For  Goal Achievement: 03/19/24 Potential to Achieve Goals: Good Progress towards PT goals: Progressing toward goals    Frequency    7X/week      PT Plan      Co-evaluation              AM-PAC PT 6 Clicks Mobility   Outcome Measure  Help needed turning from your back to your side while in a flat bed without using bedrails?: A Little Help needed moving from lying on your back to sitting on the side of a flat bed without using bedrails?: A Little Help needed moving to and from a bed to a chair (including a wheelchair)?: A Little Help needed standing up from a chair using your arms (e.g., wheelchair or bedside chair)?: A Little Help needed to walk in hospital room?: A Little Help needed climbing 3-5 steps with a railing? : A Little 6 Click Score: 18    End of Session Equipment Utilized During Treatment: Gait belt Activity Tolerance: Patient tolerated treatment well Patient left: with chair alarm set;in chair;with call bell/phone within reach;with SCD's reapplied;with family/visitor present Nurse Communication: Mobility status PT Visit Diagnosis: Other abnormalities of gait and mobility (R26.89);Muscle weakness (generalized) (M62.81)     Time: 1231-1310 PT Time Calculation (min) (ACUTE ONLY): 39 min  Charges:    $Gait Training: 8-22 mins $Therapeutic Exercise: 8-22 mins $Therapeutic Activity: 8-22 mins PT General Charges $$ ACUTE PT VISIT: 1 Visit                     [facility] PT Acute Rehabilitation Services Office [phone]    Ramsey Guadamuz 03/06/2024, 1:18 PM
